# Patient Record
Sex: Male | Born: 1975
Health system: Southern US, Community
[De-identification: ages and names within clinical notes are randomized; demographics above are authoritative.]

## PROBLEM LIST (undated history)

## (undated) DIAGNOSIS — G4730 Sleep apnea, unspecified: Secondary | ICD-10-CM

## (undated) DIAGNOSIS — E669 Obesity, unspecified: Secondary | ICD-10-CM

## (undated) DIAGNOSIS — G4733 Obstructive sleep apnea (adult) (pediatric): Secondary | ICD-10-CM

## (undated) DIAGNOSIS — T7840XA Allergy, unspecified, initial encounter: Secondary | ICD-10-CM

## (undated) DIAGNOSIS — E785 Hyperlipidemia, unspecified: Secondary | ICD-10-CM

## (undated) DIAGNOSIS — R0981 Nasal congestion: Secondary | ICD-10-CM

## (undated) DIAGNOSIS — E039 Hypothyroidism, unspecified: Secondary | ICD-10-CM

## (undated) DIAGNOSIS — F329 Major depressive disorder, single episode, unspecified: Secondary | ICD-10-CM

## (undated) DIAGNOSIS — F32A Depression, unspecified: Secondary | ICD-10-CM

## (undated) DIAGNOSIS — F419 Anxiety disorder, unspecified: Secondary | ICD-10-CM

## (undated) DIAGNOSIS — I10 Essential (primary) hypertension: Secondary | ICD-10-CM

## (undated) DIAGNOSIS — E119 Type 2 diabetes mellitus without complications: Secondary | ICD-10-CM

## (undated) HISTORY — DX: Major depressive disorder, single episode, unspecified: F32.9

## (undated) HISTORY — DX: Sleep apnea, unspecified: G47.30

## (undated) HISTORY — DX: Anxiety disorder, unspecified: F41.9

## (undated) HISTORY — PX: FRACTURE SURGERY: SHX138

## (undated) HISTORY — DX: Depression, unspecified: F32.A

## (undated) HISTORY — DX: Hyperlipidemia, unspecified: E78.5

## (undated) HISTORY — DX: Essential (primary) hypertension: I10

## (undated) HISTORY — DX: Obesity, unspecified: E66.9

## (undated) HISTORY — DX: Obstructive sleep apnea (adult) (pediatric): G47.33

## (undated) HISTORY — PX: UVULOPALATOPHARYNGOPLASTY (UPPP)/TONSILLECTOMY/SEPTOPLASTY: SHX6164

## (undated) HISTORY — DX: Allergy, unspecified, initial encounter: T78.40XA

## (undated) HISTORY — DX: Hypothyroidism, unspecified: E03.9

## (undated) HISTORY — DX: Nasal congestion: R09.81

---
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
## 1999-09-22 ENCOUNTER — Encounter: Admission: RE | Admit: 1999-09-22 | Discharge: 1999-12-21 | Payer: Self-pay | Admitting: *Deleted

## 1999-11-21 ENCOUNTER — Ambulatory Visit (HOSPITAL_COMMUNITY): Admission: RE | Admit: 1999-11-21 | Discharge: 1999-11-21 | Payer: Self-pay | Admitting: Family Medicine

## 1999-11-21 ENCOUNTER — Encounter: Payer: Self-pay | Admitting: Family Medicine

## 2001-04-25 ENCOUNTER — Emergency Department (HOSPITAL_COMMUNITY): Admission: EM | Admit: 2001-04-25 | Discharge: 2001-04-25 | Payer: Self-pay | Admitting: Emergency Medicine

## 2002-08-05 ENCOUNTER — Ambulatory Visit: Admission: RE | Admit: 2002-08-05 | Discharge: 2002-08-05 | Payer: Self-pay | Admitting: Unknown Physician Specialty

## 2004-01-27 ENCOUNTER — Ambulatory Visit: Payer: Self-pay | Admitting: Family Medicine

## 2004-02-26 ENCOUNTER — Ambulatory Visit: Payer: Self-pay | Admitting: Family Medicine

## 2004-03-10 ENCOUNTER — Ambulatory Visit: Payer: Self-pay | Admitting: *Deleted

## 2004-05-02 ENCOUNTER — Ambulatory Visit: Payer: Self-pay | Admitting: Family Medicine

## 2004-07-04 ENCOUNTER — Ambulatory Visit: Payer: Self-pay | Admitting: Family Medicine

## 2004-12-07 ENCOUNTER — Ambulatory Visit: Payer: Self-pay | Admitting: Family Medicine

## 2005-06-09 ENCOUNTER — Ambulatory Visit: Payer: Self-pay | Admitting: Family Medicine

## 2005-06-27 ENCOUNTER — Ambulatory Visit: Payer: Self-pay | Admitting: Family Medicine

## 2005-07-14 ENCOUNTER — Emergency Department (HOSPITAL_COMMUNITY): Admission: EM | Admit: 2005-07-14 | Discharge: 2005-07-14 | Payer: Self-pay | Admitting: Emergency Medicine

## 2005-08-10 ENCOUNTER — Ambulatory Visit: Payer: Self-pay | Admitting: Family Medicine

## 2005-11-29 ENCOUNTER — Emergency Department (HOSPITAL_COMMUNITY): Admission: EM | Admit: 2005-11-29 | Discharge: 2005-11-29 | Payer: Self-pay | Admitting: Emergency Medicine

## 2007-05-17 ENCOUNTER — Ambulatory Visit: Payer: Self-pay | Admitting: Cardiology

## 2008-03-13 HISTORY — PX: UVULOPALATOPHARYNGOPLASTY (UPPP)/TONSILLECTOMY/SEPTOPLASTY: SHX6164

## 2009-12-07 ENCOUNTER — Emergency Department (HOSPITAL_COMMUNITY): Admission: EM | Admit: 2009-12-07 | Discharge: 2009-12-07 | Payer: Self-pay | Admitting: Emergency Medicine

## 2009-12-08 ENCOUNTER — Emergency Department (HOSPITAL_COMMUNITY): Admission: EM | Admit: 2009-12-08 | Discharge: 2009-12-09 | Payer: Self-pay | Admitting: Emergency Medicine

## 2009-12-13 ENCOUNTER — Emergency Department (HOSPITAL_COMMUNITY): Admission: EM | Admit: 2009-12-13 | Discharge: 2009-12-13 | Payer: Self-pay | Admitting: Emergency Medicine

## 2009-12-18 ENCOUNTER — Emergency Department (HOSPITAL_COMMUNITY): Admission: EM | Admit: 2009-12-18 | Discharge: 2009-12-18 | Payer: Self-pay | Admitting: Emergency Medicine

## 2010-03-17 ENCOUNTER — Emergency Department (HOSPITAL_COMMUNITY)
Admission: EM | Admit: 2010-03-17 | Discharge: 2010-03-17 | Payer: Self-pay | Source: Home / Self Care | Admitting: Emergency Medicine

## 2010-03-17 LAB — BASIC METABOLIC PANEL
BUN: 11 mg/dL (ref 6–23)
CO2: 28 mEq/L (ref 19–32)
Calcium: 9.3 mg/dL (ref 8.4–10.5)
Chloride: 102 mEq/L (ref 96–112)
Creatinine, Ser: 0.96 mg/dL (ref 0.4–1.5)
GFR calc Af Amer: >60 mL/min (ref >60)
GFR calc non Af Amer: >60 mL/min (ref >60)
Glucose, Bld: 108 mg/dL — ABNORMAL HIGH (ref 70–99)
Potassium: 4.3 mEq/L (ref 3.5–5.1)
Sodium: 138 mEq/L (ref 135–145)

## 2010-03-17 LAB — URINALYSIS, ROUTINE W REFLEX MICROSCOPIC
Bilirubin Urine: NEGATIVE
Hemoglobin, Urine: NEGATIVE
Ketones, ur: NEGATIVE mg/dL
Nitrite: NEGATIVE
Protein, ur: NEGATIVE mg/dL
Specific Gravity, Urine: 1.015 (ref 1.005–1.030)
Urine Glucose, Fasting: NEGATIVE mg/dL
Urobilinogen, UA: 1 mg/dL (ref 0.0–1.0)
pH: 7.5 (ref 5.0–8.0)

## 2010-05-14 ENCOUNTER — Emergency Department (HOSPITAL_COMMUNITY)
Admission: EM | Admit: 2010-05-14 | Discharge: 2010-05-14 | Disposition: A | Discharge status: Home / Self Care | Payer: BC Managed Care – PPO | Attending: Emergency Medicine | Admitting: Emergency Medicine

## 2010-05-14 DIAGNOSIS — Z79899 Other long term (current) drug therapy: Secondary | ICD-10-CM | POA: Insufficient documentation

## 2010-05-14 DIAGNOSIS — F3289 Other specified depressive episodes: Secondary | ICD-10-CM | POA: Insufficient documentation

## 2010-05-14 DIAGNOSIS — F329 Major depressive disorder, single episode, unspecified: Secondary | ICD-10-CM | POA: Insufficient documentation

## 2010-05-14 DIAGNOSIS — E7800 Pure hypercholesterolemia, unspecified: Secondary | ICD-10-CM | POA: Insufficient documentation

## 2010-05-14 DIAGNOSIS — R55 Syncope and collapse: Secondary | ICD-10-CM | POA: Insufficient documentation

## 2010-05-14 DIAGNOSIS — F411 Generalized anxiety disorder: Secondary | ICD-10-CM | POA: Insufficient documentation

## 2010-05-14 DIAGNOSIS — R42 Dizziness and giddiness: Secondary | ICD-10-CM | POA: Insufficient documentation

## 2010-05-14 DIAGNOSIS — E039 Hypothyroidism, unspecified: Secondary | ICD-10-CM | POA: Insufficient documentation

## 2010-05-14 DIAGNOSIS — I10 Essential (primary) hypertension: Secondary | ICD-10-CM | POA: Insufficient documentation

## 2010-05-14 LAB — POCT I-STAT, CHEM 8
BUN: 15 mg/dL (ref 6–23)
Calcium, Ion: 1.08 mmol/L — ABNORMAL LOW (ref 1.12–1.32)
Chloride: 104 mEq/L (ref 96–112)
Creatinine, Ser: 1.2 mg/dL (ref 0.4–1.5)
Glucose, Bld: 105 mg/dL — ABNORMAL HIGH (ref 70–99)
HCT: 47 % (ref 39.0–52.0)
Hemoglobin: 16 g/dL (ref 13.0–17.0)
Potassium: 3.9 mEq/L (ref 3.5–5.1)
Sodium: 139 mEq/L (ref 135–145)
TCO2: 26 mmol/L (ref 0–100)

## 2010-05-14 LAB — TSH: TSH: 0.831 u[IU]/mL (ref 0.350–4.500)

## 2010-05-26 LAB — POCT CARDIAC MARKERS
CKMB, poc: 1.1 ng/mL (ref 1.0–8.0)
CKMB, poc: <1 ng/mL — ABNORMAL LOW (ref 1.0–8.0)
Myoglobin, poc: 80.1 ng/mL (ref 12–200)
Myoglobin, poc: 80.2 ng/mL (ref 12–200)
Troponin i, poc: <0.05 ng/mL (ref 0.00–0.09)
Troponin i, poc: <0.05 ng/mL (ref 0.00–0.09)

## 2010-05-26 LAB — BASIC METABOLIC PANEL
BUN: 8 mg/dL (ref 6–23)
BUN: 9 mg/dL (ref 6–23)
BUN: 11 mg/dL (ref 6–23)
CO2: 25 mEq/L (ref 19–32)
CO2: 27 mEq/L (ref 19–32)
CO2: 29 mEq/L (ref 19–32)
Calcium: 8.9 mg/dL (ref 8.4–10.5)
Calcium: 9.2 mg/dL (ref 8.4–10.5)
Calcium: 9.4 mg/dL (ref 8.4–10.5)
Chloride: 101 mEq/L (ref 96–112)
Chloride: 103 mEq/L (ref 96–112)
Chloride: 102 mEq/L (ref 96–112)
Creatinine, Ser: 1.02 mg/dL (ref 0.4–1.5)
Creatinine, Ser: 1.13 mg/dL (ref 0.4–1.5)
Creatinine, Ser: 1.09 mg/dL (ref 0.4–1.5)
GFR calc Af Amer: >60 mL/min (ref >60)
GFR calc Af Amer: >60 mL/min (ref >60)
GFR calc Af Amer: >60 mL/min (ref >60)
GFR calc non Af Amer: >60 mL/min (ref >60)
GFR calc non Af Amer: >60 mL/min (ref >60)
GFR calc non Af Amer: >60 mL/min (ref >60)
Glucose, Bld: 105 mg/dL — ABNORMAL HIGH (ref 70–99)
Glucose, Bld: 116 mg/dL — ABNORMAL HIGH (ref 70–99)
Glucose, Bld: 87 mg/dL (ref 70–99)
Potassium: 3.3 mEq/L — ABNORMAL LOW (ref 3.5–5.1)
Potassium: 3.7 mEq/L (ref 3.5–5.1)
Potassium: 3.2 mEq/L — ABNORMAL LOW (ref 3.5–5.1)
Sodium: 137 mEq/L (ref 135–145)
Sodium: 137 mEq/L (ref 135–145)
Sodium: 138 mEq/L (ref 135–145)

## 2010-05-26 LAB — CBC
HCT: 43 % (ref 39.0–52.0)
HCT: 44.2 % (ref 39.0–52.0)
Hemoglobin: 14.5 g/dL (ref 13.0–17.0)
Hemoglobin: 14.7 g/dL (ref 13.0–17.0)
MCH: 27 pg (ref 26.0–34.0)
MCH: 27.4 pg (ref 26.0–34.0)
MCHC: 33.3 g/dL (ref 30.0–36.0)
MCHC: 33.6 g/dL (ref 30.0–36.0)
MCV: 81.1 fL (ref 78.0–100.0)
MCV: 81.4 fL (ref 78.0–100.0)
Platelets: 316 10*3/uL (ref 150–400)
Platelets: 325 10*3/uL (ref 150–400)
RBC: 5.28 MIL/uL (ref 4.22–5.81)
RBC: 5.45 MIL/uL (ref 4.22–5.81)
RDW: 14.4 % (ref 11.5–15.5)
RDW: 14.5 % (ref 11.5–15.5)
WBC: 6.6 10*3/uL (ref 4.0–10.5)
WBC: 7.7 10*3/uL (ref 4.0–10.5)

## 2010-05-26 LAB — URINALYSIS, ROUTINE W REFLEX MICROSCOPIC
Bilirubin Urine: NEGATIVE
Glucose, UA: NEGATIVE mg/dL
Hgb urine dipstick: NEGATIVE
Nitrite: NEGATIVE
Protein, ur: NEGATIVE mg/dL
Specific Gravity, Urine: 1.02 (ref 1.005–1.030)
Urobilinogen, UA: 0.2 mg/dL (ref 0.0–1.0)
pH: 6 (ref 5.0–8.0)

## 2010-05-26 LAB — DIFFERENTIAL
Basophils Absolute: 0 10*3/uL (ref 0.0–0.1)
Basophils Absolute: 0 10*3/uL (ref 0.0–0.1)
Basophils Relative: 0 % (ref 0–1)
Basophils Relative: 0 % (ref 0–1)
Eosinophils Absolute: 0 10*3/uL (ref 0.0–0.7)
Eosinophils Absolute: 0.1 10*3/uL (ref 0.0–0.7)
Eosinophils Relative: 1 % (ref 0–5)
Eosinophils Relative: 1 % (ref 0–5)
Lymphocytes Relative: 28 % (ref 12–46)
Lymphocytes Relative: 29 % (ref 12–46)
Lymphs Abs: 1.9 10*3/uL (ref 0.7–4.0)
Lymphs Abs: 2.2 10*3/uL (ref 0.7–4.0)
Monocytes Absolute: 0.6 10*3/uL (ref 0.1–1.0)
Monocytes Absolute: 0.8 10*3/uL (ref 0.1–1.0)
Monocytes Relative: 10 % (ref 3–12)
Monocytes Relative: 9 % (ref 3–12)
Neutro Abs: 4 10*3/uL (ref 1.7–7.7)
Neutro Abs: 4.7 10*3/uL (ref 1.7–7.7)
Neutrophils Relative %: 61 % (ref 43–77)
Neutrophils Relative %: 61 % (ref 43–77)

## 2010-05-26 LAB — POCT I-STAT, CHEM 8
BUN: 13 mg/dL (ref 6–23)
Calcium, Ion: 1.05 mmol/L — ABNORMAL LOW (ref 1.12–1.32)
Chloride: 104 mEq/L (ref 96–112)
Creatinine, Ser: 1.2 mg/dL (ref 0.4–1.5)
Glucose, Bld: 103 mg/dL — ABNORMAL HIGH (ref 70–99)
HCT: 48 % (ref 39.0–52.0)
Hemoglobin: 16.3 g/dL (ref 13.0–17.0)
Potassium: 3.9 mEq/L (ref 3.5–5.1)
Sodium: 137 mEq/L (ref 135–145)
TCO2: 28 mmol/L (ref 0–100)

## 2010-05-26 LAB — RAPID URINE DRUG SCREEN, HOSP PERFORMED
Amphetamines: NOT DETECTED
Barbiturates: NOT DETECTED
Benzodiazepines: NOT DETECTED
Cocaine: NOT DETECTED
Opiates: NOT DETECTED
Tetrahydrocannabinol: NOT DETECTED

## 2010-05-26 LAB — D-DIMER, QUANTITATIVE: D-Dimer, Quant: 0.22 ug/mL-FEU (ref 0.00–0.48)

## 2010-06-07 ENCOUNTER — Emergency Department (HOSPITAL_COMMUNITY)
Admission: EM | Admit: 2010-06-07 | Discharge: 2010-06-07 | Disposition: A | Discharge status: Home / Self Care | Payer: BC Managed Care – PPO | Attending: Emergency Medicine | Admitting: Emergency Medicine

## 2010-06-07 DIAGNOSIS — G4700 Insomnia, unspecified: Secondary | ICD-10-CM | POA: Insufficient documentation

## 2010-06-07 DIAGNOSIS — R5381 Other malaise: Secondary | ICD-10-CM | POA: Insufficient documentation

## 2010-06-07 DIAGNOSIS — R5383 Other fatigue: Secondary | ICD-10-CM | POA: Insufficient documentation

## 2010-06-07 LAB — URINALYSIS, ROUTINE W REFLEX MICROSCOPIC
Bilirubin Urine: NEGATIVE
Glucose, UA: NEGATIVE mg/dL
Hgb urine dipstick: NEGATIVE
Ketones, ur: NEGATIVE mg/dL
Leukocytes, UA: NEGATIVE
Nitrite: NEGATIVE
Specific Gravity, Urine: 1.01 (ref 1.005–1.030)
Urobilinogen, UA: 1 mg/dL (ref 0.0–1.0)
pH: 6.5 (ref 5.0–8.0)

## 2010-06-07 LAB — COMPREHENSIVE METABOLIC PANEL
ALT: 30 U/L (ref 0–53)
AST: 24 U/L (ref 0–37)
Albumin: 4 g/dL (ref 3.5–5.2)
Alkaline Phosphatase: 56 U/L (ref 39–117)
BUN: 11 mg/dL (ref 6–23)
CO2: 27 mEq/L (ref 19–32)
Calcium: 9.4 mg/dL (ref 8.4–10.5)
Chloride: 102 mEq/L (ref 96–112)
Creatinine, Ser: 1.06 mg/dL (ref 0.4–1.5)
GFR calc Af Amer: >60 mL/min (ref >60)
GFR calc non Af Amer: >60 mL/min (ref >60)
Glucose, Bld: 104 mg/dL — ABNORMAL HIGH (ref 70–99)
Potassium: 4.1 mEq/L (ref 3.5–5.1)
Sodium: 138 mEq/L (ref 135–145)
Total Bilirubin: 0.7 mg/dL (ref 0.3–1.2)
Total Protein: 7.6 g/dL (ref 6.0–8.3)

## 2010-06-07 LAB — URINE MICROSCOPIC-ADD ON

## 2010-06-07 LAB — LIPASE, BLOOD: Lipase: 24 U/L (ref 11–59)

## 2010-08-14 ENCOUNTER — Emergency Department (HOSPITAL_COMMUNITY): Payer: Self-pay

## 2010-08-14 ENCOUNTER — Emergency Department (HOSPITAL_COMMUNITY)
Admission: EM | Admit: 2010-08-14 | Discharge: 2010-08-14 | Disposition: A | Discharge status: Home / Self Care | Payer: Self-pay | Attending: Emergency Medicine | Admitting: Emergency Medicine

## 2010-08-14 DIAGNOSIS — I10 Essential (primary) hypertension: Secondary | ICD-10-CM | POA: Insufficient documentation

## 2010-08-14 DIAGNOSIS — M545 Low back pain, unspecified: Secondary | ICD-10-CM | POA: Insufficient documentation

## 2010-08-14 DIAGNOSIS — R5381 Other malaise: Secondary | ICD-10-CM | POA: Insufficient documentation

## 2010-08-14 DIAGNOSIS — M542 Cervicalgia: Secondary | ICD-10-CM | POA: Insufficient documentation

## 2010-08-14 DIAGNOSIS — R079 Chest pain, unspecified: Secondary | ICD-10-CM | POA: Insufficient documentation

## 2010-08-14 DIAGNOSIS — F411 Generalized anxiety disorder: Secondary | ICD-10-CM | POA: Insufficient documentation

## 2010-08-14 DIAGNOSIS — M79609 Pain in unspecified limb: Secondary | ICD-10-CM | POA: Insufficient documentation

## 2010-08-14 LAB — URINALYSIS, ROUTINE W REFLEX MICROSCOPIC
Glucose, UA: NEGATIVE mg/dL
Ketones, ur: NEGATIVE mg/dL
Nitrite: NEGATIVE
Specific Gravity, Urine: 1.025 (ref 1.005–1.030)
pH: 6 (ref 5.0–8.0)

## 2010-08-14 LAB — COMPREHENSIVE METABOLIC PANEL
AST: 22 U/L (ref 0–37)
Albumin: 3.9 g/dL (ref 3.5–5.2)
Alkaline Phosphatase: 60 U/L (ref 39–117)
BUN: 13 mg/dL (ref 6–23)
Chloride: 104 mEq/L (ref 96–112)
GFR calc Af Amer: >60 mL/min (ref >60)
Potassium: 4.3 mEq/L (ref 3.5–5.1)
Total Bilirubin: 0.3 mg/dL (ref 0.3–1.2)
Total Protein: 7.4 g/dL (ref 6.0–8.3)

## 2010-08-14 LAB — DIFFERENTIAL
Basophils Absolute: 0 10*3/uL (ref 0.0–0.1)
Basophils Relative: 1 % (ref 0–1)
Eosinophils Absolute: 0.1 10*3/uL (ref 0.0–0.7)
Monocytes Relative: 12 % (ref 3–12)
Neutro Abs: 2.4 10*3/uL (ref 1.7–7.7)
Neutrophils Relative %: 50 % (ref 43–77)

## 2010-08-14 LAB — CBC
Hemoglobin: 14.7 g/dL (ref 13.0–17.0)
Platelets: 287 10*3/uL (ref 150–400)
RBC: 5.39 MIL/uL (ref 4.22–5.81)
WBC: 4.8 10*3/uL (ref 4.0–10.5)

## 2010-08-17 ENCOUNTER — Encounter: Payer: Self-pay | Admitting: Physician Assistant

## 2010-09-09 ENCOUNTER — Emergency Department (HOSPITAL_COMMUNITY)
Admission: EM | Admit: 2010-09-09 | Discharge: 2010-09-09 | Disposition: A | Discharge status: Home / Self Care | Payer: Self-pay | Attending: Emergency Medicine | Admitting: Emergency Medicine

## 2010-09-09 ENCOUNTER — Emergency Department (HOSPITAL_COMMUNITY): Payer: Self-pay

## 2010-09-09 DIAGNOSIS — F3289 Other specified depressive episodes: Secondary | ICD-10-CM | POA: Insufficient documentation

## 2010-09-09 DIAGNOSIS — J3489 Other specified disorders of nose and nasal sinuses: Secondary | ICD-10-CM | POA: Insufficient documentation

## 2010-09-09 DIAGNOSIS — Z79899 Other long term (current) drug therapy: Secondary | ICD-10-CM | POA: Insufficient documentation

## 2010-09-09 DIAGNOSIS — E039 Hypothyroidism, unspecified: Secondary | ICD-10-CM | POA: Insufficient documentation

## 2010-09-09 DIAGNOSIS — F329 Major depressive disorder, single episode, unspecified: Secondary | ICD-10-CM | POA: Insufficient documentation

## 2010-09-09 DIAGNOSIS — E7800 Pure hypercholesterolemia, unspecified: Secondary | ICD-10-CM | POA: Insufficient documentation

## 2010-09-09 DIAGNOSIS — F411 Generalized anxiety disorder: Secondary | ICD-10-CM | POA: Insufficient documentation

## 2010-09-09 DIAGNOSIS — K219 Gastro-esophageal reflux disease without esophagitis: Secondary | ICD-10-CM | POA: Insufficient documentation

## 2010-09-09 DIAGNOSIS — I10 Essential (primary) hypertension: Secondary | ICD-10-CM | POA: Insufficient documentation

## 2010-09-09 DIAGNOSIS — R51 Headache: Secondary | ICD-10-CM | POA: Insufficient documentation

## 2010-09-09 LAB — BASIC METABOLIC PANEL
BUN: 14 mg/dL (ref 6–23)
CO2: 28 mEq/L (ref 19–32)
GFR calc non Af Amer: >60 mL/min (ref >60)
Glucose, Bld: 103 mg/dL — ABNORMAL HIGH (ref 70–99)
Potassium: 3.8 mEq/L (ref 3.5–5.1)
Sodium: 138 mEq/L (ref 135–145)

## 2011-09-26 ENCOUNTER — Encounter (HOSPITAL_COMMUNITY): Payer: Self-pay | Admitting: Cardiology

## 2011-09-26 ENCOUNTER — Encounter (HOSPITAL_COMMUNITY)
Admission: RE | Disposition: A | Discharge status: Home / Self Care | Payer: Self-pay | Source: Ambulatory Visit | Attending: Cardiology

## 2011-09-26 ENCOUNTER — Ambulatory Visit (HOSPITAL_COMMUNITY)
Admission: RE | Admit: 2011-09-26 | Discharge: 2011-09-26 | Disposition: A | Discharge status: Home / Self Care | Payer: 59 | Source: Ambulatory Visit | Attending: Cardiology | Admitting: Cardiology

## 2011-09-26 DIAGNOSIS — R9439 Abnormal result of other cardiovascular function study: Secondary | ICD-10-CM | POA: Insufficient documentation

## 2011-09-26 DIAGNOSIS — R0609 Other forms of dyspnea: Secondary | ICD-10-CM | POA: Insufficient documentation

## 2011-09-26 DIAGNOSIS — R0989 Other specified symptoms and signs involving the circulatory and respiratory systems: Secondary | ICD-10-CM | POA: Insufficient documentation

## 2011-09-26 HISTORY — PX: LEFT HEART CATHETERIZATION WITH CORONARY ANGIOGRAM: SHX5451

## 2011-09-26 LAB — GLUCOSE, CAPILLARY: Glucose-Capillary: 84 mg/dL (ref 70–99)

## 2011-09-26 SURGERY — LEFT HEART CATHETERIZATION WITH CORONARY ANGIOGRAM
Anesthesia: LOCAL

## 2011-09-26 MED ORDER — VERAPAMIL HCL 2.5 MG/ML IV SOLN
INTRAVENOUS | Status: AC
  Filled 2011-09-26: qty 2

## 2011-09-26 MED ORDER — SODIUM CHLORIDE 0.9 % IV SOLN: 1.0000 mL/kg/h | INTRAVENOUS | Status: DC

## 2011-09-26 MED ORDER — NITROGLYCERIN 0.2 MG/ML ON CALL CATH LAB
INTRAVENOUS | Status: AC
  Filled 2011-09-26: qty 1

## 2011-09-26 MED ORDER — ONDANSETRON HCL 4 MG/2ML IJ SOLN: 4.0000 mg | Freq: Four times a day (QID) | INTRAMUSCULAR | Status: DC | PRN

## 2011-09-26 MED ORDER — MIDAZOLAM HCL 2 MG/2ML IJ SOLN
INTRAMUSCULAR | Status: AC
  Filled 2011-09-26: qty 2

## 2011-09-26 MED ORDER — SODIUM CHLORIDE 0.9 % IJ SOLN: 3.0000 mL | INTRAMUSCULAR | Status: DC | PRN

## 2011-09-26 MED ORDER — LIDOCAINE HCL (PF) 1 % IJ SOLN
INTRAMUSCULAR | Status: AC
  Filled 2011-09-26: qty 30

## 2011-09-26 MED ORDER — SODIUM CHLORIDE 0.9 % IJ SOLN: 3.0000 mL | Freq: Two times a day (BID) | INTRAMUSCULAR | Status: DC

## 2011-09-26 MED ORDER — SODIUM CHLORIDE 0.9 % IV SOLN
INTRAVENOUS | Status: DC
  Administered 2011-09-26: 1000 mL via INTRAVENOUS

## 2011-09-26 MED ORDER — HYDROMORPHONE HCL PF 2 MG/ML IJ SOLN
INTRAMUSCULAR | Status: AC
  Filled 2011-09-26: qty 1

## 2011-09-26 MED ORDER — HEPARIN (PORCINE) IN NACL 2-0.9 UNIT/ML-% IJ SOLN
INTRAMUSCULAR | Status: AC
  Filled 2011-09-26: qty 1000

## 2011-09-26 MED ORDER — ASPIRIN 81 MG PO CHEW
324.0000 mg | CHEWABLE_TABLET | ORAL | Status: AC
  Administered 2011-09-26: 324 mg via ORAL
  Filled 2011-09-26: qty 4

## 2011-09-26 MED ORDER — SODIUM CHLORIDE 0.9 % IV SOLN: 250.0000 mL | INTRAVENOUS | Status: DC | PRN

## 2011-09-26 MED ORDER — METFORMIN HCL 500 MG PO TABS: 500.0000 mg | ORAL_TABLET | Freq: Every day | ORAL | Status: DC

## 2011-09-26 MED ORDER — ACETAMINOPHEN 325 MG PO TABS: 650.0000 mg | ORAL_TABLET | ORAL | Status: DC | PRN

## 2011-09-26 MED ORDER — HEPARIN SODIUM (PORCINE) 1000 UNIT/ML IJ SOLN
INTRAMUSCULAR | Status: AC
  Filled 2011-09-26: qty 1

## 2011-09-26 NOTE — H&P (Signed)
  Please see paper chart  

## 2011-09-26 NOTE — CV Procedure (Signed)
Procedure performed:  Left heart catheterization including hemodynamic monitoring of the left ventricle, selective right and left coronary arteriography.  Indication patient is a 36 year-old man with history of hypertension,  hyperlipidemia,  Diabetes Mellitus   who presents with dyspnea on exertion. Patient has  had non invasive testing which was abnormal revealing possible anterolateral ischemia  Hence is brought to the cardiac catheterization lab to evaluate her coronary anatomy for definitive diagnosis of CAD.  Hemodynamic data:  Left ventricular pressure was 135/4 with LVEDP of 8 mm mercury. Aortic pressure was 139/90 with a mean of 133 mm mercury.  Left ventricle: Performed in the RAO projection revealed LVEF of 60%. There was no MR. No Wall motion abnormality.  Right coronary artery: The vessel is smooth, normal,  Dominant.  Left main coronary artery is large and normal.  Circumflex coronary artery: A large vessel giving origin to a large obtuse marginal 1.   LAD:  LAD gives origin to a large diagonal-1.  LAD has no luminal irregularities. The D1 is soft is LAD equivalent. It is much larger than the LAD itself. LAD ends before reaching the apex.  Impression: False-positive stress test revealing anterior wall ischemia. This could of been related to his body habitus and also much smaller LAD and the larger D1.  Technique: Under sterile precautions using a 6 French right radial  arterial access, a 6 French sheath was introduced into the right radial artery. A 5 [place] Tig 4 catheter was advanced into the ascending aorta selective  right coronary artery and left coronary artery was cannulated and angiography was performed in multiple views. The catheter was pulled back Out of the body over exchange length J-wire.  Same catheter was used to perform LV gram which was performed in LAO projection.  Catheter exchanged out of the body over J-Wire. NO immediate complications noted. Patient  tolerated the procedure well.   Rec: Medical therapy with aggressive risk factor reduction.   Disposition: Will be discharged home today with outpatient follow up.

## 2011-09-26 NOTE — Discharge Instructions (Signed)
Radial Site Care Refer to this sheet in the next few weeks. These instructions provide you with information on caring for yourself after your procedure. Your caregiver may also give you more specific instructions. Your treatment has been planned according to current medical practices, but problems sometimes occur. Call your caregiver if you have any problems or questions after your procedure. HOME CARE INSTRUCTIONS  You may shower the day after the procedure.Remove the bandage (dressing) and gently wash the site with plain soap and water.Gently pat the site dry.   Do not apply powder or lotion to the site.   Do not submerge the affected site in water for 3 to 5 days.   Inspect the site at least twice daily.   Do not flex or bend the affected arm for 24 hours.   No lifting over 5 pounds (2.3 kg) for 5 days after your procedure.   Do not drive home if you are discharged the same day of the procedure. Have someone else drive you.   You may drive 24 hours after the procedure unless otherwise instructed by your caregiver.   Do not operate machinery or power tools for 24 hours.   A responsible adult should be with you for the first 24 hours after you arrive home.  What to expect:  Any bruising will usually fade within 1 to 2 weeks.   Blood that collects in the tissue (hematoma) may be painful to the touch. It should usually decrease in size and tenderness within 1 to 2 weeks.  SEEK IMMEDIATE MEDICAL CARE IF:  You have unusual pain at the radial site.   You have redness, warmth, swelling, or pain at the radial site.   You have drainage (other than a small amount of blood on the dressing).   You have chills.   You have a fever or persistent symptoms for more than 72 hours.   You have a fever and your symptoms suddenly get worse.   Your arm becomes pale, cool, tingly, or numb.   You have heavy bleeding from the site. Hold pressure on the site.  Document Released: 04/01/2010  Document Revised: 02/16/2011 Document Reviewed: 04/01/2010 [employer] Patient Information 2012 [employer]. 

## 2011-09-26 NOTE — Interval H&P Note (Signed)
History and Physical Interval Note:  09/26/2011 11:10 AM  [name]  has presented today for surgery, with the diagnosis of c/p  The various methods of treatment have been discussed with the patient and family. After consideration of risks, benefits and other options for treatment, the patient has consented to  Procedure(s) (LRB): LEFT HEART CATHETERIZATION WITH CORONARY ANGIOGRAM (N/A) and possible angioplasty as a surgical intervention .  The patient's history has been reviewed, patient examined, no change in status, stable for surgery.  I have reviewed the patients' chart and labs.  Questions were answered to the patient's satisfaction.     Pamella Pert

## 2012-06-03 ENCOUNTER — Other Ambulatory Visit: Payer: Self-pay | Admitting: Physician Assistant

## 2012-06-03 DIAGNOSIS — E119 Type 2 diabetes mellitus without complications: Secondary | ICD-10-CM

## 2012-06-03 MED ORDER — METFORMIN HCL ER 500 MG PO TB24: 500.0000 mg | ORAL_TABLET | Freq: Every day | ORAL | Status: DC

## 2012-08-14 ENCOUNTER — Other Ambulatory Visit: Payer: Self-pay | Admitting: Family Medicine

## 2012-09-24 ENCOUNTER — Encounter: Payer: Self-pay | Admitting: Family Medicine

## 2012-09-24 ENCOUNTER — Ambulatory Visit (INDEPENDENT_AMBULATORY_CARE_PROVIDER_SITE_OTHER): Payer: 59 | Admitting: Family Medicine

## 2012-09-24 ENCOUNTER — Telehealth: Payer: Self-pay | Admitting: Family Medicine

## 2012-09-24 VITALS — BP 146/94 | HR 97 | Temp 98.5°F | Wt 316.8 lb

## 2012-09-24 DIAGNOSIS — F4323 Adjustment disorder with mixed anxiety and depressed mood: Secondary | ICD-10-CM

## 2012-09-24 DIAGNOSIS — F329 Major depressive disorder, single episode, unspecified: Secondary | ICD-10-CM | POA: Insufficient documentation

## 2012-09-24 DIAGNOSIS — M545 Low back pain, unspecified: Secondary | ICD-10-CM | POA: Insufficient documentation

## 2012-09-24 DIAGNOSIS — I10 Essential (primary) hypertension: Secondary | ICD-10-CM | POA: Insufficient documentation

## 2012-09-24 DIAGNOSIS — E119 Type 2 diabetes mellitus without complications: Secondary | ICD-10-CM

## 2012-09-24 DIAGNOSIS — E785 Hyperlipidemia, unspecified: Secondary | ICD-10-CM | POA: Insufficient documentation

## 2012-09-24 DIAGNOSIS — J019 Acute sinusitis, unspecified: Secondary | ICD-10-CM

## 2012-09-24 DIAGNOSIS — E669 Obesity, unspecified: Secondary | ICD-10-CM

## 2012-09-24 DIAGNOSIS — M549 Dorsalgia, unspecified: Secondary | ICD-10-CM

## 2012-09-24 DIAGNOSIS — R1011 Right upper quadrant pain: Secondary | ICD-10-CM | POA: Insufficient documentation

## 2012-09-24 DIAGNOSIS — F419 Anxiety disorder, unspecified: Secondary | ICD-10-CM | POA: Insufficient documentation

## 2012-09-24 DIAGNOSIS — E039 Hypothyroidism, unspecified: Secondary | ICD-10-CM | POA: Insufficient documentation

## 2012-09-24 DIAGNOSIS — F32A Depression, unspecified: Secondary | ICD-10-CM | POA: Insufficient documentation

## 2012-09-24 LAB — POCT GLYCOSYLATED HEMOGLOBIN (HGB A1C): Hemoglobin A1C: 6.3

## 2012-09-24 LAB — POCT URINALYSIS DIPSTICK
Bilirubin, UA: NEGATIVE
Glucose, UA: NEGATIVE
Leukocytes, UA: NEGATIVE
Nitrite, UA: NEGATIVE
Spec Grav, UA: 1.015
Urobilinogen, UA: NEGATIVE
pH, UA: 6.5

## 2012-09-24 LAB — POCT CBC
Granulocyte percent: 59.9 %G (ref 37–80)
HCT, POC: 41.8 % — AB (ref 43.5–53.7)
Hemoglobin: 14.7 g/dL (ref 14.1–18.1)
Lymph, poc: 2.3 (ref 0.6–3.4)
MCH, POC: 28.4 pg (ref 27–31.2)
MCHC: 35.2 g/dL (ref 31.8–35.4)
MCV: 80.8 fL (ref 80–97)
MPV: 7.8 fL (ref 0–99.8)
POC Granulocyte: 4.1 (ref 2–6.9)
POC LYMPH PERCENT: 33.6 %L (ref 10–50)
Platelet Count, POC: 298 10*3/uL (ref 142–424)
RBC: 5.2 M/uL (ref 4.69–6.13)
RDW, POC: 14.1 %
WBC: 6.8 10*3/uL (ref 4.6–10.2)

## 2012-09-24 LAB — POCT UA - MICROALBUMIN: Microalbumin Ur, POC: POSITIVE mg/L

## 2012-09-24 MED ORDER — ATORVASTATIN CALCIUM 20 MG PO TABS: 20.0000 mg | ORAL_TABLET | Freq: Every day | ORAL | Status: DC

## 2012-09-24 MED ORDER — FLUTICASONE PROPIONATE 50 MCG/ACT NA SUSP: 2.0000 | Freq: Every day | NASAL | Status: DC | PRN

## 2012-09-24 MED ORDER — CEFDINIR 300 MG PO CAPS: 300.0000 mg | ORAL_CAPSULE | Freq: Two times a day (BID) | ORAL | Status: DC

## 2012-09-24 MED ORDER — PANTOPRAZOLE SODIUM 40 MG PO TBEC: 40.0000 mg | DELAYED_RELEASE_TABLET | Freq: Every day | ORAL | Status: DC

## 2012-09-24 NOTE — Telephone Encounter (Signed)
APPT MADE

## 2012-09-24 NOTE — Patient Instructions (Signed)
      Dr Mar Zettler's Recommendations  Diet and Exercise discussed with patient.  For nutrition information, I recommend books:  1).Eat to Live by Dr Joel Fuhrman. 2).Prevent and Reverse Heart Disease by Dr Caldwell Esselstyn. 3) Dr Neal Barnard's Book:  Program to Reverse Diabetes  Exercise recommendations are:  If unable to walk, then the patient can exercise in a chair 3 times a day. By flapping arms like a bird gently and raising legs outwards to the front.  If ambulatory, the patient can go for walks for 30 minutes 3 times a week. Then increase the intensity and duration as tolerated.  Goal is to try to attain exercise frequency to 5 times a week.  If applicable: Best to perform resistance exercises (machines or weights) 2 days a week and cardio type exercises 3 days per week.  

## 2012-09-24 NOTE — Progress Notes (Signed)
Patient ID: [name], male   DOB: August 29, 1975, 37 y.o.   MRN: [id number] SUBJECTIVE: CC: Chief Complaint  Patient presents with  . Acute Visit    abd pain x 3 days .states when eats stomach makes rumbling sounds . eats late at nite  some consipation . stuffy nose congestion .  Marland Kitchen Palpitations    ongoing saw dr Anselm Jungling [phone] cardiac workup thinks its anxiety  . Back Pain         low back pain chronic    HPI: Has gas and RUQ pain sometimes when he eats. Also has backache fromlifting and  Delivering furniture. Head stuffed up and pressure in the cheeks. Runny noses.  Past Medical History  Diagnosis Date  . Hypertension   . Depression   . Anxiety   . Hyperlipidemia   . Hypothyroidism   . Sinus congestion   . Obesity    Past Surgical History  Procedure Laterality Date  . Hernia repair  1990    Right ingruial & umbilical [place]   . Repair right arm fracture  1996    [place]   . Tonsillectomy    . Fracture surgery      right arch  . Tosillectomy    . Septal deviation     History   Social History  . Marital Status: Married    Spouse Name: N/A    Number of Children: N/A  . Years of Education: N/A   Occupational History  . Not on file.   Social History Main Topics  . Smoking status: Never Smoker   . Smokeless tobacco: Not on file  . Alcohol Use: Not on file  . Drug Use: Not on file  . Sexually Active: Not on file   Other Topics Concern  . Not on file   Social History Narrative  . No narrative on file   Family History  Problem Relation Age of Onset  . Hypertension Mother   . Hypertension Father   . Heart attack Father   . Down syndrome Brother   . Breast cancer Paternal Grandmother   . Kidney disease Maternal Grandfather    Current Outpatient Prescriptions on File Prior to Visit  Medication Sig Dispense Refill  . amLODipine (NORVASC) 10 MG tablet TAKE ONE TABLET BY MOUTH EVERY DAY  30 tablet  2  . aspirin 81 MG chewable tablet Chew 81 mg by  mouth daily.      . citalopram (CELEXA) 40 MG tablet Take 40 mg by mouth daily.      . hydrochlorothiazide (HYDRODIURIL) 12.5 MG tablet Take 12.5 mg by mouth daily.      Marland Kitchen levothyroxine (SYNTHROID, LEVOTHROID) 75 MCG tablet Take 75 mcg by mouth daily.        Marland Kitchen lisinopril (PRINIVIL,ZESTRIL) 10 MG tablet Take 10 mg by mouth daily.        . metFORMIN (GLUCOPHAGE XR) 500 MG 24 hr tablet Take 1 tablet (500 mg total) by mouth daily with breakfast.  30 tablet  11  . sodium chloride (OCEAN) 0.65 % nasal spray Place 1 spray into the nose as needed. Nasal dryness       No current facility-administered medications on file prior to visit.   Allergies  Allergen Reactions  . Cymbalta (Duloxetine Hcl) Rash    There is no immunization history on file for this patient. Prior to Admission medications   Medication Sig Start Date End Date Taking? Authorizing Provider  amLODipine (NORVASC) 10 MG tablet TAKE ONE  TABLET BY MOUTH EVERY DAY 08/14/12  Yes Ernestina Penna, MD  aspirin 81 MG chewable tablet Chew 81 mg by mouth daily.   Yes Historical Provider, MD  citalopram (CELEXA) 40 MG tablet Take 40 mg by mouth daily.   Yes Historical Provider, MD  hydrochlorothiazide (HYDRODIURIL) 12.5 MG tablet Take 12.5 mg by mouth daily.   Yes Historical Provider, MD  levothyroxine (SYNTHROID, LEVOTHROID) 75 MCG tablet Take 75 mcg by mouth daily.     Yes Historical Provider, MD  lisinopril (PRINIVIL,ZESTRIL) 10 MG tablet Take 10 mg by mouth daily.     Yes Historical Provider, MD  metFORMIN (GLUCOPHAGE XR) 500 MG 24 hr tablet Take 1 tablet (500 mg total) by mouth daily with breakfast. 06/03/12  Yes Horald Pollen, PA-C  atorvastatin (LIPITOR) 20 MG tablet Take 1 tablet (20 mg total) by mouth daily. 09/24/12   Ileana Ladd, MD  cefdinir (OMNICEF) 300 MG capsule Take 1 capsule (300 mg total) by mouth 2 (two) times daily. 09/24/12   Ileana Ladd, MD  fluticasone (FLONASE) 50 MCG/ACT nasal spray Place 2 sprays into the nose daily  as needed. Nasal congestion 09/24/12   Ileana Ladd, MD  pantoprazole (PROTONIX) 40 MG tablet Take 1 tablet (40 mg total) by mouth daily. 09/24/12   Ileana Ladd, MD  sodium chloride (OCEAN) 0.65 % nasal spray Place 1 spray into the nose as needed. Nasal dryness    Historical Provider, MD    ROS: As above in the HPI. All other systems are stable or negative.  OBJECTIVE: APPEARANCE:  Patient in no acute distress.The patient appeared well nourished and normally developed. Acyanotic. Waist: VITAL SIGNS:BP 146/94  Pulse 97  Temp(Src) 98.5 F (36.9 C) (Oral)  Wt 316 lb 12.8 oz (143.7 kg)  BMI 42.96 kg/m2 AAM  SKIN: warm and  Dry without overt rashes, tattoos and scars  HEAD and Neck: without JVD, Head and scalp: normal Eyes:No scleral icterus. Fundi normal, eye movements normal. Ears: Auricle normal, canal normal, Tympanic membranes normal, insufflation normal. Nose: congested with pressure in the paranasal sinus area with mild tenderness. Throat: normal Neck & thyroid: normal  CHEST & LUNGS: Chest wall: normal Lungs: Clear  CVS: Reveals the PMI to be normally located. Regular rhythm, First and Second Heart sounds are normal,  absence of murmurs, rubs or gallops. Peripheral vasculature: Radial pulses: normal Dorsal pedis pulses: normal Posterior pulses: normal  ABDOMEN:  Appearance: normal Benign, no organomegaly, no masses, no Abdominal Aortic enlargement. No Guarding , no rebound. No Bruits. Bowel sounds: normal  RECTAL: N/A GU: N/A  EXTREMETIES: nonedematous. Both Femoral and Pedal pulses are normal.  MUSCULOSKELETAL:  Spine: normal Joints: intact  NEUROLOGIC: oriented to time,place and person; nonfocal. Strength is normal Sensory is normal Reflexes are normal Cranial Nerves are normal.  ASSESSMENT: HTN (hypertension)  HLD (hyperlipidemia) - Plan: NMR Lipoprofile with Lipids  Unspecified hypothyroidism - Plan: TSH, atorvastatin (LIPITOR) 20 MG  tablet  Obesity, unspecified  DM (diabetes mellitus) - Plan: POCT glycosylated hemoglobin (Hb A1C), POCT urinalysis dipstick, POCT UA - Microalbumin, Microalbumin, urine  Adjustment disorder with mixed anxiety and depressed mood  Sinusitis, acute - Plan: cefdinir (OMNICEF) 300 MG capsule, fluticasone (FLONASE) 50 MCG/ACT nasal spray  Abdominal pain, right upper quadrant - Plan: POCT CBC, COMPLETE METABOLIC PANEL WITH GFR, Amylase, Lipase, Helicobacter pylori abs-IgG+IgA, bld, US Abdomen Limited RUQ, pantoprazole (PROTONIX) 40 MG tablet  Back pain    PLAN: Orders Placed This Encounter  Procedures  .  US Abdomen Limited RUQ    Standing Status: Future     Number of Occurrences:      Standing Expiration Date: 11/25/2013    Order Specific Question:  Reason for Exam (SYMPTOM  OR DIAGNOSIS REQUIRED)    Answer:  RUQ abdominal pain. obesity, DM, flatulence.    Order Specific Question:  Preferred imaging location?    Answer:  [facility]  . COMPLETE METABOLIC PANEL WITH GFR  . NMR Lipoprofile with Lipids  . Amylase  . Lipase  . Helicobacter pylori abs-IgG+IgA, bld  . TSH  . Microalbumin, urine  . POCT CBC  . POCT glycosylated hemoglobin (Hb A1C)  . POCT urinalysis dipstick  . POCT UA - Microalbumin   Meds ordered this encounter  Medications  . DISCONTD: rosuvastatin (CRESTOR) 10 MG tablet    Sig: Take 10 mg by mouth daily.  Marland Kitchen atorvastatin (LIPITOR) 20 MG tablet    Sig: Take 1 tablet (20 mg total) by mouth daily.    Dispense:  30 tablet    Refill:  3  . cefdinir (OMNICEF) 300 MG capsule    Sig: Take 1 capsule (300 mg total) by mouth 2 (two) times daily.    Dispense:  20 capsule    Refill:  0  . pantoprazole (PROTONIX) 40 MG tablet    Sig: Take 1 tablet (40 mg total) by mouth daily.    Dispense:  30 tablet    Refill:  3  . fluticasone (FLONASE) 50 MCG/ACT nasal spray    Sig: Place 2 sprays into the nose daily as needed. Nasal congestion    Dispense:  16 g     Refill:  3   Return in about 2 weeks (around 10/08/2012) for Recheck medical problems.  Sheyna Pettibone P. [place] Charon, M.D.       Dr Woodroe Mode Recommendations  Diet and Exercise discussed with patient.  For nutrition information, I recommend books:  1).Eat to Live by Dr Monico Hoar. 2).Prevent and Reverse Heart Disease by Dr Suzzette Righter. 3) Dr Katherina Right Book:  Program to Reverse Diabetes  Exercise recommendations are:  If unable to walk, then the patient can exercise in a chair 3 times a day. By flapping arms like a bird gently and raising legs outwards to the front.  If ambulatory, the patient can go for walks for 30 minutes 3 times a week. Then increase the intensity and duration as tolerated.  Goal is to try to attain exercise frequency to 5 times a week.  If applicable: Best to perform resistance exercises (machines or weights) 2 days a week and cardio type exercises 3 days per week.

## 2012-09-25 LAB — COMPLETE METABOLIC PANEL WITH GFR
ALT: 36 U/L (ref 0–53)
AST: 24 U/L (ref 0–37)
Albumin: 4.4 g/dL (ref 3.5–5.2)
Alkaline Phosphatase: 51 U/L (ref 39–117)
BUN: 19 mg/dL (ref 6–23)
CO2: 31 mEq/L (ref 19–32)
Calcium: 10.1 mg/dL (ref 8.4–10.5)
Chloride: 99 mEq/L (ref 96–112)
Creat: 1.33 mg/dL (ref 0.50–1.35)
GFR, Est African American: 80 mL/min
GFR, Est Non African American: 69 mL/min
Glucose, Bld: 96 mg/dL (ref 70–99)
Potassium: 4.3 mEq/L (ref 3.5–5.3)
Sodium: 138 mEq/L (ref 135–145)
Total Bilirubin: 0.3 mg/dL (ref 0.3–1.2)
Total Protein: 7.5 g/dL (ref 6.0–8.3)

## 2012-09-25 LAB — NMR LIPOPROFILE WITH LIPIDS
Cholesterol, Total: 221 mg/dL — ABNORMAL HIGH (ref <200)
HDL Particle Number: 31 umol/L (ref >=30.5)
HDL Size: 8.4 nm — ABNORMAL LOW (ref >=9.2)
HDL-C: 40 mg/dL (ref >=40)
LDL (calc): 156 mg/dL — ABNORMAL HIGH (ref <100)
LDL Particle Number: 2027 nmol/L — ABNORMAL HIGH (ref <1000)
LDL Size: 20.8 nm (ref >20.5)
LP-IR Score: 73 — ABNORMAL HIGH (ref <=45)
Large HDL-P: <1.3 umol/L — ABNORMAL LOW (ref >=4.8)
Large VLDL-P: 3.5 nmol/L — ABNORMAL HIGH (ref <=2.7)
Small LDL Particle Number: 1017 nmol/L — ABNORMAL HIGH (ref <=527)
Triglycerides: 125 mg/dL (ref <150)
VLDL Size: 46.1 nm (ref <=46.6)

## 2012-09-25 LAB — HELICOBACTER PYLORI ABS-IGG+IGA, BLD
H Pylori IgG: 0.8 {ISR}
HELICOBACTER PYLORI AB, IGA: 6.2 U/mL (ref <9.0)

## 2012-09-25 LAB — TSH: TSH: 1.409 u[IU]/mL (ref 0.350–4.500)

## 2012-09-25 LAB — LIPASE: Lipase: 54 U/L (ref 0–75)

## 2012-09-25 LAB — MICROALBUMIN, URINE: Microalb, Ur: 0.81 mg/dL (ref 0.00–1.89)

## 2012-09-25 LAB — AMYLASE: Amylase: 35 U/L (ref 0–105)

## 2012-09-27 ENCOUNTER — Other Ambulatory Visit: Payer: Self-pay | Admitting: Family Medicine

## 2012-09-27 DIAGNOSIS — E039 Hypothyroidism, unspecified: Secondary | ICD-10-CM

## 2012-09-27 MED ORDER — ATORVASTATIN CALCIUM 40 MG PO TABS: 40.0000 mg | ORAL_TABLET | Freq: Every day | ORAL | Status: DC

## 2012-10-01 ENCOUNTER — Ambulatory Visit (HOSPITAL_COMMUNITY): Payer: 59 | Attending: Family Medicine

## 2012-10-02 ENCOUNTER — Other Ambulatory Visit: Payer: Self-pay | Admitting: Family Medicine

## 2012-10-08 ENCOUNTER — Other Ambulatory Visit (HOSPITAL_COMMUNITY): Payer: 59

## 2012-10-10 ENCOUNTER — Ambulatory Visit: Payer: 59 | Admitting: Family Medicine

## 2012-10-25 ENCOUNTER — Telehealth: Payer: Self-pay | Admitting: Family Medicine

## 2012-10-25 NOTE — Telephone Encounter (Signed)
Left message on pt cell phone per dr [place] Charon he would need to be seen.

## 2012-12-18 ENCOUNTER — Other Ambulatory Visit: Payer: Self-pay | Admitting: Family Medicine

## 2013-01-16 ENCOUNTER — Other Ambulatory Visit: Payer: Self-pay

## 2013-02-10 IMAGING — CR DG LUMBAR SPINE COMPLETE 4+V
5 series · 5 of 5 positions shown · non-contrast
Comparison: None.

CLINICAL DATA: 34-year-old male with pain, lifting injury.  Pain
radiates down the right lower extremity.

LUMBAR SPINE - COMPLETE 4+ VIEW

[view not recorded (1 of 5)]
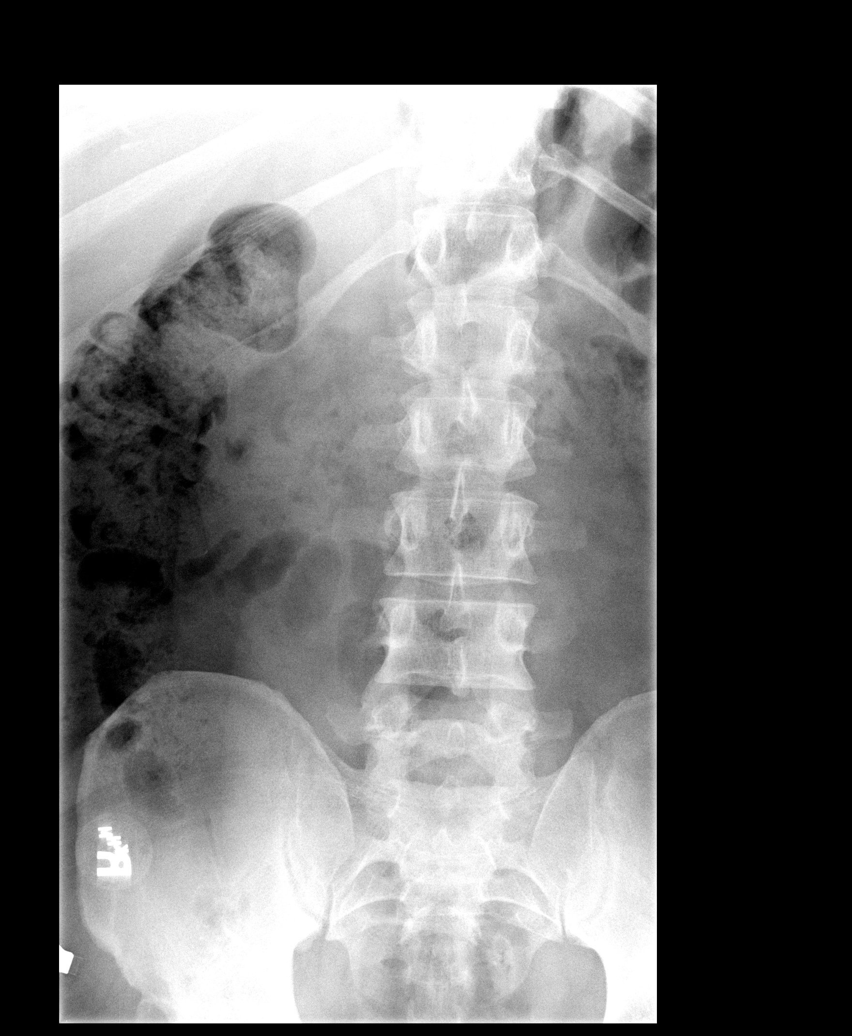

[view not recorded (2 of 5)]
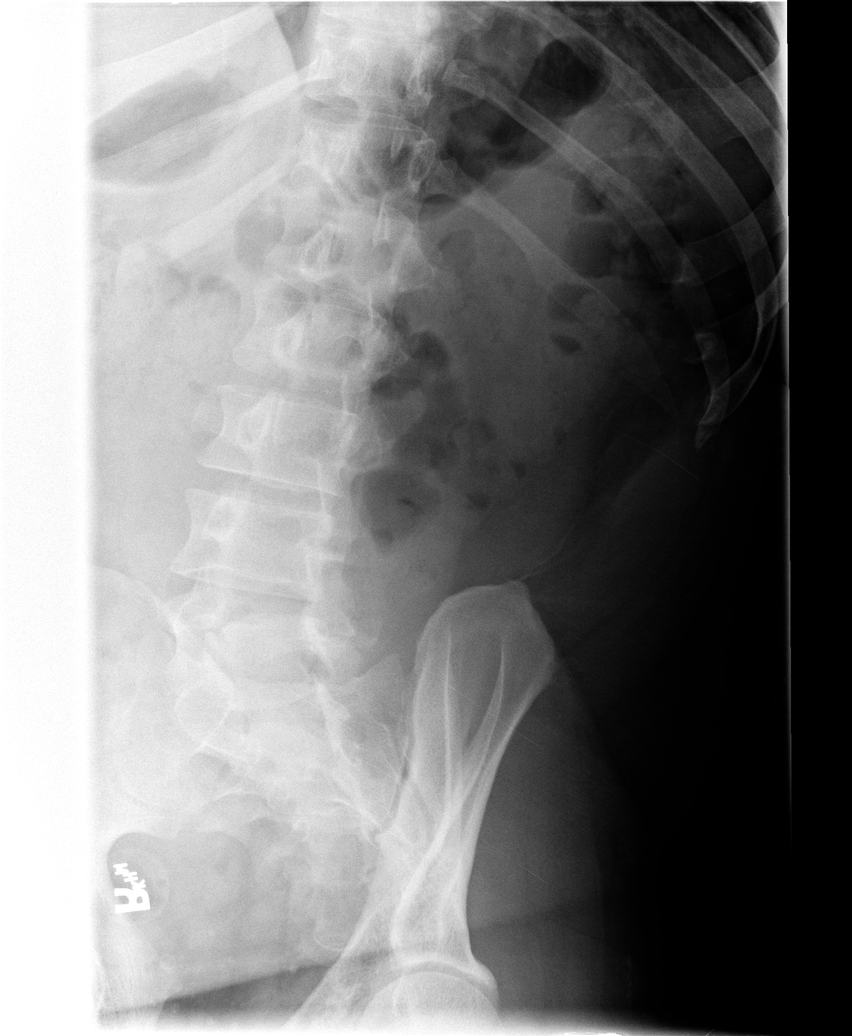

[view not recorded (3 of 5)]
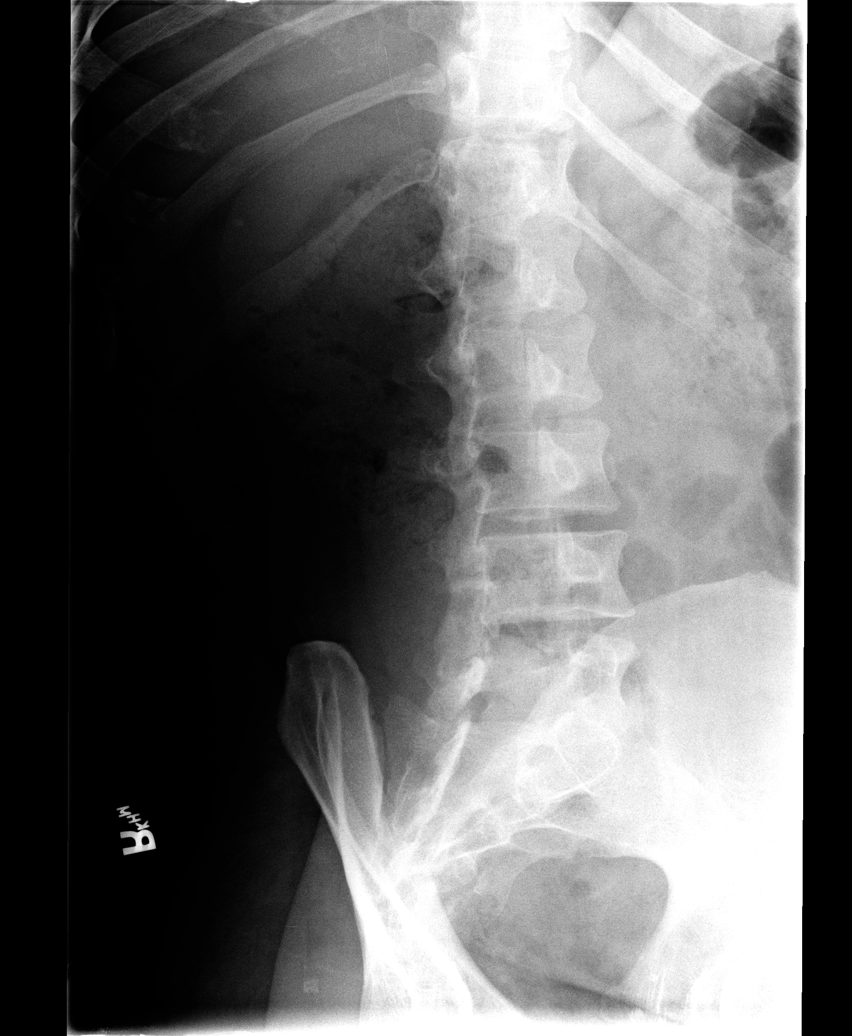

[view not recorded (4 of 5)]
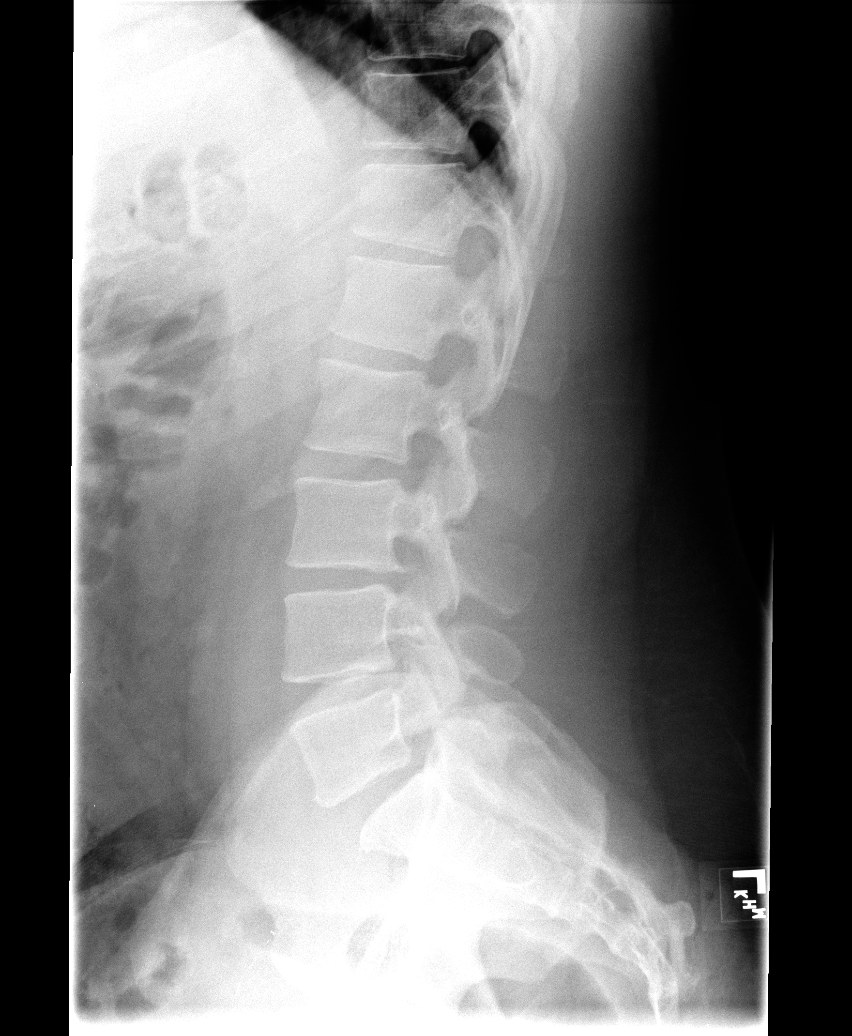

[view not recorded (5 of 5)]
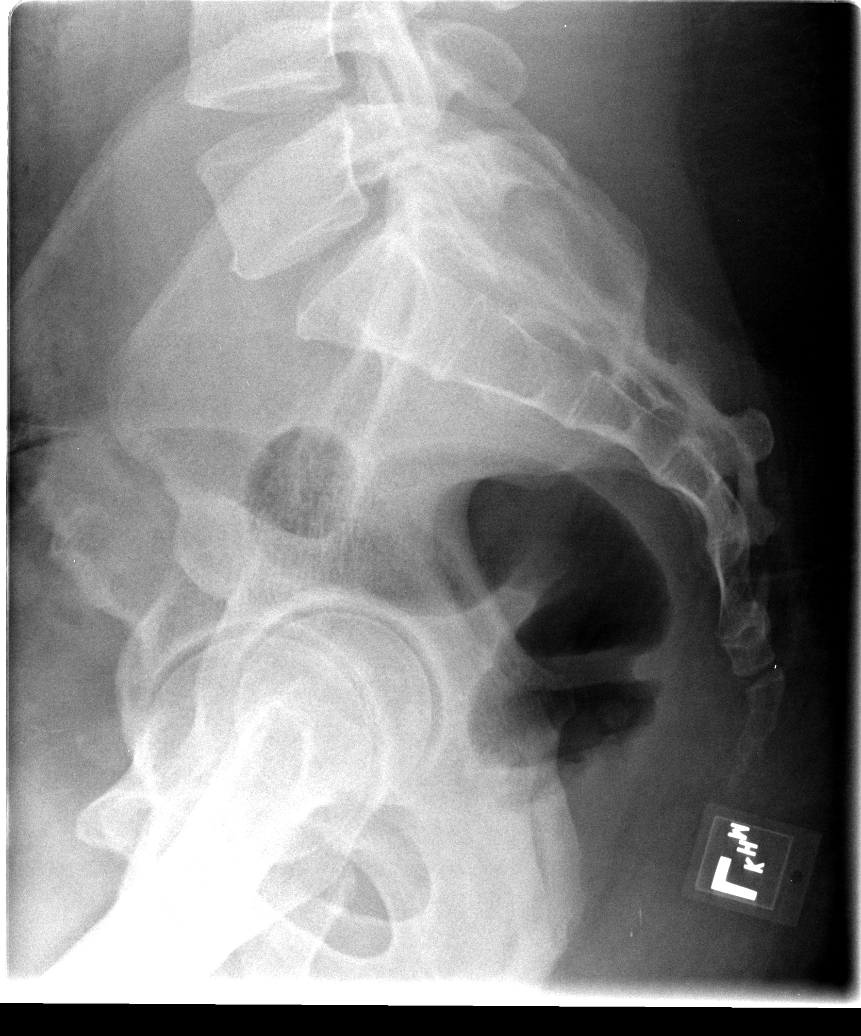

[5 of 5 positions shown; findings below may reference images not displayed]

FINDINGS: Normal lumbar segmentation. Bone mineralization is within
normal limits.  Normal vertebral height and alignment. Preserved
disc spaces.  Mild endplate osteophytes at L3-L4 and L4-L5.  Sacrum
and SI joints within normal limits.  Bilateral chronic-appearing
pars defects at L5-S1.  Visualized lower thoracic levels appear
intact; congenital and/or chronic wedging of T12.
IMPRESSION: 1.  Chronic-appearing bilateral L5-S1 pars fractures without
associated spondylolisthesis.
2. No acute osseous abnormality in the lumbar spine.

## 2013-03-26 ENCOUNTER — Encounter: Payer: Self-pay | Admitting: Nurse Practitioner

## 2013-03-26 ENCOUNTER — Ambulatory Visit (INDEPENDENT_AMBULATORY_CARE_PROVIDER_SITE_OTHER): Payer: 59 | Admitting: Nurse Practitioner

## 2013-03-26 VITALS — BP 164/105 | HR 81 | Temp 98.4°F | Resp 18 | Ht 72.0 in | Wt 311.0 lb

## 2013-03-26 DIAGNOSIS — F329 Major depressive disorder, single episode, unspecified: Secondary | ICD-10-CM

## 2013-03-26 DIAGNOSIS — Z0000 Encounter for general adult medical examination without abnormal findings: Secondary | ICD-10-CM

## 2013-03-26 DIAGNOSIS — E039 Hypothyroidism, unspecified: Secondary | ICD-10-CM

## 2013-03-26 DIAGNOSIS — E785 Hyperlipidemia, unspecified: Secondary | ICD-10-CM

## 2013-03-26 DIAGNOSIS — F3289 Other specified depressive episodes: Secondary | ICD-10-CM

## 2013-03-26 DIAGNOSIS — Z6841 Body Mass Index (BMI) 40.0 and over, adult: Secondary | ICD-10-CM

## 2013-03-26 DIAGNOSIS — I10 Essential (primary) hypertension: Secondary | ICD-10-CM

## 2013-03-26 LAB — LIPID PANEL
CHOLESTEROL: 255 mg/dL — AB (ref 0–200)
HDL: 43.1 mg/dL (ref >39.00)
Total CHOL/HDL Ratio: 6
Triglycerides: 82 mg/dL (ref 0.0–149.0)
VLDL: 16.4 mg/dL (ref 0.0–40.0)

## 2013-03-26 LAB — COMPREHENSIVE METABOLIC PANEL
ALT: 32 U/L (ref 0–53)
AST: 23 U/L (ref 0–37)
Albumin: 4.2 g/dL (ref 3.5–5.2)
Alkaline Phosphatase: 59 U/L (ref 39–117)
BILIRUBIN TOTAL: 0.7 mg/dL (ref 0.3–1.2)
BUN: 14 mg/dL (ref 6–23)
CO2: 28 meq/L (ref 19–32)
CREATININE: 1 mg/dL (ref 0.4–1.5)
Calcium: 9.7 mg/dL (ref 8.4–10.5)
Chloride: 102 mEq/L (ref 96–112)
GFR: 110.43 mL/min (ref >60.00)
GLUCOSE: 104 mg/dL — AB (ref 70–99)
Potassium: 4.2 mEq/L (ref 3.5–5.1)
SODIUM: 138 meq/L (ref 135–145)
TOTAL PROTEIN: 8.1 g/dL (ref 6.0–8.3)

## 2013-03-26 LAB — CBC
HEMATOCRIT: 47.6 % (ref 39.0–52.0)
HEMOGLOBIN: 15.7 g/dL (ref 13.0–17.0)
MCHC: 33 g/dL (ref 30.0–36.0)
MCV: 82 fl (ref 78.0–100.0)
PLATELETS: 260 10*3/uL (ref 150.0–400.0)
RBC: 5.8 Mil/uL (ref 4.22–5.81)
RDW: 15.3 % — ABNORMAL HIGH (ref 11.5–14.6)
WBC: 5.7 10*3/uL (ref 4.5–10.5)

## 2013-03-26 LAB — LDL CHOLESTEROL, DIRECT: Direct LDL: 208.6 mg/dL

## 2013-03-26 LAB — HEMOGLOBIN A1C: Hgb A1c MFr Bld: 6.7 % — ABNORMAL HIGH (ref 4.6–6.5)

## 2013-03-26 LAB — TSH: TSH: 2.78 u[IU]/mL (ref 0.35–5.50)

## 2013-03-26 NOTE — Progress Notes (Signed)
Subjective:     [name] is a 38 y.o. male and is here for a comprehensive physical exam. He is currently treated for HTN, hyperlipidemia, diabetes, depression, & GERD. The patient reports problems - frustration with weight loss and nasal congestion.Marland Kitchen  History   Social History  . Marital Status: Married    Spouse Name: N/A    Number of Children: N/A  . Years of Education: N/A   Occupational History  . Not on file.   Social History Main Topics  . Smoking status: Never Smoker   . Smokeless tobacco: Not on file  . Alcohol Use: Not on file  . Drug Use: Not on file  . Sexual Activity: Not on file   Other Topics Concern  . Not on file   Social History Narrative  . No narrative on file   Health Maintenance  Topic Date Due  . Tetanus/tdap  11/27/1996  . Influenza Vaccine  10/11/2012    The following portions of the patient's history were reviewed and updated as appropriate: allergies, current medications, past family history, past medical history, past social history and problem list.  Review of Systems Constitutional: negative for chills, fatigue, fevers and night sweats Eyes: positive for contacts/glasses Ears, nose, mouth, throat, and face: negative, recurrent ear wax build up Respiratory: positive for asthma, cough, sputum and wheezing Cardiovascular: positive for palpitations, negative for chest pain, chest pressure/discomfort, fatigue, lower extremity edema and near-syncope Gastrointestinal: positive for constipation and reflux symptoms, negative for diarrhea and nausea Behavioral/Psych: positive for anxiety, depression, sleep disturbance and symptoms well controlled with 40 mg celexa, negative for decreased appetite, excessive alcohol consumption, illegal drug usage, loss of interest in favorite activities and tobacco use Endocrine: positive for diabetic symptoms including skin changes at back of neck, negative for diabetic symptoms including polydipsia, polyphagia  and polyuria Allergic/Immunologic: negative for hay fever and exposure to environmental dust.   Objective:    BP 164/105  Pulse 81  Temp(Src) 98.4 F (36.9 C) (Temporal)  Resp 18  Ht 6' (1.829 m)  Wt 311 lb (141.069 kg)  BMI 42.17 kg/m2  SpO2 99% General appearance: alert, cooperative, appears stated age and no distress Head: Normocephalic, without obvious abnormality, atraumatic Eyes: positive findings: glasses, lids & lashes clear, conjunctiva clear, few dark spots in sclera Ears: unable to visualize RTM due to cerumen, LTM nml. Throat: lips, mucosa, and tongue normal; teeth and gums normal Neck: no adenopathy, no carotid bruit, supple, symmetrical, trachea midline, thyroid not enlarged, symmetric, no tenderness/mass/nodules and thickened, darkened skin psoterior neck Lungs: clear to auscultation bilaterally Heart: regular rate and rhythm, S1, S2 normal, no murmur, click, rub or gallop Abdomen: soft, non-tender; bowel sounds normal; no masses,  no organomegaly and obese Extremities: extremities normal, atraumatic, no cyanosis or edema Pulses: 2+ and symmetric Skin: acanthus nigrcans posterior neck Lymph nodes: no cervical or supraclavicular LAD    Assessment:    1 preventive care- did not ask about vaccine status, declined HIV screen, CBC, vit D 2 HTN fair control w/ 10 mg lisinopril, 10 mg amlodopine, 12.5 mg HCTZ 3 hyperlipidemia, taking crestor 40 mg 4 depression w/anxiety well controlled on 40 mg celexa 5 GERD, not taking protonix, continues to have reflux mostly at night. 6 BMI 42.96 7 hypothyroidism, taking 75 mcg synthroid 8 pre-diabetes. Last HgbA1C 6.3. Not taking metformin.   Plan:    1 assess vaccines next visit, monitor labs 2 weight loss, 30 min walk daily. continue meds. CMET 3 Continue med.  Lipids. Exercise. 4 Continue meds. Exercise. 5 eat small meals in evening, avoid overeating, weight loss 6 2500 daily Calorie limit, daily exercise. Book "Eat to  Live" Fuhrman 7 Cont meds. TSH. 8 Lose 10% body weight. HgbA1C. F/u 3 mos. See After Visit Summary for Counseling Recommendations

## 2013-03-26 NOTE — Patient Instructions (Signed)
Our office will call you with lab results. For weight loss: limit calories to 2500 calories daily. Choose foods high in nutrients like nuts, seeds, fruits & veggies, lean meats & fish. Exercise goal is walk 30 minutes daily. You will lose 2-4 pounds weekly and greatly decrease your risk for heart disease, diabetes, stroke, and cancer. For sinus health: daily sinus rinses after work or in evening (Neilmed sinus rinse). Great to meet you! see you in 3 months. Preventive Care for Adults, Male A healthy lifestyle and preventive care can promote health and wellness. Preventive health guidelines for men include the following key practices:  A routine yearly physical is a good way to check with your health care provider about your health and preventative screening. It is a chance to share any concerns and updates on your health and to receive a thorough exam.  Visit your dentist for a routine exam and preventative care every 6 months. Brush your teeth twice a day and floss once a day. Good oral hygiene prevents tooth decay and gum disease.  The frequency of eye exams is based on your age, health, family medical history, use of contact lenses, and other factors. Follow your health care provider's recommendations for frequency of eye exams.  Eat a healthy diet. Foods such as vegetables, fruits, whole grains, low-fat dairy products, and lean protein foods contain the nutrients you need without too many calories. Decrease your intake of foods high in solid fats, added sugars, and salt. Eat the right amount of calories for you.Get information about a proper diet from your health care provider, if necessary.  Regular physical exercise is one of the most important things you can do for your health. Most adults should get at least 150 minutes of moderate-intensity exercise (any activity that increases your heart rate and causes you to sweat) each week. In addition, most adults need muscle-strengthening exercises on 2  or more days a week.  Maintain a healthy weight. The body mass index (BMI) is a screening tool to identify possible weight problems. It provides an estimate of body fat based on height and weight. Your health care provider can find your BMI and can help you achieve or maintain a healthy weight.For adults 20 years and older:  A BMI below 18.5 is considered underweight.  A BMI of 18.5 to 24.9 is normal.  A BMI of 25 to 29.9 is considered overweight.  A BMI of 30 and above is considered obese.  Maintain normal blood lipids and cholesterol levels by exercising and minimizing your intake of saturated fat. Eat a balanced diet with plenty of fruit and vegetables. Blood tests for lipids and cholesterol should begin at age 84 and be repeated every 5 years. If your lipid or cholesterol levels are high, you are over 50, or you are at high risk for heart disease, you may need your cholesterol levels checked more frequently.Ongoing high lipid and cholesterol levels should be treated with medicines if diet and exercise are not working.  If you smoke, find out from your health care provider how to quit. If you do not use tobacco, do not start.  Lung cancer screening is recommended for adults aged 67 80 years who are at high risk for developing lung cancer because of a history of smoking. A yearly low-dose CT scan of the lungs is recommended for people who have at least a 30-pack-year history of smoking and are a current smoker or have quit within the past 15 years. A pack  year of smoking is smoking an average of 1 pack of cigarettes a day for 1 year (for example: 1 pack a day for 30 years or 2 packs a day for 15 years). Yearly screening should continue until the smoker has stopped smoking for at least 15 years. Yearly screening should be stopped for people who develop a health problem that would prevent them from having lung cancer treatment.  If you choose to drink alcohol, do not have more than 2 drinks per  day. One drink is considered to be 12 ounces (355 mL) of beer, 5 ounces (148 mL) of wine, or 1.5 ounces (44 mL) of liquor.  Avoid use of street drugs. Do not share needles with anyone. Ask for help if you need support or instructions about stopping the use of drugs.  High blood pressure causes heart disease and increases the risk of stroke. Your blood pressure should be checked at least every 1 2 years. Ongoing high blood pressure should be treated with medicines, if weight loss and exercise are not effective.  If you are 73 36 years old, ask your health care provider if you should take aspirin to prevent heart disease.  Diabetes screening involves taking a blood sample to check your fasting blood sugar level. This should be done once every 3 years, after age 14, if you are within normal weight and without risk factors for diabetes. Testing should be considered at a younger age or be carried out more frequently if you are overweight and have at least 1 risk factor for diabetes.  Colorectal cancer can be detected and often prevented. Most routine colorectal cancer screening begins at the age of 33 and continues through age 60. However, your health care provider may recommend screening at an earlier age if you have risk factors for colon cancer. On a yearly basis, your health care provider may provide home test kits to check for hidden blood in the stool. Use of a small camera at the end of a tube to directly examine the colon (sigmoidoscopy or colonoscopy) can detect the earliest forms of colorectal cancer. Talk to your health care provider about this at age 79, when routine screening begins. Direct exam of the colon should be repeated every 5 10 years through age 48, unless early forms of precancerous polyps or small growths are found.  People who are at an increased risk for hepatitis B should be screened for this virus. You are considered at high risk for hepatitis B if:  You were born in a country  where hepatitis B occurs often. Talk with your health care provider about which countries are considered high-risk.  Your parents were born in a high-risk country and you have not received a shot to protect against hepatitis B (hepatitis B vaccine).  You have HIV or AIDS.  You use needles to inject street drugs.  You live with, or have sex with, someone who has hepatitis B.  You are a man who has sex with other men (MSM).  You get hemodialysis treatment.  You take certain medicines for conditions such as cancer, organ transplantation, and autoimmune conditions.  Hepatitis C blood testing is recommended for all people born from [birth year] through 1965 and any individual with known risks for hepatitis C.  Practice safe sex. Use condoms and avoid high-risk sexual practices to reduce the spread of sexually transmitted infections (STIs). STIs include gonorrhea, chlamydia, syphilis, trichomonas, herpes, HPV, and human immunodeficiency virus (HIV). Herpes, HIV, and HPV are viral  illnesses that have no cure. They can result in disability, cancer, and death.  A one-time screening for abdominal aortic aneurysm (AAA) and surgical repair of large AAAs by ultrasound are recommended for men ages 22 to 71 years who are current or former smokers.  Healthy men should no longer receive prostate-specific antigen (PSA) blood tests as part of routine cancer screening. Talk with your health care provider about prostate cancer screening.  Testicular cancer screening is not recommended for adult males who have no symptoms. Screening includes self-exam, a health care provider exam, and other screening tests. Consult with your health care provider about any symptoms you have or any concerns you have about testicular cancer.  Use sunscreen. Apply sunscreen liberally and repeatedly throughout the day. You should seek shade when your shadow is shorter than you. Protect yourself by wearing long sleeves, pants, a wide-brimmed  hat, and sunglasses year round, whenever you are outdoors.  Once a month, do a whole-body skin exam, using a mirror to look at the skin on your back. Tell your health care provider about new moles, moles that have irregular borders, moles that are larger than a pencil eraser, or moles that have changed in shape or color.  Stay current with required vaccines (immunizations).  Influenza vaccine. All adults should be immunized every year.  Tetanus, diphtheria, and acellular pertussis (Td, Tdap) vaccine. An adult who has not previously received Tdap or who does not know his vaccine status should receive 1 dose of Tdap. This initial dose should be followed by tetanus and diphtheria toxoids (Td) booster doses every 10 years. Adults with an unknown or incomplete history of completing a 3-dose immunization series with Td-containing vaccines should begin or complete a primary immunization series including a Tdap dose. Adults should receive a Td booster every 10 years.  Varicella vaccine. An adult without evidence of immunity to varicella should receive 2 doses or a second dose if he has previously received 1 dose.  Human papillomavirus (HPV) vaccine. Males aged 44 21 years who have not received the vaccine previously should receive the 3-dose series. Males aged 62 26 years may be immunized. Immunization is recommended through the age of 52 years for any male who has sex with males and did not get any or all doses earlier. Immunization is recommended for any person with an immunocompromised condition through the age of 38 years if he did not get any or all doses earlier. During the 3-dose series, the second dose should be obtained 4 8 weeks after the first dose. The third dose should be obtained 24 weeks after the first dose and 16 weeks after the second dose.  Zoster vaccine. One dose is recommended for adults aged 63 years or older unless certain conditions are present.  Measles, mumps, and rubella (MMR)  vaccine. Adults born before [birth year] generally are considered immune to measles and mumps. Adults born in [birth year] or later should have 1 or more doses of MMR vaccine unless there is a contraindication to the vaccine or there is laboratory evidence of immunity to each of the three diseases. A routine second dose of MMR vaccine should be obtained at least 28 days after the first dose for students attending postsecondary schools, health care workers, or international travelers. People who received inactivated measles vaccine or an unknown type of measles vaccine during 1963 1967 should receive 2 doses of MMR vaccine. People who received inactivated mumps vaccine or an unknown type of mumps vaccine before 1979 and are at  high risk for mumps infection should consider immunization with 2 doses of MMR vaccine. Unvaccinated health care workers born before [birth year] who lack laboratory evidence of measles, mumps, or rubella immunity or laboratory confirmation of disease should consider measles and mumps immunization with 2 doses of MMR vaccine or rubella immunization with 1 dose of MMR vaccine.  Pneumococcal 13-valent conjugate (PCV13) vaccine. When indicated, a person who is uncertain of his immunization history and has no record of immunization should receive the PCV13 vaccine. An adult aged 25 years or older who has certain medical conditions and has not been previously immunized should receive 1 dose of PCV13 vaccine. This PCV13 should be followed with a dose of pneumococcal polysaccharide (PPSV23) vaccine. The PPSV23 vaccine dose should be obtained at least 8 weeks after the dose of PCV13 vaccine. An adult aged 22 years or older who has certain medical conditions and previously received 1 or more doses of PPSV23 vaccine should receive 1 dose of PCV13. The PCV13 vaccine dose should be obtained 1 or more years after the last PPSV23 vaccine dose.  Pneumococcal polysaccharide (PPSV23) vaccine. When PCV13 is also indicated, PCV13  should be obtained first. All adults aged 51 years and older should be immunized. An adult younger than age 30 years who has certain medical conditions should be immunized. Any person who resides in a nursing home or long-term care facility should be immunized. An adult smoker should be immunized. People with an immunocompromised condition and certain other conditions should receive both PCV13 and PPSV23 vaccines. People with human immunodeficiency virus (HIV) infection should be immunized as soon as possible after diagnosis. Immunization during chemotherapy or radiation therapy should be avoided. Routine use of PPSV23 vaccine is not recommended for American Indians, [address], or people younger than 65 years unless there are medical conditions that require PPSV23 vaccine. When indicated, people who have unknown immunization and have no record of immunization should receive PPSV23 vaccine. One-time revaccination 5 years after the first dose of PPSV23 is recommended for people aged 75 64 years who have chronic kidney failure, nephrotic syndrome, asplenia, or immunocompromised conditions. People who received 1 2 doses of PPSV23 before age 80 years should receive another dose of PPSV23 vaccine at age 56 years or later if at least 5 years have passed since the previous dose. Doses of PPSV23 are not needed for people immunized with PPSV23 at or after age 68 years.  Meningococcal vaccine. Adults with asplenia or persistent complement component deficiencies should receive 2 doses of quadrivalent meningococcal conjugate (MenACWY-D) vaccine. The doses should be obtained at least 2 months apart. Microbiologists working with certain meningococcal bacteria, military recruits, people at risk during an outbreak, and people who travel to or live in countries with a high rate of meningitis should be immunized. A first-year college student up through age 82 years who is living in a residence hall should receive a dose if he  did not receive a dose on or after his 16th birthday. Adults who have certain high-risk conditions should receive one or more doses of vaccine.  Hepatitis A vaccine. Adults who wish to be protected from this disease, have certain high-risk conditions, work with hepatitis A-infected animals, work in hepatitis A research labs, or travel to or work in countries with a high rate of hepatitis A should be immunized. Adults who were previously unvaccinated and who anticipate close contact with an international adoptee during the first 60 days after arrival in the [place] from a country with  a high rate of hepatitis A should be immunized.  Hepatitis B vaccine. Adults who wish to be protected from this disease, have certain high-risk conditions, may be exposed to blood or other infectious body fluids, are household contacts or sex partners of hepatitis B positive people, are clients or workers in certain care facilities, or travel to or work in countries with a high rate of hepatitis B should be immunized.  Haemophilus influenzae type b (Hib) vaccine. A previously unvaccinated person with asplenia or sickle cell disease or having a scheduled splenectomy should receive 1 dose of Hib vaccine. Regardless of previous immunization, a recipient of a hematopoietic stem cell transplant should receive a 3-dose series 6 12 months after his successful transplant. Hib vaccine is not recommended for adults with HIV infection. Preventive Service / Frequency Ages 14 to 68  Blood pressure check.** / Every 1 to 2 years.  Lipid and cholesterol check.** / Every 5 years beginning at age 93.  Hepatitis C blood test.** / For any individual with known risks for hepatitis C.  Skin self-exam. / Monthly.  Influenza vaccine. / Every year.  Tetanus, diphtheria, and acellular pertussis (Tdap, Td) vaccine.** / Consult your health care provider. 1 dose of Td every 10 years.  Varicella vaccine.** / Consult your health care  provider.  HPV vaccine. / 3 doses over 6 months, if 8 or younger.  Measles, mumps, rubella (MMR) vaccine.** / You need at least 1 dose of MMR if you were born in 1957 or later. You may also need a second dose.  Pneumococcal 13-valent conjugate (PCV13) vaccine.** / Consult your health care provider.  Pneumococcal polysaccharide (PPSV23) vaccine.** / 1 to 2 doses if you smoke cigarettes or if you have certain conditions.  Meningococcal vaccine.** / 1 dose if you are age 11 to 87 years and a Market researcher living in a residence hall, or have one of several medical conditions. You may also need additional booster doses.  Hepatitis A vaccine.** / Consult your health care provider.  Hepatitis B vaccine.** / Consult your health care provider.  Haemophilus influenzae type b (Hib) vaccine.** / Consult your health care provider. Ages 63 to 72  Blood pressure check.** / Every 1 to 2 years.  Lipid and cholesterol check.** / Every 5 years beginning at age 66.  Lung cancer screening. / Every year if you are aged 71 80 years and have a 30-pack-year history of smoking and currently smoke or have quit within the past 15 years. Yearly screening is stopped once you have quit smoking for at least 15 years or develop a health problem that would prevent you from having lung cancer treatment.  Fecal occult blood test (FOBT) of stool. / Every year beginning at age 45 and continuing until age 81. You may not have to do this test if you get a colonoscopy every 10 years.  Flexible sigmoidoscopy** or colonoscopy.** / Every 5 years for a flexible sigmoidoscopy or every 10 years for a colonoscopy beginning at age 30 and continuing until age 30.  Hepatitis C blood test.** / For all people born from [birth year] through 1963 and any individual with known risks for hepatitis C.  Skin self-exam. / Monthly.  Influenza vaccine. / Every year.  Tetanus, diphtheria, and acellular pertussis (Tdap/Td) vaccine.** /  Consult your health care provider. 1 dose of Td every 10 years.  Varicella vaccine.** / Consult your health care provider.  Zoster vaccine.** / 1 dose for adults aged 26 years or older.  Measles, mumps, rubella (MMR) vaccine.** / You need at least 1 dose of MMR if you were born in 1957 or later. You may also need a second dose.  Pneumococcal 13-valent conjugate (PCV13) vaccine.** / Consult your health care provider.  Pneumococcal polysaccharide (PPSV23) vaccine.** / 1 to 2 doses if you smoke cigarettes or if you have certain conditions.  Meningococcal vaccine.** / Consult your health care provider.  Hepatitis A vaccine.** / Consult your health care provider.  Hepatitis B vaccine.** / Consult your health care provider.  Haemophilus influenzae type b (Hib) vaccine.** / Consult your health care provider. **Family history and personal history of risk and conditions may change your health care provider's recommendations. Document Released: 04/25/2001 Document Revised: 12/18/2012 Document Reviewed: 07/25/2010 [facility] Patient Information 2014 [place], [place].

## 2013-03-26 NOTE — Progress Notes (Signed)
Pre visit review using our clinic review tool, if applicable. No additional management support is needed unless otherwise documented below in the visit note. 

## 2013-03-27 ENCOUNTER — Telehealth: Payer: Self-pay | Admitting: Nurse Practitioner

## 2013-03-27 DIAGNOSIS — F411 Generalized anxiety disorder: Secondary | ICD-10-CM

## 2013-03-27 DIAGNOSIS — I10 Essential (primary) hypertension: Secondary | ICD-10-CM

## 2013-03-27 DIAGNOSIS — E039 Hypothyroidism, unspecified: Secondary | ICD-10-CM

## 2013-03-27 LAB — VITAMIN D 25 HYDROXY (VIT D DEFICIENCY, FRACTURES): VIT D 25 HYDROXY: 15 ng/mL — AB (ref 30–89)

## 2013-03-27 MED ORDER — CITALOPRAM HYDROBROMIDE 40 MG PO TABS: 40.0000 mg | ORAL_TABLET | Freq: Every day | ORAL | Status: DC

## 2013-03-27 MED ORDER — LEVOTHYROXINE SODIUM 75 MCG PO TABS: 75.0000 ug | ORAL_TABLET | Freq: Every day | ORAL | Status: DC

## 2013-03-27 MED ORDER — HYDROCHLOROTHIAZIDE 12.5 MG PO TABS: 12.5000 mg | ORAL_TABLET | Freq: Every day | ORAL | Status: DC

## 2013-03-27 MED ORDER — AMLODIPINE BESYLATE 10 MG PO TABS: 10.0000 mg | ORAL_TABLET | Freq: Every day | ORAL | Status: DC

## 2013-03-27 MED ORDER — LISINOPRIL 10 MG PO TABS: 10.0000 mg | ORAL_TABLET | Freq: Every day | ORAL | Status: DC

## 2013-03-27 NOTE — Telephone Encounter (Signed)
Cbc & CMET nml Thyroid nml-no change to meds. Cholesterol too high-T 255/HDL 43/LDL 208 Needs to re-start lipitor. Lifestyle changes emphasized. HbgA1C has increased to 6.7 from 6.3. He is not taking metformin. Is motivated to make lifestyle changes. Recmd he restart metformin at low dose & recheck in 3 mos. Vit D too low-recmd D3 5000 iu daily. LM to discuss w/pt.

## 2013-03-31 NOTE — Telephone Encounter (Signed)
Patient came in to the office since he is hard to reach by phone. Printed note for patient. Patient voiced understanding & will go by pharmacy to pick up Rx and vitamin D supplements.

## 2013-04-16 ENCOUNTER — Telehealth: Payer: Self-pay | Admitting: Nurse Practitioner

## 2013-04-16 NOTE — Telephone Encounter (Signed)
Relevant patient education mailed to patient.  

## 2013-05-01 ENCOUNTER — Encounter (HOSPITAL_COMMUNITY): Payer: Self-pay | Admitting: Emergency Medicine

## 2013-05-01 ENCOUNTER — Emergency Department (HOSPITAL_COMMUNITY)
Admission: EM | Admit: 2013-05-01 | Discharge: 2013-05-01 | Disposition: A | Discharge status: Home / Self Care | Payer: 59 | Attending: Emergency Medicine | Admitting: Emergency Medicine

## 2013-05-01 DIAGNOSIS — F329 Major depressive disorder, single episode, unspecified: Secondary | ICD-10-CM | POA: Insufficient documentation

## 2013-05-01 DIAGNOSIS — R32 Unspecified urinary incontinence: Secondary | ICD-10-CM | POA: Insufficient documentation

## 2013-05-01 DIAGNOSIS — R109 Unspecified abdominal pain: Secondary | ICD-10-CM | POA: Insufficient documentation

## 2013-05-01 DIAGNOSIS — IMO0002 Reserved for concepts with insufficient information to code with codable children: Secondary | ICD-10-CM | POA: Insufficient documentation

## 2013-05-01 DIAGNOSIS — I10 Essential (primary) hypertension: Secondary | ICD-10-CM | POA: Insufficient documentation

## 2013-05-01 DIAGNOSIS — F3289 Other specified depressive episodes: Secondary | ICD-10-CM | POA: Insufficient documentation

## 2013-05-01 DIAGNOSIS — B9789 Other viral agents as the cause of diseases classified elsewhere: Secondary | ICD-10-CM | POA: Insufficient documentation

## 2013-05-01 DIAGNOSIS — Z79899 Other long term (current) drug therapy: Secondary | ICD-10-CM | POA: Insufficient documentation

## 2013-05-01 DIAGNOSIS — F411 Generalized anxiety disorder: Secondary | ICD-10-CM | POA: Insufficient documentation

## 2013-05-01 DIAGNOSIS — R3589 Other polyuria: Secondary | ICD-10-CM | POA: Insufficient documentation

## 2013-05-01 DIAGNOSIS — E119 Type 2 diabetes mellitus without complications: Secondary | ICD-10-CM | POA: Insufficient documentation

## 2013-05-01 DIAGNOSIS — R358 Other polyuria: Secondary | ICD-10-CM | POA: Insufficient documentation

## 2013-05-01 DIAGNOSIS — E669 Obesity, unspecified: Secondary | ICD-10-CM | POA: Insufficient documentation

## 2013-05-01 DIAGNOSIS — R110 Nausea: Secondary | ICD-10-CM | POA: Insufficient documentation

## 2013-05-01 DIAGNOSIS — E785 Hyperlipidemia, unspecified: Secondary | ICD-10-CM | POA: Insufficient documentation

## 2013-05-01 DIAGNOSIS — E039 Hypothyroidism, unspecified: Secondary | ICD-10-CM | POA: Insufficient documentation

## 2013-05-01 DIAGNOSIS — B349 Viral infection, unspecified: Secondary | ICD-10-CM

## 2013-05-01 DIAGNOSIS — Z7982 Long term (current) use of aspirin: Secondary | ICD-10-CM | POA: Insufficient documentation

## 2013-05-01 HISTORY — DX: Type 2 diabetes mellitus without complications: E11.9

## 2013-05-01 LAB — BASIC METABOLIC PANEL
BUN: 15 mg/dL (ref 6–23)
CO2: 28 mEq/L (ref 19–32)
CREATININE: 0.96 mg/dL (ref 0.50–1.35)
Calcium: 9.9 mg/dL (ref 8.4–10.5)
Chloride: 93 mEq/L — ABNORMAL LOW (ref 96–112)
Glucose, Bld: 118 mg/dL — ABNORMAL HIGH (ref 70–99)
Potassium: 3.7 mEq/L (ref 3.7–5.3)
Sodium: 135 mEq/L — ABNORMAL LOW (ref 137–147)

## 2013-05-01 LAB — URINALYSIS, ROUTINE W REFLEX MICROSCOPIC
BILIRUBIN URINE: NEGATIVE
Glucose, UA: NEGATIVE mg/dL
Hgb urine dipstick: NEGATIVE
KETONES UR: NEGATIVE mg/dL
Leukocytes, UA: NEGATIVE
Nitrite: NEGATIVE
PH: 6 (ref 5.0–8.0)
Protein, ur: NEGATIVE mg/dL
Specific Gravity, Urine: 1.016 (ref 1.005–1.030)
Urobilinogen, UA: 0.2 mg/dL (ref 0.0–1.0)

## 2013-05-01 LAB — CBC WITH DIFFERENTIAL/PLATELET
BASOS ABS: 0 10*3/uL (ref 0.0–0.1)
BASOS PCT: 1 % (ref 0–1)
EOS ABS: 0.1 10*3/uL (ref 0.0–0.7)
Eosinophils Relative: 2 % (ref 0–5)
HCT: 44.9 % (ref 39.0–52.0)
HEMOGLOBIN: 16 g/dL (ref 13.0–17.0)
Lymphocytes Relative: 36 % (ref 12–46)
Lymphs Abs: 1.9 10*3/uL (ref 0.7–4.0)
MCH: 28.2 pg (ref 26.0–34.0)
MCHC: 35.6 g/dL (ref 30.0–36.0)
MCV: 79 fL (ref 78.0–100.0)
MONO ABS: 0.4 10*3/uL (ref 0.1–1.0)
MONOS PCT: 8 % (ref 3–12)
Neutro Abs: 2.9 10*3/uL (ref 1.7–7.7)
Neutrophils Relative %: 54 % (ref 43–77)
Platelets: 302 10*3/uL (ref 150–400)
RBC: 5.68 MIL/uL (ref 4.22–5.81)
RDW: 14.2 % (ref 11.5–15.5)
WBC: 5.3 10*3/uL (ref 4.0–10.5)

## 2013-05-01 MED ORDER — HYDROCODONE-ACETAMINOPHEN 5-325 MG PO TABS
1.0000 | ORAL_TABLET | Freq: Once | ORAL | Status: AC
  Administered 2013-05-01: 1 via ORAL
  Filled 2013-05-01: qty 1

## 2013-05-01 MED ORDER — ONDANSETRON 4 MG PO TBDP
4.0000 mg | ORAL_TABLET | Freq: Once | ORAL | Status: AC
  Administered 2013-05-01: 4 mg via ORAL
  Filled 2013-05-01: qty 1

## 2013-05-01 MED ORDER — TRAMADOL HCL 50 MG PO TABS: 50.0000 mg | ORAL_TABLET | Freq: Four times a day (QID) | ORAL | Status: DC | PRN

## 2013-05-01 MED ORDER — HYDROCHLOROTHIAZIDE 25 MG PO TABS
25.0000 mg | ORAL_TABLET | Freq: Every day | ORAL | Status: DC
  Administered 2013-05-01: 25 mg via ORAL
  Filled 2013-05-01: qty 1

## 2013-05-01 MED ORDER — ONDANSETRON 4 MG PO TBDP: 4.0000 mg | ORAL_TABLET | Freq: Three times a day (TID) | ORAL | Status: DC | PRN

## 2013-05-01 MED ORDER — LISINOPRIL 10 MG PO TABS
10.0000 mg | ORAL_TABLET | Freq: Once | ORAL | Status: AC
  Administered 2013-05-01: 10 mg via ORAL
  Filled 2013-05-01: qty 1

## 2013-05-01 NOTE — Discharge Instructions (Signed)

## 2013-05-01 NOTE — ED Provider Notes (Signed)
CSN: [id number]     Arrival date & time 05/01/13  0747 History   First MD Initiated Contact with Patient 05/01/13 0756     Chief Complaint  Patient presents with  . Headache  . Generalized Body Aches  . Nausea  . Fever     (Consider location/radiation/quality/duration/timing/severity/associated sxs/prior Treatment) HPI  Past Medical History  Diagnosis Date  . Hypertension   . Depression   . Anxiety   . Hyperlipidemia   . Hypothyroidism   . Sinus congestion   . Obesity   . Diabetes mellitus without complication    Past Surgical History  Procedure Laterality Date  . Hernia repair  1990    Right ingruial & umbilical [place]   . Repair right arm fracture  1996    [place]   . Tonsillectomy    . Fracture surgery      right arch  . Tosillectomy    . Septal deviation     Family History  Problem Relation Age of Onset  . Hypertension Mother   . Hypertension Father   . Heart attack Father   . Down syndrome Brother   . Breast cancer Paternal Grandmother   . Kidney disease Maternal Grandfather    History  Substance Use Topics  . Smoking status: Never Smoker   . Smokeless tobacco: Not on file  . Alcohol Use: No    Review of Systems  Constitutional: Positive for fever and fatigue. Negative for chills, diaphoresis and appetite change.  HENT: Positive for congestion and sinus pressure. Negative for mouth sores, sore throat and trouble swallowing.   Eyes: Negative for visual disturbance.  Respiratory: Negative for cough, chest tightness, shortness of breath and wheezing.   Cardiovascular: Negative for chest pain.  Gastrointestinal: Negative for nausea, vomiting, abdominal pain, diarrhea and abdominal distention.  Endocrine: Positive for polyuria. Negative for polydipsia and polyphagia.  Genitourinary: Positive for frequency, flank pain and enuresis. Negative for dysuria and hematuria.  Musculoskeletal: Positive for myalgias. Negative for gait problem.  Skin: Negative  for color change, pallor and rash.  Neurological: Positive for headaches. Negative for dizziness, syncope and light-headedness.  Hematological: Does not bruise/bleed easily.  Psychiatric/Behavioral: Negative for behavioral problems and confusion.      Allergies  Cymbalta  Home Medications   Current Outpatient Rx  Name  Route  Sig  Dispense  Refill  . amLODipine (NORVASC) 10 MG tablet   Oral   Take 1 tablet (10 mg total) by mouth daily.   30 tablet   5   . aspirin 81 MG chewable tablet   Oral   Chew 81 mg by mouth daily.         . Cholecalciferol (VITAMIN D PO)   Oral   Take 1 tablet by mouth daily.         . citalopram (CELEXA) 40 MG tablet   Oral   Take 1 tablet (40 mg total) by mouth daily.   30 tablet   5   . fluticasone (FLONASE) 50 MCG/ACT nasal spray   Nasal   Place 2 sprays into the nose daily as needed. Nasal congestion   16 g   3   . ibuprofen (ADVIL,MOTRIN) 200 MG tablet   Oral   Take 200 mg by mouth every 6 (six) hours as needed.         Marland Kitchen levothyroxine (SYNTHROID, LEVOTHROID) 75 MCG tablet   Oral   Take 1 tablet (75 mcg total) by mouth daily.   30 tablet  5   . lisinopril-hydrochlorothiazide (PRINZIDE,ZESTORETIC) 20-25 MG per tablet   Oral   Take 1 tablet by mouth daily.         . metFORMIN (GLUCOPHAGE XR) 500 MG 24 hr tablet   Oral   Take 1 tablet (500 mg total) by mouth daily with breakfast.   30 tablet   11   . rosuvastatin (CRESTOR) 10 MG tablet   Oral   Take 10 mg by mouth daily.         . sodium chloride (OCEAN) 0.65 % nasal spray   Nasal   Place 1 spray into the nose as needed. Nasal dryness         . ondansetron (ZOFRAN ODT) 4 MG disintegrating tablet   Oral   Take 1 tablet (4 mg total) by mouth every 8 (eight) hours as needed for nausea.   20 tablet   0   . traMADol (ULTRAM) 50 MG tablet   Oral   Take 1 tablet (50 mg total) by mouth every 6 (six) hours as needed.   15 tablet   0    BP 127/91  Pulse 84   Temp(Src) 98.7 F (37.1 C) (Oral)  Resp 12  Wt 311 lb (141.069 kg)  SpO2 98% Physical Exam  Constitutional: He is oriented to person, place, and time. He appears well-developed and well-nourished. No distress.  HENT:  Head: Normocephalic.  Nose: Mucosal edema and rhinorrhea present.  Eyes: Conjunctivae are normal. Pupils are equal, round, and reactive to light. No scleral icterus.  Neck: Normal range of motion. Neck supple. No thyromegaly present.  Cardiovascular: Normal rate and regular rhythm.  Exam reveals no gallop and no friction rub.   No murmur heard. Pulmonary/Chest: Effort normal and breath sounds normal. No respiratory distress. He has no wheezes. He has no rales.  Abdominal: Soft. Bowel sounds are normal. He exhibits no distension. There is no tenderness. There is no rebound.    Musculoskeletal: Normal range of motion.  Neurological: He is alert and oriented to person, place, and time.  Skin: Skin is warm and dry. No rash noted.  Psychiatric: He has a normal mood and affect. His behavior is normal.    ED Course  Procedures (including critical care time) Labs Review Labs Reviewed  BASIC METABOLIC PANEL - Abnormal; Notable for the following:    Sodium 135 (*)    Chloride 93 (*)    Glucose, Bld 118 (*)    All other components within normal limits  URINALYSIS, ROUTINE W REFLEX MICROSCOPIC  CBC WITH DIFFERENTIAL   Imaging Review No results found.  EKG Interpretation   None       MDM   Final diagnoses:  Viral syndrome  Hypertension    Urine and renal function are normal. He is much improved after symmetric but with pain medication. No sign of UTI or hematuria suggest known. Simple expectant management with rest hydration antiemetics is all that is indicated.    Rolland PorterMark Blasa Raisch, MD 05/01/13 1134

## 2013-05-01 NOTE — ED Notes (Signed)
Pt states h/a started in back of neck radiates to forehead.

## 2013-05-01 NOTE — ED Notes (Signed)
C/o body aches, fever, back pain, frequent urination, nausea-- started a few days ago-- states had been urinating once an hour last night--urgency, frequency. 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  Alert/oriented x 3-- w/d,

## 2013-06-24 ENCOUNTER — Ambulatory Visit: Payer: 59 | Admitting: Nurse Practitioner

## 2013-06-24 ENCOUNTER — Other Ambulatory Visit: Payer: Self-pay | Admitting: Nurse Practitioner

## 2013-06-24 DIAGNOSIS — E119 Type 2 diabetes mellitus without complications: Secondary | ICD-10-CM

## 2013-06-24 DIAGNOSIS — I10 Essential (primary) hypertension: Secondary | ICD-10-CM

## 2013-06-25 MED ORDER — LISINOPRIL-HYDROCHLOROTHIAZIDE 20-25 MG PO TABS: 1.0000 | ORAL_TABLET | Freq: Every day | ORAL | Status: DC

## 2013-06-25 MED ORDER — METFORMIN HCL ER 500 MG PO TB24: 500.0000 mg | ORAL_TABLET | Freq: Every day | ORAL | Status: DC

## 2013-06-25 NOTE — Telephone Encounter (Signed)
Are these meds ok to fill until pt comes in for f/u 07/08/2013? You have never filled these meds.

## 2013-06-25 NOTE — Telephone Encounter (Signed)
Will not fill these again without appt. Pt was no show this week.

## 2013-07-08 ENCOUNTER — Ambulatory Visit (INDEPENDENT_AMBULATORY_CARE_PROVIDER_SITE_OTHER): Payer: 59 | Admitting: Nurse Practitioner

## 2013-07-08 ENCOUNTER — Encounter: Payer: Self-pay | Admitting: Nurse Practitioner

## 2013-07-08 ENCOUNTER — Telehealth: Payer: Self-pay | Admitting: Nurse Practitioner

## 2013-07-08 VITALS — BP 154/110 | HR 75 | Temp 98.0°F | Ht 72.0 in | Wt 312.0 lb

## 2013-07-08 DIAGNOSIS — I10 Essential (primary) hypertension: Secondary | ICD-10-CM

## 2013-07-08 DIAGNOSIS — E039 Hypothyroidism, unspecified: Secondary | ICD-10-CM

## 2013-07-08 DIAGNOSIS — F4323 Adjustment disorder with mixed anxiety and depressed mood: Secondary | ICD-10-CM

## 2013-07-08 DIAGNOSIS — R7309 Other abnormal glucose: Secondary | ICD-10-CM

## 2013-07-08 DIAGNOSIS — E119 Type 2 diabetes mellitus without complications: Secondary | ICD-10-CM

## 2013-07-08 DIAGNOSIS — R7303 Prediabetes: Secondary | ICD-10-CM

## 2013-07-08 DIAGNOSIS — E785 Hyperlipidemia, unspecified: Secondary | ICD-10-CM

## 2013-07-08 DIAGNOSIS — Z23 Encounter for immunization: Secondary | ICD-10-CM

## 2013-07-08 LAB — HEMOGLOBIN A1C: HEMOGLOBIN A1C: 6.5 % (ref 4.6–6.5)

## 2013-07-08 MED ORDER — SIMVASTATIN 10 MG PO TABS: 10.0000 mg | ORAL_TABLET | Freq: Every day | ORAL | Status: DC

## 2013-07-08 MED ORDER — METFORMIN HCL ER 500 MG PO TB24: 500.0000 mg | ORAL_TABLET | Freq: Every day | ORAL | Status: DC

## 2013-07-08 MED ORDER — LISINOPRIL-HYDROCHLOROTHIAZIDE 20-25 MG PO TABS
1.0000 | ORAL_TABLET | Freq: Two times a day (BID) | ORAL | Status: DC
Start: 2013-07-08 — End: 2013-10-17

## 2013-07-08 NOTE — Progress Notes (Signed)
Pre visit review using our clinic review tool, if applicable. No additional management support is needed unless otherwise documented below in the visit note. 

## 2013-07-08 NOTE — Patient Instructions (Addendum)
Read about dash diet. It is simply a healthy way to eat. Also, you can visit the dash website for recipes & more. Take a 30 minute walk every day or at least 2, 15  Minute walks. Start Celexa at night. Take blood pressure medicine twice daily. When crestor samples run out, start simvastatin-it may be cheaper. See you this summer.    DASH Diet The DASH diet stands for "Dietary Approaches to Stop Hypertension." It is a healthy eating plan that has been shown to reduce high blood pressure (hypertension) in as little as 14 days, while also possibly providing other significant health benefits. These other health benefits include reducing the risk of breast cancer after menopause and reducing the risk of type 2 diabetes, heart disease, colon cancer, and stroke. Health benefits also include weight loss and slowing kidney failure in patients with chronic kidney disease.  DIET GUIDELINES  Limit salt (sodium). Your diet should contain less than 1500 mg of sodium daily.  Limit refined or processed carbohydrates. Your diet should include mostly whole grains. Desserts and added sugars should be used sparingly.  Include small amounts of heart-healthy fats. These types of fats include nuts, oils, and tub margarine. Limit saturated and trans fats. These fats have been shown to be harmful in the body. CHOOSING FOODS  The following food groups are based on a 2000 calorie diet. See your Registered Dietitian for individual calorie needs. Grains and Grain Products (6 to 8 servings daily)  Eat More Often: Whole-wheat bread, brown rice, whole-grain or wheat pasta, quinoa, popcorn without added fat or salt (air popped).  Eat Less Often: White bread, white pasta, white rice, cornbread. Vegetables (4 to 5 servings daily)  Eat More Often: Fresh, frozen, and canned vegetables. Vegetables may be raw, steamed, roasted, or grilled with a minimal amount of fat.  Eat Less Often/Avoid: Creamed or fried vegetables.  Vegetables in a cheese sauce. Fruit (4 to 5 servings daily)  Eat More Often: All fresh, canned (in natural juice), or frozen fruits. Dried fruits without added sugar. One hundred percent fruit juice ( cup [237 mL] daily).  Eat Less Often: Dried fruits with added sugar. Canned fruit in light or heavy syrup. Foot LockerLean Meats, Fish, and Poultry (2 servings or less daily. One serving is 3 to 4 oz [85-114 g]).  Eat More Often: Ninety percent or leaner ground beef, tenderloin, sirloin. Round cuts of beef, chicken breast, [place]key breast. All fish. Grill, bake, or broil your meat. Nothing should be fried.  Eat Less Often/Avoid: Fatty cuts of meat, [place]key, or chicken leg, thigh, or wing. Fried cuts of meat or fish. Dairy (2 to 3 servings)  Eat More Often: Low-fat or fat-free milk, low-fat plain or light yogurt, reduced-fat or part-skim cheese.  Eat Less Often/Avoid: Milk (whole, 2%).Whole milk yogurt. Full-fat cheeses. Nuts, Seeds, and Legumes (4 to 5 servings per week)  Eat More Often: All without added salt.  Eat Less Often/Avoid: Salted nuts and seeds, canned beans with added salt. Fats and Sweets (limited)  Eat More Often: Vegetable oils, tub margarines without trans fats, sugar-free gelatin. Mayonnaise and salad dressings.  Eat Less Often/Avoid: Coconut oils, palm oils, butter, stick margarine, cream, half and half, cookies, candy, pie. FOR MORE INFORMATION The Dash Diet Eating Plan: www.dashdiet.org Document Released: 02/16/2011 Document Revised: 05/22/2011 Document Reviewed: 02/16/2011 [facility] Patient Information 2014 [place], [place].

## 2013-07-08 NOTE — Telephone Encounter (Signed)
pls call pt: Advise Diabetes markers look better now that he is taking metformin. Continue as prescribed.

## 2013-07-08 NOTE — Assessment & Plan Note (Addendum)
Reports fatigue-may be medication related. Continue at current dose: qd. Check TSH in 3 mos. Take vitamin D3 5000 iu qd.

## 2013-07-08 NOTE — Progress Notes (Signed)
Subjective:     [name] is a 38 y.o. male who presents for follow up of chronic conditions: HTN, hyperlipidemia, depression, hypothyroidism, and obesity. He states he has been taking all meds as prescribed. He has eliminated french fries from diet and is making effort to exercise several times week. He thinks celexa makes him tired. He takes it in am.  The following portions of the patient's history were reviewed and updated as appropriate: allergies, current medications, past medical history, past social history, past surgical history and problem list.  Review of Systems Constitutional: negative for fatigue and fevers Respiratory: negative for cough Cardiovascular: negative for chest pressure/discomfort, lower extremity edema and palpitations Gastrointestinal: occasional heartburn, usually at night, takes tums about twice/week with relief Musculoskeletal:occasional back pain, lifts & moves furniture X 9 years    Objective:    BP 154/110  Pulse 75  Temp(Src) 98 F (36.7 C) (Temporal)  Ht 6' (1.829 m)  Wt 312 lb (141.522 kg)  BMI 42.31 kg/m2  SpO2 100% BP 154/110  Pulse 75  Temp(Src) 98 F (36.7 C) (Temporal)  Ht 6' (1.829 m)  Wt 312 lb (141.522 kg)  BMI 42.31 kg/m2  SpO2 100% General appearance: alert, cooperative, appears stated age and no distress Head: Normocephalic, without obvious abnormality, atraumatic Eyes: negative findings: lids and lashes normal and conjunctivae and sclerae normal Lungs: clear to auscultation bilaterally Heart: regular rate and rhythm, S1, S2 normal, no murmur, click, rub or gallop Extremities: extremities normal, atraumatic, no cyanosis or edema Pulses: 2+ and symmetric    Assessment:     1. Prediabetes - Hemoglobin A1c METFORMIN 500 mg qd 2. Essential hypertension, benign - lisinopril-hydrochlorothiazide (PRINZIDE,ZESTORETIC) 20-25 MG per tablet; Take 1 tablet by mouth 2 (two) times daily.  Dispense: 60 tablet; Refill: 3 3. Other and  unspecified hyperlipidemia Continue crestor until runs out, then start: - simvastatin (ZOCOR) 10 MG tablet; Take 1 tablet (10 mg total) by mouth at bedtime.  Dispense: 30 tablet; Refill: 3 4. Need for Tdap vaccination - Tdap vaccine greater than or equal to 7yo IM 5. Adjustment disorder with mixed anxiety and depressed mood celexa 40 mg qhs 6. Unspecified hypothyroidism 75 mcg levothyroxine      Plan:     See problem list for complete A&P

## 2013-07-08 NOTE — Assessment & Plan Note (Signed)
Current med: celexa 40 mg qd. Pt thinks making him feel tired-takes in am. Take at night.

## 2013-07-08 NOTE — Assessment & Plan Note (Signed)
Continue crestor at current dose. Samples given today for 2 weeks. Will switch to simvastatin when runs out of crestor. Lipids in 3 mos. Encourage daily exercise.

## 2013-07-08 NOTE — Assessment & Plan Note (Signed)
Poor control on lisinopril/HCTZ 20/25 qd. Pt has made some diet changes, is making effort to exercise.  DASH diet. Increase med to bid. Check blood pressure at home, call if under 110/60. F/u 3 mos.

## 2013-07-08 NOTE — Assessment & Plan Note (Signed)
Metformin 500 mg qd. Last A1c improved on this dose: 6.5 from 6.7. Continue.

## 2013-07-09 NOTE — Telephone Encounter (Signed)
Patient notified of results. Pt expressed understanding.

## 2013-07-11 ENCOUNTER — Telehealth: Payer: Self-pay

## 2013-07-11 NOTE — Telephone Encounter (Signed)
Relevant patient education mailed to patient.  

## 2013-10-06 ENCOUNTER — Ambulatory Visit: Payer: 59 | Admitting: Nurse Practitioner

## 2013-10-17 ENCOUNTER — Ambulatory Visit (INDEPENDENT_AMBULATORY_CARE_PROVIDER_SITE_OTHER): Payer: 59 | Admitting: Nurse Practitioner

## 2013-10-17 ENCOUNTER — Encounter: Payer: Self-pay | Admitting: Nurse Practitioner

## 2013-10-17 VITALS — BP 124/94 | HR 77 | Temp 97.4°F | Ht 72.0 in | Wt 301.0 lb

## 2013-10-17 DIAGNOSIS — E039 Hypothyroidism, unspecified: Secondary | ICD-10-CM

## 2013-10-17 DIAGNOSIS — I10 Essential (primary) hypertension: Secondary | ICD-10-CM

## 2013-10-17 DIAGNOSIS — B351 Tinea unguium: Secondary | ICD-10-CM

## 2013-10-17 DIAGNOSIS — F411 Generalized anxiety disorder: Secondary | ICD-10-CM

## 2013-10-17 DIAGNOSIS — E118 Type 2 diabetes mellitus with unspecified complications: Secondary | ICD-10-CM

## 2013-10-17 DIAGNOSIS — E785 Hyperlipidemia, unspecified: Secondary | ICD-10-CM

## 2013-10-17 DIAGNOSIS — E559 Vitamin D deficiency, unspecified: Secondary | ICD-10-CM

## 2013-10-17 LAB — HEMOGLOBIN A1C: Hgb A1c MFr Bld: 6.7 % — ABNORMAL HIGH (ref 4.6–6.5)

## 2013-10-17 LAB — COMPREHENSIVE METABOLIC PANEL
ALK PHOS: 59 U/L (ref 39–117)
ALT: 34 U/L (ref 0–53)
AST: 31 U/L (ref 0–37)
Albumin: 4.3 g/dL (ref 3.5–5.2)
BUN: 17 mg/dL (ref 6–23)
CO2: 28 mEq/L (ref 19–32)
Calcium: 9.6 mg/dL (ref 8.4–10.5)
Chloride: 97 mEq/L (ref 96–112)
Creatinine, Ser: 1 mg/dL (ref 0.4–1.5)
GFR: 103.99 mL/min (ref >60.00)
Glucose, Bld: 82 mg/dL (ref 70–99)
Potassium: 3.7 mEq/L (ref 3.5–5.1)
SODIUM: 134 meq/L — AB (ref 135–145)
TOTAL PROTEIN: 7.8 g/dL (ref 6.0–8.3)
Total Bilirubin: 0.5 mg/dL (ref 0.2–1.2)

## 2013-10-17 LAB — LIPID PANEL
CHOL/HDL RATIO: 7
Cholesterol: 279 mg/dL — ABNORMAL HIGH (ref 0–200)
HDL: 42.7 mg/dL (ref >39.00)
LDL CALC: 221 mg/dL — AB (ref 0–99)
NonHDL: 236.3
Triglycerides: 77 mg/dL (ref 0.0–149.0)
VLDL: 15.4 mg/dL (ref 0.0–40.0)

## 2013-10-17 LAB — VITAMIN D 25 HYDROXY (VIT D DEFICIENCY, FRACTURES): VITD: 29.82 ng/mL — AB (ref 30.00–100.00)

## 2013-10-17 LAB — MICROALBUMIN / CREATININE URINE RATIO
Creatinine,U: 137 mg/dL
MICROALB/CREAT RATIO: 0.2 mg/g (ref 0.0–30.0)
Microalb, Ur: 0.3 mg/dL (ref 0.0–1.9)

## 2013-10-17 LAB — TSH: TSH: 3.37 u[IU]/mL (ref 0.35–4.50)

## 2013-10-17 MED ORDER — LORAZEPAM 1 MG PO TABS: 1.0000 mg | ORAL_TABLET | Freq: Every day | ORAL | Status: DC | PRN

## 2013-10-17 MED ORDER — LISINOPRIL-HYDROCHLOROTHIAZIDE 20-25 MG PO TABS: 2.0000 | ORAL_TABLET | Freq: Every day | ORAL | Status: DC

## 2013-10-17 MED ORDER — AMLODIPINE BESYLATE 10 MG PO TABS: 10.0000 mg | ORAL_TABLET | Freq: Every day | ORAL | Status: DC

## 2013-10-17 MED ORDER — CITALOPRAM HYDROBROMIDE 20 MG PO TABS: 20.0000 mg | ORAL_TABLET | Freq: Every day | ORAL | Status: DC

## 2013-10-17 NOTE — Patient Instructions (Signed)
Healthy diet: no refined sugar or refined flour: anything sweet when you eat or drink it, white bread, rolls, muffins, bagels, pasta & rice. Replace these foods with whole grains (cereals & breads that have at least 4 grams fiber per serving), brown rice, quinoa, whole wheat & oats. Also eat plant foods like beans, peas, other vegetables and fresh fruit with every meal. Limit salt to 2400 mg daily.  Great job with 11 lb. Weight loss! Keep walking!  Toe nails: soak every day in white vinegar. Add 5 drops tea tree oil to vinegar. Do this for at least 1 month.  Take celexa before you go to bed. Call me if lower dose not working.  Take metformin. Take crestor until you run out, then start simvastatin.  Take amlodopine before bed & lisinopril at the start of your day.   Great to see you.  Low-Sodium Eating Plan Sodium raises blood pressure and causes water to be held in the body. Getting less sodium from food will help lower your blood pressure, reduce any swelling, and protect your heart, liver, and kidneys. We get sodium by adding salt (sodium chloride) to food. Most of our sodium comes from canned, boxed, and frozen foods. Restaurant foods, fast foods, and pizza are also very high in sodium. Even if you take medicine to lower your blood pressure or to reduce fluid in your body, getting less sodium from your food is important. WHAT IS MY PLAN? Most people should limit their sodium intake to 2,300 mg a day. Your health care provider recommends that you limit your sodium intake to __________ a day.  WHAT DO I NEED TO KNOW ABOUT THIS EATING PLAN? For the low-sodium eating plan, you will follow these general guidelines:  Choose foods with a % Daily Value for sodium of less than 5% (as listed on the food label).   Use salt-free seasonings or herbs instead of table salt or sea salt.   Check with your health care provider or pharmacist before using salt substitutes.   Eat fresh foods.  Eat  more vegetables and fruits.  Limit canned vegetables. If you do use them, rinse them well to decrease the sodium.   Limit cheese to 1 oz (28 g) per day.   Eat lower-sodium products, often labeled as "lower sodium" or "no salt added."  Avoid foods that contain monosodium glutamate (MSG). MSG is sometimes added to [place] food and some canned foods.  Check food labels (Nutrition Facts labels) on foods to learn how much sodium is in one serving.  Eat more home-cooked food and less restaurant, buffet, and fast food.  When eating at a restaurant, ask that your food be prepared with less salt or none, if possible.  HOW DO I READ FOOD LABELS FOR SODIUM INFORMATION? The Nutrition Facts label lists the amount of sodium in one serving of the food. If you eat more than one serving, you must multiply the listed amount of sodium by the number of servings. Food labels may also identify foods as:  Sodium free--Less than 5 mg in a serving.  Very low sodium--35 mg or less in a serving.  Low sodium--140 mg or less in a serving.  Light in sodium--50% less sodium in a serving. For example, if a food that usually has 300 mg of sodium is changed to become light in sodium, it will have 150 mg of sodium.  Reduced sodium--25% less sodium in a serving. For example, if a food that usually has 400  mg of sodium is changed to reduced sodium, it will have 300 mg of sodium. WHAT FOODS CAN I EAT? Grains Low-sodium cereals, including oats, puffed wheat and rice, and shredded wheat cereals. Low-sodium crackers. Unsalted rice and pasta. Lower-sodium bread.  Vegetables Frozen or fresh vegetables. Low-sodium or reduced-sodium canned vegetables. Low-sodium or reduced-sodium tomato sauce and paste. Low-sodium or reduced-sodium tomato and vegetable juices.  Fruits Fresh, frozen, and canned fruit. Fruit juice.  Meat and Other Protein Products Low-sodium canned tuna and salmon. Fresh or frozen meat, poultry,  seafood, and fish. Lamb. Unsalted nuts. Dried beans, peas, and lentils without added salt. Unsalted canned beans. Homemade soups without salt. Eggs.  Dairy Milk. Soy milk. Ricotta cheese. Low-sodium or reduced-sodium cheeses. Yogurt.  Condiments Fresh and dried herbs and spices. Salt-free seasonings. Onion and garlic powders. Low-sodium varieties of mustard and ketchup. Lemon juice.  Fats and Oils Reduced-sodium salad dressings. Unsalted butter.  Other Unsalted popcorn and pretzels.  The items listed above may not be a complete list of recommended foods or beverages. Contact your dietitian for more options. WHAT FOODS ARE NOT RECOMMENDED? Grains Instant hot cereals. Bread stuffing, pancake, and biscuit mixes. Croutons. Seasoned rice or pasta mixes. Noodle soup cups. Boxed or frozen macaroni and cheese. Self-rising flour. Regular salted crackers. Vegetables Regular canned vegetables. Regular canned tomato sauce and paste. Regular tomato and vegetable juices. Frozen vegetables in sauces. Salted french fries. Olives. Rosita Fire. Relishes. Sauerkraut. Salsa. Meat and Other Protein Products Salted, canned, smoked, spiced, or pickled meats, seafood, or fish. Bacon, ham, sausage, hot dogs, corned beef, chipped beef, and packaged luncheon meats. Salt pork. Jerky. Pickled herring. Anchovies, regular canned tuna, and sardines. Salted nuts. Dairy Processed cheese and cheese spreads. Cheese curds. Blue cheese and cottage cheese. Buttermilk.  Condiments Onion and garlic salt, seasoned salt, table salt, and sea salt. Canned and packaged gravies. Worcestershire sauce. Tartar sauce. Barbecue sauce. Teriyaki sauce. Soy sauce, including reduced sodium. Steak sauce. Fish sauce. Oyster sauce. Cocktail sauce. Horseradish. Regular ketchup and mustard. Meat flavorings and tenderizers. Bouillon cubes. Hot sauce. Tabasco sauce. Marinades. Taco seasonings. Relishes. Fats and Oils Regular salad dressings.  Salted butter. Margarine. Ghee. Bacon fat.  Other Potato and tortilla chips. Corn chips and puffs. Salted popcorn and pretzels. Canned or dried soups. Pizza. Frozen entrees and pot pies.  The items listed above may not be a complete list of foods and beverages to avoid. Contact your dietitian for more information. Document Released: 08/19/2001 Document Revised: 03/04/2013 Document Reviewed: 01/01/2013 [facility] Patient Information 2015 [place], [place]. This information is not intended to replace advice given to you by your health care provider. Make sure you discuss any questions you have with your health care provider.

## 2013-10-17 NOTE — Progress Notes (Signed)
Pre visit review using our clinic review tool, if applicable. No additional management support is needed unless otherwise documented below in the visit note. 

## 2013-10-21 ENCOUNTER — Telehealth: Payer: Self-pay | Admitting: Nurse Practitioner

## 2013-10-21 DIAGNOSIS — B351 Tinea unguium: Secondary | ICD-10-CM | POA: Insufficient documentation

## 2013-10-21 NOTE — Telephone Encounter (Signed)
LMOVM for pt to return call 

## 2013-10-21 NOTE — Assessment & Plan Note (Signed)
Continues on metformin 500 mg qd Last A1C showed mild improvement: 6.5 from 6.7 Lost 11 pounds-walking more on new job. Stressed diet changes for high fiber carbs, no sugar for weight loss. A1C, lipids today F/u 3 mos/. Will increase metformin if no improvement in A1C.

## 2013-10-21 NOTE — Assessment & Plan Note (Signed)
Soak toe in white vinegar daily for 4 to 6 weeks. Add few drops tea tree oil. foot

## 2013-10-21 NOTE — Assessment & Plan Note (Signed)
Feels tired on higher dose of celexa (40mg ). Reduce med to 20 mg & take before bedtime rather than at start of day. Shift worker. Will prescribe ativan for occasional use for anxiety.

## 2013-10-21 NOTE — Telephone Encounter (Signed)
pls call pt: Advise Vit D improved, but still has way to go-keep taking 2000 iu daily. Diabetes not as good as 3 mos ago-did he run out of metformin? If so, get back on it. Thyroid not well controlled-did he run out of levothyroxine? If still taking-remind him to take 1 hour before food or drink at start of day. For best absorption. Did he run out of cholesterol medicine? Needs to take it. Keep follow up appt. & make diet changes discussed.

## 2013-10-21 NOTE — Assessment & Plan Note (Signed)
Continues on synthroid 75 mcg Recent 11 lb weight loss-attributes to walking more. TSH today.

## 2013-10-21 NOTE — Progress Notes (Signed)
Subjective:     [name] is a 38 y.o. male presents for f/u of chronic conditions: HTN, hyperlipidemia, DM, hypothyroidism, vit D def, anxiety, and he c/o toenail fungus. [name] states he is taking all meds except amlodopine-ran out few days ago. BP running high at work 180/100. No symptoms. He lost 11 lbs. Recently, attributes to walking more on new job. Constipation resolved -thinks r/t taking Vitamin D. He is feeling more tored & wonders if it is due to higher dose of celexa. Last OV dose was increased to 40 mg qd from 20 mg. He was told to take at night, but does not recall-he has continued to take it at start of day.  He has made some diet changes-wife is not frying foods. He notices 1 yellow toenail.  The following portions of the patient's history were reviewed and updated as appropriate: allergies, current medications, past medical history, past social history, past surgical history and problem list.  Review of Systems Pertinent items are noted in HPI.    Objective:    BP 124/94  Pulse 77  Temp(Src) 97.4 F (36.3 C) (Temporal)  Ht 6' (1.829 m)  Wt 301 lb (136.533 kg)  BMI 40.81 kg/m2  SpO2 96% BP 124/94  Pulse 77  Temp(Src) 97.4 F (36.3 C) (Temporal)  Ht 6' (1.829 m)  Wt 301 lb (136.533 kg)  BMI 40.81 kg/m2  SpO2 96% General appearance: alert, cooperative, appears stated age and no distress Head: Normocephalic, without obvious abnormality, atraumatic Eyes: negative findings: lids and lashes normal, conjunctivae and sclerae normal and wears glasses Lungs: clear to auscultation bilaterally Heart: no murmur, occasional pause Extremities: extremities normal, atraumatic, no cyanosis or edema Pulses: 2+ and symmetric    Assessment:   1. Essential hypertension, benign - Comprehensive metabolic panel - Microalbumin / creatinine urine ratio - lisinopril-hydrochlorothiazide (PRINZIDE,ZESTORETIC) 20-25 MG per tablet; Take 2 tablets by mouth daily.  Dispense: 60  tablet; Refill: 5 Occasional pause on heart auscultation. ECG next visit. - amLODipine (NORVASC) 10 MG tablet; Take 1 tablet (10 mg total) by mouth at bedtime.  Dispense: 30 tablet; Refill: 5  2. Anxiety state, unspecified - citalopram (CELEXA) 20 MG tablet; Take 1 tablet (20 mg total) by mouth at bedtime.  Dispense: 30 tablet; Refill: 5 - LORazepam (ATIVAN) 1 MG tablet; Take 1 tablet (1 mg total) by mouth daily as needed for anxiety.  Dispense: 20 tablet; Refill: 1  3. Unspecified vitamin D deficiency - Vit D  25 hydroxy (rtn osteoporosis monitoring)  4. Unspecified hypothyroidism - TSH  5. Type II or unspecified type diabetes mellitus with unspecified complication, not stated as uncontrolled - Hemoglobin A1c - Lipid panel  7. HLD (hyperlipidemia)  8. Nail fungus  See problem list for complete A&P See pt instructions. F/u 3 mos

## 2013-10-21 NOTE — Assessment & Plan Note (Signed)
Still has crestor samples.  Will switch to simvastatin. Lipids today. Stressed no fried foods, refined sugars or flours. Walking more on new job-lost 11 lbs. Since last OV.

## 2013-10-21 NOTE — Assessment & Plan Note (Addendum)
Fair control on lisinopril/HCTZ 20/25 1 T bid. He ran out of amlodopine 10 mg. BP at work recently was 180/100. No evidence of end organ damage. Limit salt to 2400 mg. Exercise. Diet changes to lose weight. COntinue meds. Take lisinopril/HCTZ 2T qam & amlodopine qpm. CMET, urine microalb Occasional pause on heart auscultation. ECG next visit if still present. Pt denies palpitations, chest pressure or pain. F/u 3 mos

## 2013-10-21 NOTE — Assessment & Plan Note (Signed)
Level 15. Stressed importance of taking 5000 iu daily. Pt states he is taking supplement & it has helped w/constipation. Check level today.

## 2013-10-29 NOTE — Telephone Encounter (Signed)
Patient notified of results. Patient stated that he started taking metformin the day after his last ov. Patient has been taking levothyroxine but not the correct way. Advised pt to take levo 1 hour before food or drink at start of day. Patient also stated that he has been taking Crestor 5 mg every day.

## 2013-11-03 ENCOUNTER — Telehealth: Payer: Self-pay | Admitting: Nurse Practitioner

## 2013-11-03 NOTE — Telephone Encounter (Signed)
Patient is requesting a call back, he is experiencing fatigue and wants to know if this could be due to his thyroid levels. Offered appointment but patient just started a new job and can not get off work

## 2013-11-03 NOTE — Telephone Encounter (Signed)
May be thyroid. He was instructed to start taking 1 hr before food or beverage at start of his day for best absorption. Once he has done that for 6 weeks, he should come in for lab appt. Did he cut back on antidepressant? If thyroid lab comes back nml, I will refer to cardiology.

## 2013-11-04 NOTE — Telephone Encounter (Signed)
Called pt, unable to leave voicemail. Will try to call later.

## 2013-11-06 NOTE — Telephone Encounter (Signed)
Spoke with pt, he has cut back on his antidepressant. I explained message from [facility]. Pt understood. He also thinks this may be coming from sleep apnea. He states his wife says he snores and he has been reading up on it. I advised him that I would tell you this and the only way to find out is to get a sleep study. Please advise.

## 2013-11-06 NOTE — Telephone Encounter (Signed)
Called pt, unable to leave voicemail

## 2013-11-07 ENCOUNTER — Other Ambulatory Visit: Payer: Self-pay | Admitting: Nurse Practitioner

## 2013-11-07 DIAGNOSIS — G478 Other sleep disorders: Secondary | ICD-10-CM

## 2013-11-07 NOTE — Telephone Encounter (Signed)
Yes he would like a sleep study and is ok to see Pulmonology.

## 2013-11-07 NOTE — Telephone Encounter (Signed)
PLs clarify: if pt wants sleep study I will order it. He will see pulmonology for eval. They will do test.

## 2013-12-12 ENCOUNTER — Telehealth: Payer: Self-pay | Admitting: *Deleted

## 2013-12-12 NOTE — Telephone Encounter (Signed)
Patient left vm for return call. Called pt back. Pt not home, so spoke to pt's wife instead. Pt wanted to know about sleep study referral. Pt has not heard from anyone yet.

## 2013-12-12 NOTE — Telephone Encounter (Signed)
Will check on referral for sleep study.

## 2013-12-16 ENCOUNTER — Institutional Professional Consult (permissible substitution): Payer: 59 | Admitting: Pulmonary Disease

## 2013-12-16 NOTE — Telephone Encounter (Signed)
Patient informed that he has appt with [facility] 12/19/2013 at 9 am.

## 2013-12-19 ENCOUNTER — Ambulatory Visit (INDEPENDENT_AMBULATORY_CARE_PROVIDER_SITE_OTHER): Payer: 59 | Admitting: Pulmonary Disease

## 2013-12-19 ENCOUNTER — Encounter: Payer: Self-pay | Admitting: Pulmonary Disease

## 2013-12-19 VITALS — BP 142/94 | HR 70 | Temp 98.3°F | Ht 72.0 in | Wt 306.6 lb

## 2013-12-19 DIAGNOSIS — F458 Other somatoform disorders: Secondary | ICD-10-CM | POA: Insufficient documentation

## 2013-12-19 DIAGNOSIS — G4733 Obstructive sleep apnea (adult) (pediatric): Secondary | ICD-10-CM

## 2013-12-19 NOTE — Patient Instructions (Signed)
Will arrange for sleep study Will call to arrange for follow up after sleep study reviewed 

## 2013-12-19 NOTE — Assessment & Plan Note (Signed)
He has snoring, sleep disruption, and daytime sleepiness.  He has hx of refractory HTN and DM.  He has prior hx of sleep apnea.  I am concerned he still has sleep apnea.  We discussed how sleep apnea can affect various health problems including risks for hypertension, cardiovascular disease, and diabetes.  We also discussed how sleep disruption can increase risks for accident, such as while driving.  Weight loss as a means of improving sleep apnea was also reviewed.  Additional treatment options discussed were CPAP therapy, oral appliance, and surgical intervention.  To further assess will need to arrange for in lab sleep study.

## 2013-12-19 NOTE — Assessment & Plan Note (Signed)
He reports intermittent episodes of bruxism while asleep.  He has appointment with his dentist schedule.  Will further assess during in lab sleep study.

## 2013-12-19 NOTE — Progress Notes (Deleted)
   Subjective:    Patient ID: [name], male    DOB: 23-Feb-1976, 38 y.o.   MRN: [id number]  HPI    Review of Systems  Constitutional: Negative for fever and unexpected weight change.  HENT: Positive for congestion. Negative for dental problem, ear pain, nosebleeds, postnasal drip, rhinorrhea, sinus pressure, sneezing, sore throat and trouble swallowing.        Allergies  Eyes: Negative for redness and itching.  Respiratory: Negative for cough, chest tightness, shortness of breath and wheezing.   Cardiovascular: Positive for palpitations. Negative for leg swelling.  Gastrointestinal: Negative for nausea and vomiting.  Genitourinary: Negative for dysuria.  Musculoskeletal: Negative for joint swelling.  Skin: Negative for rash.  Neurological: Negative for headaches.  Hematological: Does not bruise/bleed easily.  Psychiatric/Behavioral: Negative for dysphoric mood. The patient is not nervous/anxious.        Objective:   Physical Exam        Assessment & Plan:

## 2013-12-19 NOTE — Progress Notes (Signed)
Chief Complaint  Patient presents with  . SLEEP CONSULT    Referred by Dr Alben SpittleWeaver. Sleep Study 2004?    History of Present Illness: Frank ArenasCarlos R Farley is a 38 y.o. male for evaluation of sleep problems.  He has history of sleep apnea after sleep study in 2004.  He was on CPAP for about one year, but has not used for years.  He does not remember why his CPAP was discontinued.  [place] nasal septal [place]  This helped his snoring initially, but he has started snoring again.  He has noticed progressively more trouble with daytime sleepiness.  He was seen by his PCP recently, and there was concern presence of sleep apnea could be contributing to his difficult to control blood pressure and diabetes.  He works 2nd shift (4 pm to 2 am).  He goes to sleep at 10 pm on days he is not working.  He falls asleep after 5 minutes.  He wakes up occasional to use the bathroom.  He gets out of bed at 1 pm.  He feels tired when he wakes up.  He denies morning headache.  He does not use anything to help him fall sleep or stay awake.  He denies sleep walking, sleep talking, or nightmares.  There is no history of restless legs.  He denies sleep hallucinations, sleep paralysis, or cataplexy.  The Epworth score is 12 out of 24.  [name]  has a past medical history of Hypertension; Depression; Anxiety; Hyperlipidemia; Hypothyroidism; Sinus congestion; Obesity; Diabetes mellitus without complication; and OSA (obstructive sleep apnea).  [name]  has past surgical history that includes Hernia repair [phone](1990); Repair Right Arm Fracture (1996); Fracture surgery; and Uvulopalatopharyngoplasty (uppp)/tonsillectomy/septoplasty.  Prior to Admission medications   Medication Sig Start Date End Date Taking? Authorizing Provider  amLODipine (NORVASC) 10 MG tablet Take 1 tablet (10 mg total) by mouth at bedtime. 10/17/13  Yes Kelle DartingLayne C Weaver, NP  aspirin 81 MG chewable  tablet Chew 81 mg by mouth daily.   Yes Historical Provider, MD  Cholecalciferol (VITAMIN D PO) Take 1 tablet by mouth daily.   Yes Historical Provider, MD  citalopram (CELEXA) 20 MG tablet Take 1 tablet (20 mg total) by mouth at bedtime. 10/17/13  Yes Kelle DartingLayne C Weaver, NP  ibuprofen (ADVIL,MOTRIN) 200 MG tablet Take 200 mg by mouth every 6 (six) hours as needed.   Yes Historical Provider, MD  levothyroxine (SYNTHROID, LEVOTHROID) 75 MCG tablet Take 1 tablet (75 mcg total) by mouth daily. 03/27/13  Yes Kelle DartingLayne C Weaver, NP  lisinopril-hydrochlorothiazide (PRINZIDE,ZESTORETIC) 20-25 MG per tablet Take 2 tablets by mouth daily. 10/17/13  Yes Kelle DartingLayne C Weaver, NP  LORazepam (ATIVAN) 1 MG tablet Take 1 tablet (1 mg total) by mouth daily as needed for anxiety. 10/17/13  Yes Kelle DartingLayne C Weaver, NP  metFORMIN (GLUCOPHAGE XR) 500 MG 24 hr tablet Take 1 tablet (500 mg total) by mouth daily with breakfast. 07/08/13  Yes Kelle DartingLayne C Weaver, NP  simvastatin (ZOCOR) 10 MG tablet Take 1 tablet (10 mg total) by mouth at bedtime. 07/08/13  Yes Kelle DartingLayne C Weaver, NP  sodium chloride (OCEAN) 0.65 % nasal spray Place 1 spray into the nose as needed. Nasal dryness   Yes Historical Provider, MD    Allergies  Allergen Reactions  . Cymbalta [Duloxetine Hcl] Rash    His family history includes Breast cancer in his paternal grandmother; Down syndrome in his brother; Heart  attack in his father; Hypertension in his father and mother; Kidney disease in his maternal grandfather.  He  reports that he has never smoked. He does not have any smokeless tobacco history on file. He reports that he does not drink alcohol.  Review of Systems  Constitutional: Negative for fever and unexpected weight change.  HENT: Positive for congestion. Negative for dental problem, ear pain, nosebleeds, postnasal drip, rhinorrhea, sinus pressure, sneezing, sore throat and trouble swallowing.        Allergies  Eyes: Negative for redness and itching.  Respiratory:  Negative for cough, chest tightness, shortness of breath and wheezing.   Cardiovascular: Positive for palpitations. Negative for leg swelling.  Gastrointestinal: Negative for nausea and vomiting.  Genitourinary: Negative for dysuria.  Musculoskeletal: Negative for joint swelling.  Skin: Negative for rash.  Neurological: Negative for headaches.  Hematological: Does not bruise/bleed easily.  Psychiatric/Behavioral: Negative for dysphoric mood. The patient is not nervous/anxious.    Physical Exam:  General - No distress ENT - No sinus tenderness, no oral exudate, no LAN, no thyromegaly, TM clear, pupils equal/reactive, s/p UPPP, MP 3 Cardiac - s1s2 regular, no murmur, pulses symmetric Chest - No wheeze/rales/dullness, good air entry, normal respiratory excursion Back - No focal tenderness Abd - Soft, non-tender, no organomegaly, + bowel sounds Ext - No edema Neuro - Normal strength, cranial nerves intact Skin - No rashes Psych - Normal mood, and behavior  Assessment/plan:  Coralyn HellingVineet Giannina Bartolome, M.D. Pager [phone]

## 2013-12-22 ENCOUNTER — Telehealth: Payer: Self-pay | Admitting: Nurse Practitioner

## 2013-12-22 NOTE — Telephone Encounter (Signed)
Please advise 

## 2013-12-22 NOTE — Telephone Encounter (Signed)
Patient's amlodipine has gone up. It is very expensive now, is there something else that can be Rx? The cholesterol medication is also expensive, can he switch that too? Patient has been feeling very tired & he still feels like his head is "stuffy". He has been taking the allergy medication but it isn't helping. The patient has also been taking his thyroid medication & hasn't noticed any difference.

## 2013-12-23 ENCOUNTER — Other Ambulatory Visit: Payer: Self-pay | Admitting: Nurse Practitioner

## 2013-12-23 DIAGNOSIS — E7800 Pure hypercholesterolemia, unspecified: Secondary | ICD-10-CM

## 2013-12-23 DIAGNOSIS — I10 Essential (primary) hypertension: Secondary | ICD-10-CM

## 2013-12-23 MED ORDER — CLONIDINE HCL 0.1 MG PO TABS: 0.1000 mg | ORAL_TABLET | Freq: Every day | ORAL | Status: DC

## 2013-12-23 MED ORDER — LOVASTATIN 20 MG PO TABS: 20.0000 mg | ORAL_TABLET | Freq: Every day | ORAL | Status: DC

## 2013-12-23 NOTE — Telephone Encounter (Signed)
Patient's wife returned call and was given info.

## 2013-12-23 NOTE — Telephone Encounter (Signed)
Please ask pt to call [facility] de[facility] [phone] & ask to speak w/Sonja Gunn regarding pharmacy assistance program. If he cannot get assistance, He should call insurance to ask what meds are cheaper option for his policy.  Fatigue may be due to sleep apnea. He should have study scheduled.  He should use Neilmed sinus rinse daily for "stuffy head". He should schedule appointment is he continues to have concerns.

## 2013-12-23 NOTE — Telephone Encounter (Signed)
Pt's wife will call back for info.

## 2014-01-23 ENCOUNTER — Encounter: Payer: Self-pay | Admitting: Nurse Practitioner

## 2014-01-23 ENCOUNTER — Ambulatory Visit (INDEPENDENT_AMBULATORY_CARE_PROVIDER_SITE_OTHER): Payer: 59 | Admitting: Nurse Practitioner

## 2014-01-23 VITALS — BP 130/100 | HR 64 | Temp 97.9°F | Ht 72.0 in | Wt 304.0 lb

## 2014-01-23 DIAGNOSIS — I10 Essential (primary) hypertension: Secondary | ICD-10-CM

## 2014-01-23 DIAGNOSIS — E039 Hypothyroidism, unspecified: Secondary | ICD-10-CM

## 2014-01-23 DIAGNOSIS — E7800 Pure hypercholesterolemia, unspecified: Secondary | ICD-10-CM

## 2014-01-23 DIAGNOSIS — F411 Generalized anxiety disorder: Secondary | ICD-10-CM

## 2014-01-23 DIAGNOSIS — E785 Hyperlipidemia, unspecified: Secondary | ICD-10-CM

## 2014-01-23 DIAGNOSIS — E669 Obesity, unspecified: Secondary | ICD-10-CM

## 2014-01-23 DIAGNOSIS — J3089 Other allergic rhinitis: Secondary | ICD-10-CM | POA: Insufficient documentation

## 2014-01-23 DIAGNOSIS — E119 Type 2 diabetes mellitus without complications: Secondary | ICD-10-CM

## 2014-01-23 DIAGNOSIS — E1169 Type 2 diabetes mellitus with other specified complication: Secondary | ICD-10-CM

## 2014-01-23 LAB — BASIC METABOLIC PANEL
BUN: 19 mg/dL (ref 6–23)
CHLORIDE: 105 meq/L (ref 96–112)
CO2: 21 mEq/L (ref 19–32)
Calcium: 9.4 mg/dL (ref 8.4–10.5)
Creatinine, Ser: 1.1 mg/dL (ref 0.4–1.5)
GFR: 102.69 mL/min (ref >60.00)
Glucose, Bld: 100 mg/dL — ABNORMAL HIGH (ref 70–99)
POTASSIUM: 4.1 meq/L (ref 3.5–5.1)
Sodium: 138 mEq/L (ref 135–145)

## 2014-01-23 LAB — LIPID PANEL
CHOL/HDL RATIO: 7
CHOLESTEROL: 227 mg/dL — AB (ref 0–200)
HDL: 33.6 mg/dL — ABNORMAL LOW (ref >39.00)
LDL Cholesterol: 172 mg/dL — ABNORMAL HIGH (ref 0–99)
NonHDL: 193.4
TRIGLYCERIDES: 108 mg/dL (ref 0.0–149.0)
VLDL: 21.6 mg/dL (ref 0.0–40.0)

## 2014-01-23 LAB — HEMOGLOBIN A1C: HEMOGLOBIN A1C: 6.5 % (ref 4.6–6.5)

## 2014-01-23 LAB — TSH: TSH: 3.88 u[IU]/mL (ref 0.35–4.50)

## 2014-01-23 MED ORDER — CITALOPRAM HYDROBROMIDE 40 MG PO TABS: 40.0000 mg | ORAL_TABLET | Freq: Every day | ORAL | Status: DC

## 2014-01-23 MED ORDER — LOVASTATIN 20 MG PO TABS: 20.0000 mg | ORAL_TABLET | Freq: Every day | ORAL | Status: DC

## 2014-01-23 MED ORDER — FLUTICASONE PROPIONATE 50 MCG/ACT NA SUSP: 1.0000 | Freq: Two times a day (BID) | NASAL | Status: DC

## 2014-01-23 MED ORDER — METFORMIN HCL ER 500 MG PO TB24: 500.0000 mg | ORAL_TABLET | Freq: Every day | ORAL | Status: DC

## 2014-01-23 MED ORDER — LISINOPRIL-HYDROCHLOROTHIAZIDE 20-25 MG PO TABS: 2.0000 | ORAL_TABLET | Freq: Every day | ORAL | Status: DC

## 2014-01-23 MED ORDER — LEVOTHYROXINE SODIUM 75 MCG PO TABS: 75.0000 ug | ORAL_TABLET | Freq: Every day | ORAL | Status: DC

## 2014-01-23 MED ORDER — CLONIDINE HCL 0.1 MG PO TABS: 0.1000 mg | ORAL_TABLET | Freq: Every day | ORAL | Status: DC

## 2014-01-23 NOTE — Patient Instructions (Signed)
Continue daily meds. Continue vitamin D3 daily 1000 iu.  Daily sinus wash-Neil med sinus rinse. Use flonase nasal spray twice daily.  Get sleep study.  Cut out sugar & refined grains. All breads, cereals, tortillas, bagels, rice, muffins should have 4 grams fiber or more per serving. That means cut out biscuits. Hopefully this will prevent having to increase metformin.   See you in 3 months.  Happy Thanksgiving!

## 2014-01-23 NOTE — Progress Notes (Signed)
Pre visit review using our clinic review tool, if applicable. No additional management support is needed unless otherwise documented below in the visit note. 

## 2014-01-23 NOTE — Progress Notes (Signed)
Subjective:     [name] is a 38 y.o. male returns for f/u of HTN, hyperlipidemia, anxiety/depression, DM, hypothyroid, obesity, and new c/o "stuffy nose". He states he feels well. Htn: d/c'd amlodopine due to cost & started catapress. Pt tolerating well. BP not well controlled. He walks " a lot" on job, discussed need for sleep study & weight loss. No microalbuminuria. CV RF: gender, lipids, obesity. Lipids: d/c crestor due to cost, started mevacor, tolerating well. DM: last A1C 6.7 from 6.5. Does not want to increase metformin. Asked what diet changes he can make: cut out sugar & refined grains. All grains should have 4 gm or more per serving. Pt is willing to make changes. Thyroid: no reported S&S. Wt stable. Anxiety/depression: increased celexa to 40 mg qd-feels better, less anxious. Stuffy nose: night time is worse, pressure across brow. Using ocean spray w/mild relief.  The following portions of the patient's history were reviewed and updated as appropriate: allergies, current medications, past medical history, past social history, past surgical history and problem list.  Review of Systems Constitutional: negative for fatigue and fevers Ears, nose, mouth, throat, and face: positive for nasal congestion, negative for earaches and sore throat Cardiovascular: negative for lower extremity edema Gastrointestinal: negative for diarrhea Neurological: negative for headaches Behavioral/Psych: negative for irritability Endocrine: negative for diabetic symptoms including polydipsia, polyphagia and polyuria and temperature intolerance Allergic/Immunologic: positive for environmental allergies    Objective:    BP 130/100 mmHg  Pulse 64  Temp(Src) 97.9 F (36.6 C) (Oral)  Ht 6' (1.829 m)  Wt 304 lb (137.893 kg)  BMI 41.22 kg/m2  SpO2 95% BP 130/100 mmHg  Pulse 64  Temp(Src) 97.9 F (36.6 C) (Oral)  Ht 6' (1.829 m)  Wt 304 lb (137.893 kg)  BMI 41.22 kg/m2  SpO2 95% General  appearance: alert, cooperative, appears stated age and no distress Head: Normocephalic, without obvious abnormality, atraumatic Eyes: negative findings: lids and lashes normal, conjunctivae and sclerae normal and wearing glasses Ears: moderate cerumen bilat, TM partially occluded. L TM vessels injected Throat: lips, mucosa, and tongue normal; teeth and gums normal Lungs: clear to auscultation bilaterally Heart: regular rate and rhythm, S1, S2 normal, no murmur, click, rub or gallop and no pedal edema Pulses: 2+ and symmetric    Assessment:     1. Hypothyroidism, unspecified hypothyroidism type - TSH - levothyroxine (SYNTHROID, LEVOTHROID) 75 MCG tablet; Take 1 tablet (75 mcg total) by mouth daily.  Dispense: 30 tablet; Refill: 3  2. Generalized anxiety disorder Feels better on 40 mg qd, than 20 mg - citalopram (CELEXA) 40 MG tablet; Take 1 tablet (40 mg total) by mouth at bedtime.  Dispense: 30 tablet; Refill: 2  3. Essential hypertension, benign Not well controlled - lisinopril-hydrochlorothiazide (PRINZIDE,ZESTORETIC) 20-25 MG per tablet; Take 2 tablets by mouth daily.  Dispense: 60 tablet; Refill: 3 - cloNIDine (CATAPRES) 0.1 MG tablet; Take 1 tablet (0.1 mg total) by mouth at bedtime.  Dispense: 30 tablet; Refill: 3 -BMET  4. Type 2 diabetes mellitus without complication Cut out sugar & refined grains, if no improvement oin A1C will increase to bid - metFORMIN (GLUCOPHAGE XR) 500 MG 24 hr tablet; Take 1 tablet (500 mg total) by mouth daily with breakfast.  Dispense: 30 tablet; Refill: 3 -A1C  5. Hypercholesteremia - lovastatin (MEVACOR) 20 MG tablet; Take 1 tablet (20 mg total) by mouth daily with supper.  Dispense: 30 tablet; Refill: 3 -lipids  6. Nasal congestion Likely allergic. Sinus wash daily &  flonase bid   See patient instructions for complete plan. F/u 4 wks

## 2014-01-26 ENCOUNTER — Telehealth: Payer: Self-pay | Admitting: Nurse Practitioner

## 2014-01-26 DIAGNOSIS — E039 Hypothyroidism, unspecified: Secondary | ICD-10-CM

## 2014-01-26 MED ORDER — LEVOTHYROXINE SODIUM 88 MCG PO TABS: 88.0000 ug | ORAL_TABLET | Freq: Every day | ORAL | Status: DC

## 2014-01-26 NOTE — Telephone Encounter (Signed)
Spoke with pt, advised message from [facility]. Pt understood. 

## 2014-01-26 NOTE — Telephone Encounter (Signed)
pls call pt: Advise Cholesterol & diabetes labs look better-keep up good work with diet changes-CONTINUE. Thyroid medication needs to be increased. I increased dose-he should pick up new script when he runs out of 75 mcg tabs. Until he runs out, he should take 1 1/2 tabs sat & sun, 1 tab m-f. Pls schedule lab appt in 6 weeks to check TSH. Future order is in.

## 2014-02-19 ENCOUNTER — Encounter (HOSPITAL_COMMUNITY): Payer: Self-pay | Admitting: Cardiology

## 2014-02-25 ENCOUNTER — Telehealth: Payer: Self-pay | Admitting: Nurse Practitioner

## 2014-02-25 NOTE — Telephone Encounter (Signed)
[facility]

## 2014-02-25 NOTE — Telephone Encounter (Signed)
FYI, patient has ov appt 02/27/14.

## 2014-02-27 ENCOUNTER — Ambulatory Visit: Payer: 59 | Admitting: Nurse Practitioner

## 2014-03-08 ENCOUNTER — Ambulatory Visit (HOSPITAL_BASED_OUTPATIENT_CLINIC_OR_DEPARTMENT_OTHER): Payer: 59 | Attending: Pulmonary Disease

## 2014-03-13 ENCOUNTER — Ambulatory Visit (HOSPITAL_BASED_OUTPATIENT_CLINIC_OR_DEPARTMENT_OTHER): Payer: 59 | Attending: Pulmonary Disease

## 2014-03-13 DIAGNOSIS — G4733 Obstructive sleep apnea (adult) (pediatric): Secondary | ICD-10-CM | POA: Insufficient documentation

## 2014-03-23 ENCOUNTER — Telehealth: Payer: Self-pay | Admitting: Pulmonary Disease

## 2014-03-23 DIAGNOSIS — G4733 Obstructive sleep apnea (adult) (pediatric): Secondary | ICD-10-CM

## 2014-03-23 NOTE — Sleep Study (Signed)
[facility]  NAME: [name] DATE OF BIRTH:  04-09-1975 MEDICAL RECORD NUMBER [id number]  LOCATION: [facility]  PHYSICIAN: Coralyn HellingVineet Ziza Hastings, M.D. DATE OF STUDY: 03/13/2014  SLEEP STUDY TYPE: Polysomnogram               REFERRING PHYSICIAN: Coralyn HellingSood, Charleen Madera, MD  INDICATION FOR STUDY:  [name] is a 39 y.o. male who presents to the sleep lab for evaluation of hypersomnia with obstructive sleep apnea.  He reports snoring, sleep disruption, apnea, and daytime sleepiness.  He has history of hypertension and diabetes.  He has remote history of sleep apnea, but previously had trouble tolerating CPAP.  EPWORTH SLEEPINESS SCORE: 14. HEIGHT: 6\' 0"  WEIGHT: 309 lbs BMI: 42  NECK SIZE: 18 in.  MEDICATIONS:  Current Outpatient Prescriptions on File Prior to Visit  Medication Sig Dispense Refill  . aspirin 81 MG chewable tablet Chew 81 mg by mouth daily.    . Cholecalciferol (VITAMIN D PO) Take 1 tablet by mouth daily.    . citalopram (CELEXA) 40 MG tablet Take 1 tablet (40 mg total) by mouth at bedtime. 30 tablet 2  . cloNIDine (CATAPRES) 0.1 MG tablet Take 1 tablet (0.1 mg total) by mouth at bedtime. 30 tablet 3  . fluticasone (FLONASE) 50 MCG/ACT nasal spray Place 1 spray into both nostrils 2 (two) times daily. 16 g 6  . ibuprofen (ADVIL,MOTRIN) 200 MG tablet Take 200 mg by mouth every 6 (six) hours as needed.    Marland Kitchen. levothyroxine (SYNTHROID, LEVOTHROID) 88 MCG tablet Take 1 tablet (88 mcg total) by mouth daily. 30 tablet 1  . lisinopril-hydrochlorothiazide (PRINZIDE,ZESTORETIC) 20-25 MG per tablet Take 2 tablets by mouth daily. 60 tablet 3  . LORazepam (ATIVAN) 1 MG tablet Take 1 tablet (1 mg total) by mouth daily as needed for anxiety. 20 tablet 1  . lovastatin (MEVACOR) 20 MG tablet Take 1 tablet (20 mg total) by mouth daily with supper. 30 tablet 3  . metFORMIN (GLUCOPHAGE XR) 500 MG 24 hr tablet Take 1 tablet (500 mg total) by mouth daily with  breakfast. 30 tablet 3  . sodium chloride (OCEAN) 0.65 % nasal spray Place 1 spray into the nose as needed. Nasal dryness     No current facility-administered medications on file prior to visit.    SLEEP ARCHITECTURE:  Total recording time: 416.5 minutes.  Total sleep time was: 355.5 minutes.  Sleep efficiency: 85.4%.  Sleep latency: 8 minutes.  REM latency: 84.5 minutes.  Stage N1: 3.5%.  Stage N2: 70.7%.  Stage N3: 0%.  Stage R:  25.7%.  Supine sleep: 157 minutes.  Non-supine sleep: 198.5 minutes.  CARDIAC DATA:  Average heart rate: 70 beats per minute. Rhythm strip: sinus rhythm with PVCs.  RESPIRATORY DATA: Average respiratory rate: 18. Snoring: loud. Average AHI: 9.5.   Apnea index: 1.5.  Hypopnea index: 7.9. Obstructive apnea index: 1.5.  Central apnea index: 0.  Mixed apnea index: 0. REM AHI: 22.3.  NREM AHI: 5. Supine AHI: 6.9. Non-supine AHI: 9.1.  MOVEMENT/PARASOMNIA:  Periodic limb movement: 3.5.  Period limb movements with arousals: 0. Restroom trips: 1.  OXYGEN DATA:  Baseline oxygenation: 96%. Lowest SaO2: 88%. Time spent below SaO2 90%: 4.3 minutes. Supplemental oxygen used: none.  IMPRESSION/ RECOMMENDATION:   This study shows mild obstructive sleep apnea with an AHI of 9.5 and SaO2 low of 88%.  He had a significant REM affect to his sleep apnea.   Additional therapies include  weight loss, CPAP, oral appliance, or surgical evaluation.  Coralyn Helling, M.D. Diplomate, Biomedical engineer of Sleep Medicine  ELECTRONICALLY SIGNED ON:  03/23/2014, 5:44 PM [facility] PH: [phone]   FX: [phone] ACCREDITED BY THE [employer]ICAN ACAD[employer] OF SLEEP MEDICINE

## 2014-03-23 NOTE — Telephone Encounter (Signed)
PSG 03/13/14 >> AHI 9.5, SaO2 low 88%  Will have my nurse inform pt that sleep study shows mild sleep apnea.  He needs ROV to discuss tx options in more detail.  Okay to schedule with Tammy Parrett if I do not have ROV soon.

## 2014-03-24 NOTE — Telephone Encounter (Signed)
ATC, could not leave message.  03/24/14

## 2014-04-02 NOTE — Telephone Encounter (Signed)
Spoke with pt daughter - pt is not home. Can call him on cell # [phone] Called and spoke with patient, aware of results. Pt states that he needs late in afternoon appt - unable to do mornings with his new job. Pt scheduled for OV with TP 04/14/14 at 4:30pm.  Nothing further needed.

## 2014-04-14 ENCOUNTER — Ambulatory Visit: Payer: 59 | Admitting: Adult Health

## 2014-04-24 ENCOUNTER — Ambulatory Visit: Payer: 59 | Admitting: Nurse Practitioner

## 2014-06-16 ENCOUNTER — Encounter: Payer: Self-pay | Admitting: Nurse Practitioner

## 2014-06-16 ENCOUNTER — Ambulatory Visit (INDEPENDENT_AMBULATORY_CARE_PROVIDER_SITE_OTHER): Payer: 59 | Admitting: Nurse Practitioner

## 2014-06-16 VITALS — BP 150/100 | HR 78 | Temp 97.8°F | Ht 72.0 in | Wt 315.0 lb

## 2014-06-16 DIAGNOSIS — E039 Hypothyroidism, unspecified: Secondary | ICD-10-CM | POA: Diagnosis not present

## 2014-06-16 DIAGNOSIS — I10 Essential (primary) hypertension: Secondary | ICD-10-CM

## 2014-06-16 DIAGNOSIS — E119 Type 2 diabetes mellitus without complications: Secondary | ICD-10-CM | POA: Diagnosis not present

## 2014-06-16 DIAGNOSIS — Z23 Encounter for immunization: Secondary | ICD-10-CM

## 2014-06-16 DIAGNOSIS — Z114 Encounter for screening for human immunodeficiency virus [HIV]: Secondary | ICD-10-CM

## 2014-06-16 DIAGNOSIS — L03012 Cellulitis of left finger: Secondary | ICD-10-CM

## 2014-06-16 LAB — LIPID PANEL
CHOL/HDL RATIO: 6
Cholesterol: 277 mg/dL — ABNORMAL HIGH (ref 0–200)
HDL: 45.1 mg/dL (ref >39.00)
LDL Cholesterol: 214 mg/dL — ABNORMAL HIGH (ref 0–99)
NonHDL: 231.9
TRIGLYCERIDES: 88 mg/dL (ref 0.0–149.0)
VLDL: 17.6 mg/dL (ref 0.0–40.0)

## 2014-06-16 LAB — COMPREHENSIVE METABOLIC PANEL
ALBUMIN: 4.1 g/dL (ref 3.5–5.2)
ALT: 32 U/L (ref 0–53)
AST: 20 U/L (ref 0–37)
Alkaline Phosphatase: 53 U/L (ref 39–117)
BUN: 16 mg/dL (ref 6–23)
CHLORIDE: 101 meq/L (ref 96–112)
CO2: 30 mEq/L (ref 19–32)
Calcium: 9.8 mg/dL (ref 8.4–10.5)
Creatinine, Ser: 0.98 mg/dL (ref 0.40–1.50)
GFR: 110.96 mL/min (ref >60.00)
Glucose, Bld: 104 mg/dL — ABNORMAL HIGH (ref 70–99)
POTASSIUM: 4.2 meq/L (ref 3.5–5.1)
SODIUM: 136 meq/L (ref 135–145)
Total Bilirubin: 0.4 mg/dL (ref 0.2–1.2)
Total Protein: 7.5 g/dL (ref 6.0–8.3)

## 2014-06-16 LAB — TSH: TSH: 1.22 u[IU]/mL (ref 0.35–4.50)

## 2014-06-16 LAB — VITAMIN B12: VITAMIN B 12: 312 pg/mL (ref 211–911)

## 2014-06-16 LAB — HEMOGLOBIN A1C: Hgb A1c MFr Bld: 7.1 % — ABNORMAL HIGH (ref 4.6–6.5)

## 2014-06-16 MED ORDER — CLONIDINE HCL 0.1 MG PO TABS: 0.1000 mg | ORAL_TABLET | Freq: Every day | ORAL | Status: DC

## 2014-06-16 MED ORDER — LISINOPRIL-HYDROCHLOROTHIAZIDE 20-25 MG PO TABS: 2.0000 | ORAL_TABLET | Freq: Every day | ORAL | Status: DC

## 2014-06-16 MED ORDER — SULFAMETHOXAZOLE-TRIMETHOPRIM 800-160 MG PO TABS: 1.0000 | ORAL_TABLET | Freq: Two times a day (BID) | ORAL | Status: DC

## 2014-06-16 NOTE — Patient Instructions (Signed)
Take thyroid medicine in middle of night if you wake up or take 1 hour before coffee or breakfast. Take celexa & lisinopril in morning. Take lisinopril, clonidine, & lovastatin at night.  YOU NEED TO LOSE 100 POUNDS. This will take about 1 year if you lose 2 pounds a week.  You MUST be active for 30 minutes every day. Cut calories by drinking water only & no fried foods or sweet foods. Sweet food should be fresh fruit only. Do not drink gatorade or fruit juice.  My office will call with lab results. I will reorder metformin, thyroid medicine & cholesterol medicine once I get results back.  Start antibiotic for finger. Also soak finger in hot water, hydrogen peroxide & mouth wash several times daily. Use equal amounts of each. Put vaseline on finger & keep covered with band aid.  Return in 10 days to check finger.

## 2014-06-16 NOTE — Progress Notes (Signed)
Subjective:     [name] is a 39 y.o. male presents w/painful swollen finger. We also addressed weight gain & impact on HTN & DM. Thyroid also discussed. 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 Working OT-about 6 days/week. 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 painful swollen finger: onset 4 days. No injury. L middle finger. Tender to touch, Pain kept awake last night. Explained relationship of frequent infections & uncontrolled diabtetes. weight gain: Gained 113 lbs in 5 mos. Morbidly obese. Not exercising or making healthy food choices. Wants to lose weight, hopes he will get off meds. Discussed health risks if weight continues. Discussed goal of losing 100 lbs & time frame for weight loss. Discussed importance of activity & diet changes. HTN: not at goal. Not taking meds daily. Discussed risk for stroke, small vessel damage. Discussed importance of regular use of meds, limiting salt, wt loss, exercise. DM: not taking metformin daily. Not using diet to control BS. Discussed risk of kidney damage w/ combination of htn & DM. Stressed importance of diet & exercise changes. Needs pna vaccine. Thyroid: Not taking med consistently. Explained relationship of met rate, wt gain, HR, energy, sleep w/thyroid function.   The following portions of the patient's history were reviewed and updated as appropriate: allergies, current medications, past medical history, past social history, past surgical history and problem list.  Review of Systems Eyes: negative for visual disturbance and wears glasses. had recent eye exam Respiratory: negative for cough, sputum and wheezing Cardiovascular: negative for chest pressure/discomfort, lower extremity edema and palpitations Gastrointestinal: positive for dyspepsia    Objective:    BP 150/100 mmHg  Pulse 78  Temp(Src) 97.8 F (36.6 C) (Oral)  Ht 6' (1.829 m)  Wt 315 lb (142.883 kg)  BMI 42.71 kg/m2  SpO2 98% BP 150/100  mmHg  Pulse 78  Temp(Src) 97.8 F (36.6 C) (Oral)  Ht 6' (1.829 m)  Wt 315 lb (142.883 kg)  BMI 42.71 kg/m2  SpO2 98% General appearance: alert, cooperative, appears stated age and no distress Head: Normocephalic, without obvious abnormality, atraumatic Eyes: negative findings: lids and lashes normal, conjunctivae and sclerae normal and wearing glasses Neck: no carotid bruit, supple, symmetrical, trachea midline and thyroid not enlarged, symmetric, no tenderness/mass/nodules Lungs: clear to auscultation bilaterally Heart: regular rate and rhythm, S1, S2 normal, no murmur, click, rub or gallop Skin: slightly erythematous & swollen medial egde of middle L finger. Tender to touch, unable to express drainage. Neurologic: Grossly normal    Assessment:Plan   1. Essential hypertension, benign MUst lose 100 lbs. Change diet Exercise Take meds regularly. - Comprehensive metabolic panel - Lipid panel - lisinopril-hydrochlorothiazide (PRINZIDE,ZESTORETIC) 20-25 MG per tablet; Take 2 tablets by mouth daily.  Dispense: 60 tablet; Refill: 3 - cloNIDine (CATAPRES) 0.1 MG tablet; Take 1 tablet (0.1 mg total) by mouth at bedtime.  Dispense: 30 tablet; Refill: 3  2. Hypothyroidism, unspecified hypothyroidism type Needs to take med consistently Gave written instructions for med schedule. - TSH  3. Type 2 diabetes mellitus without complication May need to increase metformin-monitor results MUST make diet changes & exercise-see instructions - Hemoglobin A1c - Comprehensive metabolic panel - Lipid panel - Vitamin B12  4. Screening for HIV (human immunodeficiency virus) - HIV antibody  5. Need for 23-polyvalent pneumococcal polysaccharide vaccine - Pneumococcal polysaccharide vaccine 23-valent greater than or equal to 2yo subcutaneous/IM  6. Paronychia, left Soaks-see instructions - sulfamethoxazole-trimethoprim (BACTRIM DS,SEPTRA DS) 800-160 MG per tablet; Take 1 tablet by mouth 2 (  two)  times daily.  Dispense: 20 tablet; Refill: 0 F/u 10 days Spent 25 Minutes with patient, 50% or more was spent in counseling.

## 2014-06-16 NOTE — Progress Notes (Signed)
Pre visit review using our clinic review tool, if applicable. No additional management support is needed unless otherwise documented below in the visit note. 

## 2014-06-17 ENCOUNTER — Telehealth: Payer: Self-pay | Admitting: Nurse Practitioner

## 2014-06-17 DIAGNOSIS — E039 Hypothyroidism, unspecified: Secondary | ICD-10-CM

## 2014-06-17 DIAGNOSIS — E7800 Pure hypercholesterolemia, unspecified: Secondary | ICD-10-CM

## 2014-06-17 DIAGNOSIS — E119 Type 2 diabetes mellitus without complications: Secondary | ICD-10-CM

## 2014-06-17 LAB — HIV ANTIBODY (ROUTINE TESTING W REFLEX): HIV: NONREACTIVE

## 2014-06-17 MED ORDER — LEVOTHYROXINE SODIUM 88 MCG PO TABS: 88.0000 ug | ORAL_TABLET | Freq: Every day | ORAL | Status: DC

## 2014-06-17 MED ORDER — LOVASTATIN 40 MG PO TABS: 40.0000 mg | ORAL_TABLET | Freq: Every day | ORAL | Status: DC

## 2014-06-17 MED ORDER — METFORMIN HCL ER (OSM) 1000 MG PO TB24: 1000.0000 mg | ORAL_TABLET | Freq: Every day | ORAL | Status: DC

## 2014-06-17 NOTE — Telephone Encounter (Signed)
pls call pt: Advise A1C is now 7.1. I have doubled metformin dose. He should take 1000 mg daily. New script sent. Bad choelsterol has doubled. I have doubled his mevachor. He will take 40 mg daily. Sent new script. Thyroid looks good. No changes to med. Refilled script.  He must work on diet changes & exercise. Keep f/u appointment.

## 2014-06-17 NOTE — Telephone Encounter (Signed)
Called and unable to LMOVM.

## 2014-06-18 NOTE — Telephone Encounter (Signed)
Called and informed patient of lab results.  

## 2014-06-30 ENCOUNTER — Ambulatory Visit: Payer: 59 | Admitting: Nurse Practitioner

## 2014-09-07 ENCOUNTER — Other Ambulatory Visit: Payer: Self-pay

## 2014-09-15 ENCOUNTER — Ambulatory Visit (INDEPENDENT_AMBULATORY_CARE_PROVIDER_SITE_OTHER): Payer: 59 | Admitting: Nurse Practitioner

## 2014-09-15 ENCOUNTER — Encounter: Payer: Self-pay | Admitting: Nurse Practitioner

## 2014-09-15 VITALS — BP 140/80 | HR 84 | Temp 97.8°F | Resp 16 | Ht 72.0 in | Wt 311.0 lb

## 2014-09-15 DIAGNOSIS — E119 Type 2 diabetes mellitus without complications: Secondary | ICD-10-CM

## 2014-09-15 DIAGNOSIS — E039 Hypothyroidism, unspecified: Secondary | ICD-10-CM

## 2014-09-15 DIAGNOSIS — E559 Vitamin D deficiency, unspecified: Secondary | ICD-10-CM

## 2014-09-15 DIAGNOSIS — E785 Hyperlipidemia, unspecified: Secondary | ICD-10-CM

## 2014-09-15 DIAGNOSIS — E538 Deficiency of other specified B group vitamins: Secondary | ICD-10-CM | POA: Diagnosis not present

## 2014-09-15 DIAGNOSIS — I10 Essential (primary) hypertension: Secondary | ICD-10-CM

## 2014-09-15 MED ORDER — LISINOPRIL 40 MG PO TABS: 40.0000 mg | ORAL_TABLET | Freq: Every day | ORAL | Status: DC

## 2014-09-15 MED ORDER — HYDROCHLOROTHIAZIDE 12.5 MG PO CAPS: 12.5000 mg | ORAL_CAPSULE | Freq: Every day | ORAL | Status: DC

## 2014-09-15 MED ORDER — AMLODIPINE BESYLATE 5 MG PO TABS: 5.0000 mg | ORAL_TABLET | Freq: Every day | ORAL | Status: DC

## 2014-09-15 NOTE — Progress Notes (Signed)
Pre visit review using our clinic review tool, if applicable. No additional management support is needed unless otherwise documented below in the visit note. 

## 2014-09-15 NOTE — Progress Notes (Signed)
Subjective:     [name] is a 39 y.o. male returns for f/u of hypothyroidism, DM2, HTN, and is c/o fatigue-noticeably worse since metformin dose was increased. He also has Hx of B12 & vit D deficiency. Hypothyroidism: taking levothyroxine daily, lost 4 lbs in 3 mos-says he is drinking more water. Denies heart palpitations, temp intolerance. DM2: increased metformin to 1000 mg from 500 mg qd 3 mmos ago due to A1C increased to 7.1 from 6.5. He thinks he feels more tired since dose increased. No other intolerable SE. B12 has been low-possibly related to metformin. HTN: fair control. RF: male, race, sedentary, obese, DM, hyperlipideima, mild sleep apnea. No end organ damage. He is taking lisinopril regularly, but takes clonidine 2-3 times week-often forgets it. Discussed to continue to work on weight loss-increase exercise, no fried foods, cut out sugar. He denies fever. I recommend he schedule f/u w/pulomology to discuss treatment for sleep apnea.  The following portions of the patient's history were reviewed and updated as appropriate: allergies, current medications, past medical history, past social history, past surgical history and problem list.  Review of Systems Pertinent items are noted in HPI.    Objective:    BP 140/80 mmHg  Pulse 84  Temp(Src) 97.8 F (36.6 C) (Oral)  Resp 16  Ht 6' (1.829 m)  Wt 311 lb (141.069 kg)  BMI 42.17 kg/m2  SpO2 99% BP 140/80 mmHg  Pulse 84  Temp(Src) 97.8 F (36.6 C) (Oral)  Resp 16  Ht 6' (1.829 m)  Wt 311 lb (141.069 kg)  BMI 42.17 kg/m2  SpO2 99% General appearance: alert, cooperative, appears stated age and no distress Eyes: negative findings: lids and lashes normal and conjunctivae and sclerae normal Lungs: clear to auscultation bilaterally Heart: regular rate and rhythm, S1, S2 normal, no murmur, click, rub or gallop Extremities: extremities normal, atraumatic, no cyanosis or edema Neurologic: Grossly normal    Assessment: Plan      1. Type 2 diabetes mellitus without complication Cont metformin, no changes until labs result - Lipid panel - Hemoglobin A1c - Comprehensive metabolic panel  2. Hypothyroidism, unspecified hypothyroidism type Cont levothyroxine, changes PRN labs - TSH  3. Hyperlipidemia Continue mevacor changes PRN labs  4. Essential hypertension, benign Lifestyle changes Med changes: - lisinopril (PRINIVIL,ZESTRIL) 40 MG tablet; Take 1 tablet (40 mg total) by mouth daily.  Dispense: 30 tablet; Refill: 0 - hydrochlorothiazide (MICROZIDE) 12.5 MG capsule; Take 1 capsule (12.5 mg total) by mouth daily.  Dispense: 30 capsule; Refill: 0 start - amLODipine (NORVASC) 5 MG tablet; Take 1 tablet (5 mg total) by mouth daily.  Dispense: 30 tablet; Refill: 0 D/c clonidine as it may be contributing to fatigue Monitor bp at home for 3 weeks, call if over 140/90.  5. Vitamin B12 deficiency (non anemic) - Vitamin B12  6. Vitamin D deficiency - Vit D  25 hydroxy (rtn osteoporosis monitoring)  F/u 393mos-DM, thyroid disease, HTN, hyperlipidemia

## 2014-09-15 NOTE — Patient Instructions (Addendum)
Start taking new blood pressure medicines as follows: Lisinopril 40 mg and hydrochlorothiazide 12.5 in mornings.  Take norvasc 5 mg at night. This will replace clonidine.  Continue all other meds. I will refill thyroid medicine & metformin after I get your labs back.  My office will call with lab results.  Check blood pressure at home for 3 weeks. Call for appointment if getting readings over 140/90.   Please reschedule appointment with pulmonology for treatment of sleep apnea.

## 2014-09-16 ENCOUNTER — Telehealth: Payer: Self-pay | Admitting: Nurse Practitioner

## 2014-09-16 DIAGNOSIS — E039 Hypothyroidism, unspecified: Secondary | ICD-10-CM

## 2014-09-16 DIAGNOSIS — E7800 Pure hypercholesterolemia, unspecified: Secondary | ICD-10-CM

## 2014-09-16 DIAGNOSIS — E559 Vitamin D deficiency, unspecified: Secondary | ICD-10-CM

## 2014-09-16 LAB — TSH: TSH: 1.31 u[IU]/mL (ref 0.35–4.50)

## 2014-09-16 LAB — COMPREHENSIVE METABOLIC PANEL
ALT: 25 U/L (ref 0–53)
AST: 17 U/L (ref 0–37)
Albumin: 4.1 g/dL (ref 3.5–5.2)
Alkaline Phosphatase: 53 U/L (ref 39–117)
BILIRUBIN TOTAL: 0.3 mg/dL (ref 0.2–1.2)
BUN: 16 mg/dL (ref 6–23)
CHLORIDE: 99 meq/L (ref 96–112)
CO2: 29 meq/L (ref 19–32)
CREATININE: 1.07 mg/dL (ref 0.40–1.50)
Calcium: 9.7 mg/dL (ref 8.4–10.5)
GFR: 100.12 mL/min (ref >60.00)
Glucose, Bld: 81 mg/dL (ref 70–99)
Potassium: 4.1 mEq/L (ref 3.5–5.1)
Sodium: 139 mEq/L (ref 135–145)
Total Protein: 7.6 g/dL (ref 6.0–8.3)

## 2014-09-16 LAB — LIPID PANEL
CHOLESTEROL: 271 mg/dL — AB (ref 0–200)
HDL: 41.3 mg/dL (ref >39.00)
LDL Cholesterol: 207 mg/dL — ABNORMAL HIGH (ref 0–99)
NONHDL: 229.7
TRIGLYCERIDES: 113 mg/dL (ref 0.0–149.0)
Total CHOL/HDL Ratio: 7
VLDL: 22.6 mg/dL (ref 0.0–40.0)

## 2014-09-16 LAB — HEMOGLOBIN A1C: Hgb A1c MFr Bld: 6.6 % — ABNORMAL HIGH (ref 4.6–6.5)

## 2014-09-16 LAB — VITAMIN D 25 HYDROXY (VIT D DEFICIENCY, FRACTURES): VITD: 17.97 ng/mL — ABNORMAL LOW (ref 30.00–100.00)

## 2014-09-16 LAB — VITAMIN B12: Vitamin B-12: 415 pg/mL (ref 211–911)

## 2014-09-16 MED ORDER — LEVOTHYROXINE SODIUM 88 MCG PO TABS: 88.0000 ug | ORAL_TABLET | Freq: Every day | ORAL | Status: DC

## 2014-09-16 MED ORDER — VITAMIN D3 1.25 MG (50000 UT) PO CAPS: 1.0000 | ORAL_CAPSULE | ORAL | Status: DC

## 2014-09-16 NOTE — Telephone Encounter (Signed)
pls call pt: Advise  A1c is much improved! 6.6. He may not feel as tired if he takes  twice daily rather than 1000 mg XR tab. Let me know if he wants to switch. I'm not sure if it will help, but we can try. Thyroid at goal, so no changes. B12 at goal, but needs to keep taking B complex daily. Vit D is to low: start prescription strength D3. Take 1 capsule daily for 12 weeks. Level will be checked again in 3 mos. Cholesterol is not improved. Take med daily.

## 2014-09-17 NOTE — Telephone Encounter (Signed)
lmom for pt to CB °

## 2014-09-17 NOTE — Telephone Encounter (Signed)
Patient aware of results.  He would like to try the metformin  BID.  Please send to pharmacy.

## 2014-09-18 ENCOUNTER — Other Ambulatory Visit: Payer: Self-pay | Admitting: Nurse Practitioner

## 2014-09-18 ENCOUNTER — Ambulatory Visit (INDEPENDENT_AMBULATORY_CARE_PROVIDER_SITE_OTHER): Payer: 59 | Admitting: Adult Health

## 2014-09-18 ENCOUNTER — Encounter: Payer: Self-pay | Admitting: Adult Health

## 2014-09-18 VITALS — BP 112/62 | HR 77 | Temp 99.4°F | Ht 72.0 in | Wt 311.0 lb

## 2014-09-18 DIAGNOSIS — G4733 Obstructive sleep apnea (adult) (pediatric): Secondary | ICD-10-CM | POA: Diagnosis not present

## 2014-09-18 DIAGNOSIS — E119 Type 2 diabetes mellitus without complications: Secondary | ICD-10-CM

## 2014-09-18 MED ORDER — METFORMIN HCL 500 MG PO TABS: 500.0000 mg | ORAL_TABLET | Freq: Two times a day (BID) | ORAL | Status: DC

## 2014-09-18 NOTE — Progress Notes (Signed)
   Subjective:    Patient ID: [name], male    DOB: 15-Apr-1975, 39 y.o.   MRN: [id number]  HPI 39 yo morbidly obese male with OSA   TEST  PSG 03/13/14 >> AHI 9.5, SaO2 low 88%  09/18/2014 Follow up OSA  Pt returns to discuss his sleep apnea.  Seen last year for sleep consult .  Underwent sleep study in January 2016 that showed   >> AHI 9.5, SaO2 low 88% We discussed sleep apnea and treatment options along with wt loss.  He would like to try CPAP .  Denies chest pain, orthopnea increased edema .   Review of Systems Constitutional:   No  weight loss, night sweats,  Fevers, chills, fatigue, or  lassitude.  HEENT:   No headaches,  Difficulty swallowing,  Tooth/dental problems, or  Sore throat,                No sneezing, itching, ear ache, nasal congestion, post nasal drip,   CV:  No chest pain,  Orthopnea, PND, swelling in lower extremities, anasarca, dizziness, palpitations, syncope.   GI  No heartburn, indigestion, abdominal pain, nausea, vomiting, diarrhea, change in bowel habits, loss of appetite, bloody stools.   Resp: No shortness of breath with exertion or at rest.  No excess mucus, no productive cough,  No non-productive cough,  No coughing up of blood.  No change in color of mucus.  No wheezing.  No chest wall deformity  Skin: no rash or lesions.  GU: no dysuria, change in color of urine, no urgency or frequency.  No flank pain, no hematuria   MS:  No joint pain or swelling.  No decreased range of motion.  No back pain.  Psych:  No change in mood or affect. No depression or anxiety.  No memory loss.         Objective:   Physical Exam  GEN: A/Ox3; pleasant , NAD, obese   HEENT:  [place]/AT,  EACs-clear, TMs-wnl, NOSE-clear, THROAT-clear, no lesions, no postnasal drip or exudate noted. Class 2-3 airway   NECK:  Supple w/ fair ROM; no JVD; normal carotid impulses w/o bruits; no thyromegaly or nodules palpated; no lymphadenopathy.  RESP  Clear  P & A; w/o,  wheezes/ rales/ or rhonchi.no accessory muscle use, no dullness to percussion  CARD:  RRR, no m/r/g  , no peripheral edema, pulses intact, no cyanosis or clubbing.  GI:   Soft & nt; nml bowel sounds; no organomegaly or masses detected.  Musco: Warm bil, no deformities or joint swelling noted.   Neuro: alert, no focal deficits noted.    Skin: Warm, no lesions or rashes        Assessment & Plan:

## 2014-09-18 NOTE — Patient Instructions (Signed)
Begin CPAP At bedtime   Download in 1 month  Work on weight loss Do not drive if sleepy.  follow up Dr. Craige CottaSood  In 2 months with download .

## 2014-09-24 NOTE — Assessment & Plan Note (Signed)
OSA  Agrees to CPAP start  Education on OSA , wt loss and not driving if sleepy.   Plan  Begin CPAP At bedtime   Download in 1 month  Work on weight loss Do not drive if sleepy.  follow up Dr. Craige CottaSood  In 2 months with download .

## 2014-10-06 ENCOUNTER — Ambulatory Visit: Payer: 59 | Admitting: Nurse Practitioner

## 2014-10-09 ENCOUNTER — Other Ambulatory Visit: Payer: Self-pay | Admitting: Nurse Practitioner

## 2014-10-09 DIAGNOSIS — I10 Essential (primary) hypertension: Secondary | ICD-10-CM

## 2014-10-09 DIAGNOSIS — E7800 Pure hypercholesterolemia, unspecified: Secondary | ICD-10-CM

## 2014-10-09 MED ORDER — HYDROCHLOROTHIAZIDE 12.5 MG PO CAPS: 12.5000 mg | ORAL_CAPSULE | Freq: Every day | ORAL | Status: DC

## 2014-10-09 MED ORDER — AMLODIPINE BESYLATE 5 MG PO TABS: 5.0000 mg | ORAL_TABLET | Freq: Every day | ORAL | Status: DC

## 2014-10-09 MED ORDER — LOVASTATIN 40 MG PO TABS: 40.0000 mg | ORAL_TABLET | Freq: Every day | ORAL | Status: DC

## 2014-10-09 MED ORDER — LISINOPRIL 40 MG PO TABS: 40.0000 mg | ORAL_TABLET | Freq: Every day | ORAL | Status: DC

## 2014-10-26 ENCOUNTER — Other Ambulatory Visit: Payer: Self-pay | Admitting: Family Medicine

## 2014-10-26 NOTE — Telephone Encounter (Signed)
RF request for citalopram.  Pt is no longer on this medication.

## 2014-10-29 ENCOUNTER — Telehealth: Payer: Self-pay | Admitting: Family Medicine

## 2014-10-29 MED ORDER — CITALOPRAM HYDROBROMIDE 40 MG PO TABS: 40.0000 mg | ORAL_TABLET | Freq: Every day | ORAL | Status: DC

## 2014-10-29 NOTE — Telephone Encounter (Signed)
Patient [facility] stating that he needs refills for citalopram and lisinopril.  These meds are not in current medication list.  Can you please advise.

## 2014-10-29 NOTE — Telephone Encounter (Signed)
Patient was unaware this med was D/C'd.  He states he has been taking for years and took his last dose yesterday.  Pt can't come into office until next Thursday.  Per Dr. Milinda Cave, 30 days of citalopram sent into pharmacy.

## 2014-10-29 NOTE — Telephone Encounter (Signed)
Pt has lisinopril on his medication list and a new script with 4 refills was given by Layne at the end of July. The celexa is not on his current medication list (last given 01/2014 with 2 refills) and if he needs refills he will need to be seen. Thanks.

## 2014-11-05 ENCOUNTER — Ambulatory Visit: Payer: 59 | Admitting: Family Medicine

## 2014-12-07 ENCOUNTER — Telehealth: Payer: Self-pay | Admitting: Adult Health

## 2014-12-07 NOTE — Telephone Encounter (Signed)
CPAP download results received and review by TP: Poor compliance/usage; try to use every night for at least 4-6hours.  OV with VS in 1-2 months.  Download sent for scan.  LMOM TCB x1.

## 2014-12-09 NOTE — Telephone Encounter (Signed)
lmomcb x2 for pt 

## 2014-12-10 NOTE — Telephone Encounter (Signed)
LVM for pt to return call

## 2014-12-10 NOTE — Telephone Encounter (Signed)
Spoke with pt and notified of results per Tammy Parrett, NP. Pt verbalized understanding and denied any questions. 

## 2014-12-21 ENCOUNTER — Telehealth: Payer: Self-pay | Admitting: Family Medicine

## 2014-12-21 ENCOUNTER — Ambulatory Visit (INDEPENDENT_AMBULATORY_CARE_PROVIDER_SITE_OTHER): Payer: 59 | Admitting: Family

## 2014-12-21 ENCOUNTER — Encounter: Payer: Self-pay | Admitting: Family

## 2014-12-21 VITALS — BP 150/110 | HR 86 | Temp 98.8°F | Resp 16 | Ht 72.0 in | Wt 323.0 lb

## 2014-12-21 DIAGNOSIS — Z23 Encounter for immunization: Secondary | ICD-10-CM | POA: Diagnosis not present

## 2014-12-21 DIAGNOSIS — R0981 Nasal congestion: Secondary | ICD-10-CM | POA: Diagnosis not present

## 2014-12-21 DIAGNOSIS — E785 Hyperlipidemia, unspecified: Secondary | ICD-10-CM

## 2014-12-21 DIAGNOSIS — E1310 Other specified diabetes mellitus with ketoacidosis without coma: Secondary | ICD-10-CM

## 2014-12-21 DIAGNOSIS — I10 Essential (primary) hypertension: Secondary | ICD-10-CM

## 2014-12-21 DIAGNOSIS — E039 Hypothyroidism, unspecified: Secondary | ICD-10-CM

## 2014-12-21 DIAGNOSIS — E1110 Type 2 diabetes mellitus with ketoacidosis without coma: Secondary | ICD-10-CM

## 2014-12-21 MED ORDER — LISINOPRIL 20 MG PO TABS
40.0000 mg | ORAL_TABLET | Freq: Every day | ORAL | Status: DC
Start: 2014-12-21 — End: 2015-05-14

## 2014-12-21 MED ORDER — AMLODIPINE BESYLATE 5 MG PO TABS: 5.0000 mg | ORAL_TABLET | Freq: Every day | ORAL | Status: DC

## 2014-12-21 NOTE — Telephone Encounter (Signed)
Patient states that he can't afford lisinopril , however the lisinopril  is on the $4 list at [facility].  He is wondering if you could Rx lisinopril 20 mg BID instead.  Pt states he does plan on scheduling appt but his work schedule conflicts with ours.     I also gave patient [place] phone number to contact them since they see patient's late.  He may just need to establish there since he can't schedule until after 4:15.

## 2014-12-21 NOTE — Patient Instructions (Addendum)
Work Towards 30 minutes 5 days a week of week of walking. Stop sweet tea, soda, fried food. Add more fresh fruits and veggies. Watch portions. Set up a plan using my fitness pal app with goal weight loss of 1-2 pounds/per week.   Please schedule a lab draw at the front desk.

## 2014-12-21 NOTE — Progress Notes (Signed)
Subjective:    Patient ID: [name], male    DOB: 14-Oct-1975, 39 y.o.   MRN: [id number]  HPI   [name] is a 39 yr old male who presents today for follow up.  HTN- patient reports that he is out of his BP meds.  BP Readings from Last 3 Encounters:  12/21/14 150/110  09/18/14 112/62  09/15/14 140/80   Hypothyroid- reports that his energy is fair.  Wt Readings from Last 3 Encounters:  12/21/14 323 lb (146.512 kg)  09/18/14 311 lb (141.069 kg)  09/15/14 311 lb (141.069 kg)   OSA- uses CPAP- reports that he has sinus congestion/head ears stuffy. Thinks he may have had a subjective temp.    Hyperlipidemia- not taking lovastatin regularly Lab Results  Component Value Date   CHOL 271* 09/15/2014   HDL 41.30 09/15/2014   LDLCALC 207* 09/15/2014   LDLDIRECT 208.6 03/26/2013   TRIG 113.0 09/15/2014   CHOLHDL 7 09/15/2014   DM2- on metformin,  Lab Results  Component Value Date   HGBA1C 6.6* 09/15/2014   HGBA1C 7.1* 06/16/2014   HGBA1C 6.5 01/23/2014   Lab Results  Component Value Date   MICROALBUR 0.3 10/17/2013   LDLCALC 207* 09/15/2014   CREATININE 1.07 09/15/2014    Typical 24 hour diet:  Breakfast- 2 eggs, 2 piece bacon, biscuit, water,  Lunch- salads ([employer]) sweet tea Dinner- eats out a lot (last night steak and cheese sub and some fries) Bowel of cereal.    Not exercising regularly.    Review of Systems See HPI  Past Medical History  Diagnosis Date  . Hypertension   . Depression   . Anxiety   . Hyperlipidemia   . Hypothyroidism   . Sinus congestion   . Obesity   . Diabetes mellitus without complication (HCC)   . OSA (obstructive sleep apnea)     Social History   Social History  . Marital Status: Married    Spouse Name: N/A  . Number of Children: N/A  . Years of Education: N/A   Occupational History  . 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 Smoking status: Never Smoker   . Smokeless tobacco:  Not on file  . Alcohol Use: No  . Drug Use: Not on file  . Sexual Activity: Not on file   Other Topics Concern  . Not on file   Social History Narrative    Past Surgical History  Procedure Laterality Date  . Hernia repair  1990    Right ingruial & umbilical [place]   . Repair right arm fracture  1996    [place]   . Fracture surgery      right arch  . Uvulopalatopharyngoplasty (uppp)/tonsillectomy/septoplasty    . Left heart catheterization with coronary angiogram N/A 09/26/2011    Procedure: LEFT HEART CATHETERIZATION WITH CORONARY ANGIOGRAM;  Surgeon: Pamella Pert, MD;  Location: [facility] CATH LAB;  Service: Cardiovascular;  Laterality: N/A;    Family History  Problem Relation Age of Onset  . Hypertension Mother   . Hypertension Father   . Heart attack Father   . Down syndrome Brother   . Breast cancer Paternal Grandmother   . Kidney disease Maternal Grandfather     Allergies  Allergen Reactions  . Cymbalta [Duloxetine Hcl] Rash    Current Outpatient Prescriptions on File Prior to Visit  Medication Sig Dispense Refill  . aspirin 81 MG chewable tablet Chew 81  mg by mouth daily.    . citalopram (CELEXA) 40 MG tablet Take 1 tablet (40 mg total) by mouth daily. 30 tablet 3  . fluticasone (FLONASE) 50 MCG/ACT nasal spray Place 1 spray into both nostrils 2 (two) times daily. 16 g 6  . levothyroxine (SYNTHROID, LEVOTHROID) 88 MCG tablet Take 1 tablet (88 mcg total) by mouth daily. 30 tablet 5  . lovastatin (MEVACOR) 40 MG tablet Take 1 tablet (40 mg total) by mouth daily with supper. 30 tablet 4  . metFORMIN (GLUCOPHAGE) 500 MG tablet Take 1 tablet (500 mg total) by mouth 2 (two) times daily with a meal. 60 tablet 2  . sodium chloride (OCEAN) 0.65 % nasal spray Place 1 spray into the nose as needed. Nasal dryness    . hydrochlorothiazide (MICROZIDE) 12.5 MG capsule Take 1 capsule (12.5 mg total) by mouth daily. (Patient not taking: Reported on 12/21/2014) 30 capsule 4    No current facility-administered medications on file prior to visit.    BP 150/110 mmHg  Pulse 86  Temp(Src) 98.8 F (37.1 C) (Oral)  Resp 16  Ht 6' (1.829 m)  Wt 323 lb (146.512 kg)  BMI 43.80 kg/m2  SpO2 100%       Objective:   Physical Exam  Constitutional: He is oriented to person, place, and time.  Morbidly obese AA male, NAD  HENT:  Large nasal turbinates   Cardiovascular: Normal rate and regular rhythm.   No murmur heard. Pulmonary/Chest: Effort normal and breath sounds normal. No respiratory distress. He has no wheezes. He has no rales. He exhibits no tenderness.  Neurological: He is alert and oriented to person, place, and time.  Psychiatric: He has a normal mood and affect. His behavior is normal. Judgment and thought content normal.          Assessment & Plan:  Nasal congestion- large nasal turbinates- pt desires referral to ENT will arrange.

## 2014-12-21 NOTE — Progress Notes (Signed)
Pre visit review using our clinic review tool, if applicable. No additional management support is needed unless otherwise documented below in the visit note. 

## 2014-12-21 NOTE — Telephone Encounter (Signed)
Patient is requesting a CB about LIsinopril & Amlodipine Rx

## 2014-12-22 NOTE — Assessment & Plan Note (Addendum)
Uncontrolled. Resume BP meds.   

## 2014-12-22 NOTE — Assessment & Plan Note (Signed)
30 minutes spent counseling pt on obesity, healthy diet, exercise, weight loss.

## 2014-12-22 NOTE — Telephone Encounter (Signed)
Pt was seen yesterday by Sandford Craze.  Pt no longer needs these meds as they were filled yesterday.  Please disregard this message.

## 2014-12-31 ENCOUNTER — Other Ambulatory Visit: Payer: 59

## 2015-01-18 ENCOUNTER — Encounter: Payer: Self-pay | Admitting: Nurse Practitioner

## 2015-01-18 ENCOUNTER — Other Ambulatory Visit (INDEPENDENT_AMBULATORY_CARE_PROVIDER_SITE_OTHER): Payer: 59

## 2015-01-18 DIAGNOSIS — E785 Hyperlipidemia, unspecified: Secondary | ICD-10-CM

## 2015-01-18 DIAGNOSIS — E1310 Other specified diabetes mellitus with ketoacidosis without coma: Secondary | ICD-10-CM | POA: Diagnosis not present

## 2015-01-18 DIAGNOSIS — E1110 Type 2 diabetes mellitus with ketoacidosis without coma: Secondary | ICD-10-CM

## 2015-01-18 DIAGNOSIS — E039 Hypothyroidism, unspecified: Secondary | ICD-10-CM | POA: Diagnosis not present

## 2015-01-18 LAB — BASIC METABOLIC PANEL
BUN: 12 mg/dL (ref 6–23)
CHLORIDE: 101 meq/L (ref 96–112)
CO2: 30 mEq/L (ref 19–32)
Calcium: 9.8 mg/dL (ref 8.4–10.5)
Creatinine, Ser: 1.01 mg/dL (ref 0.40–1.50)
GFR: 106.82 mL/min (ref >60.00)
GLUCOSE: 98 mg/dL (ref 70–99)
POTASSIUM: 4.2 meq/L (ref 3.5–5.1)
Sodium: 138 mEq/L (ref 135–145)

## 2015-01-18 LAB — HEPATIC FUNCTION PANEL
ALBUMIN: 4.1 g/dL (ref 3.5–5.2)
ALT: 22 U/L (ref 0–53)
AST: 16 U/L (ref 0–37)
Alkaline Phosphatase: 48 U/L (ref 39–117)
Bilirubin, Direct: 0.1 mg/dL (ref 0.0–0.3)
Total Bilirubin: 0.4 mg/dL (ref 0.2–1.2)
Total Protein: 7.7 g/dL (ref 6.0–8.3)

## 2015-01-18 LAB — LIPID PANEL
CHOL/HDL RATIO: 7
Cholesterol: 247 mg/dL — ABNORMAL HIGH (ref 0–200)
HDL: 34.6 mg/dL — AB (ref >39.00)
LDL CALC: 195 mg/dL — AB (ref 0–99)
NonHDL: 212.29
TRIGLYCERIDES: 88 mg/dL (ref 0.0–149.0)
VLDL: 17.6 mg/dL (ref 0.0–40.0)

## 2015-01-18 LAB — MICROALBUMIN / CREATININE URINE RATIO
Creatinine,U: 362 mg/dL
Microalb Creat Ratio: 0.3 mg/g (ref 0.0–30.0)
Microalb, Ur: 1.1 mg/dL (ref 0.0–1.9)

## 2015-01-18 LAB — TSH: TSH: 1.99 u[IU]/mL (ref 0.35–4.50)

## 2015-01-18 LAB — HEMOGLOBIN A1C: Hgb A1c MFr Bld: 6.7 % — ABNORMAL HIGH (ref 4.6–6.5)

## 2015-01-20 ENCOUNTER — Telehealth: Payer: Self-pay | Admitting: Family

## 2015-01-20 MED ORDER — ATORVASTATIN CALCIUM 40 MG PO TABS: 40.0000 mg | ORAL_TABLET | Freq: Every day | ORAL | Status: DC

## 2015-01-20 NOTE — Telephone Encounter (Signed)
Notified pt and he voices understanding. 

## 2015-01-20 NOTE — Telephone Encounter (Signed)
Cholesterol is very high.  Stop mevacor, start atovastatin 40mg . Sugar is at goal on current meds- continue same. Thyroid test looks good, continue current dose of synthroid.

## 2015-03-26 ENCOUNTER — Encounter: Payer: Self-pay | Admitting: Family Medicine

## 2015-03-26 ENCOUNTER — Ambulatory Visit (INDEPENDENT_AMBULATORY_CARE_PROVIDER_SITE_OTHER): Payer: Worker's Compensation | Admitting: Family Medicine

## 2015-03-26 VITALS — BP 159/105 | HR 93 | Temp 100.0°F | Ht 72.0 in | Wt 321.6 lb

## 2015-03-26 DIAGNOSIS — L03011 Cellulitis of right finger: Secondary | ICD-10-CM

## 2015-03-26 MED ORDER — SULFAMETHOXAZOLE-TRIMETHOPRIM 800-160 MG PO TABS: 1.0000 | ORAL_TABLET | Freq: Two times a day (BID) | ORAL | Status: DC

## 2015-03-26 NOTE — Progress Notes (Signed)
BP 159/105 mmHg  Pulse 93  Temp(Src) 100 F (37.8 C) (Oral)  Ht 6' (1.829 m)  Wt 321 lb 9.6 oz (145.877 kg)  BMI 43.61 kg/m2   Subjective:    Patient ID: [name], male    DOB: 1975/09/04, 40 y.o.   MRN: [id number]  HPI: [name] is a 40 y.o. male presenting on 03/26/2015 for Workers Comp - swelling in 3rd digit, right hand   HPI Finger swelling and pain Patient is coming in today for a workers comp visit. He works for [employer] and believes he may have injured his finger with a piece of metal on 03/24/2015. His finger started to become inflamed and more painful over yesterday 03/25/2015 and that is what brought him in here today. He did not know that he had a low-grade fever until he came in and his temperature here is 100F. He has been taking some Advil. He denies any drainage from the finger and he tried to stick a needle in it to see if he gets about. He has never had skin infections like this previously.  Relevant past medical, surgical, family and social history reviewed and updated as indicated. Interim medical history since our last visit reviewed. Allergies and medications reviewed and updated.  Review of Systems  Constitutional: Positive for fever. Negative for chills.  HENT: Negative for ear discharge and ear pain.   Eyes: Negative for discharge and visual disturbance.  Respiratory: Negative for shortness of breath and wheezing.   Cardiovascular: Negative for chest pain and leg swelling.  Gastrointestinal: Negative for abdominal pain, diarrhea and constipation.  Genitourinary: Negative for difficulty urinating.  Musculoskeletal: Negative for back pain and gait problem.  Skin: Positive for color change, rash and wound.  Neurological: Negative for syncope, light-headedness and headaches.  All other systems reviewed and are negative.   Per HPI unless specifically indicated above     Medication List       This list is accurate as of: 03/26/15  3:55 PM.   Always use your most recent med list.               amLODipine 5 MG tablet  Commonly known as:  NORVASC  Take 1 tablet (5 mg total) by mouth daily.     aspirin 81 MG chewable tablet  Chew 81 mg by mouth daily.     atorvastatin 40 MG tablet  Commonly known as:  LIPITOR  Take 1 tablet (40 mg total) by mouth daily.     citalopram 40 MG tablet  Commonly known as:  CELEXA  Take 1 tablet (40 mg total) by mouth daily.     fluticasone 50 MCG/ACT nasal spray  Commonly known as:  FLONASE  Place 1 spray into both nostrils 2 (two) times daily.     levothyroxine 88 MCG tablet  Commonly known as:  SYNTHROID, LEVOTHROID  Take 1 tablet (88 mcg total) by mouth daily.     lisinopril 20 MG tablet  Commonly known as:  PRINIVIL,ZESTRIL  Take 2 tablets (40 mg total) by mouth daily.     metFORMIN 500 MG tablet  Commonly known as:  GLUCOPHAGE  Take 1 tablet (500 mg total) by mouth 2 (two) times daily with a meal.     sodium chloride 0.65 % nasal spray  Commonly known as:  OCEAN  Place 1 spray into the nose as needed. Nasal dryness     sulfamethoxazole-trimethoprim 800-160 MG tablet  Commonly known as:  BACTRIM  DS,SEPTRA DS  Take 1 tablet by mouth 2 (two) times daily.          Objective:    BP 159/105 mmHg  Pulse 93  Temp(Src) 100 F (37.8 C) (Oral)  Ht 6' (1.829 m)  Wt 321 lb 9.6 oz (145.877 kg)  BMI 43.61 kg/m2  Wt Readings from Last 3 Encounters:  03/26/15 321 lb 9.6 oz (145.877 kg)  12/21/14 323 lb (146.512 kg)  09/18/14 311 lb (141.069 kg)    Physical Exam  Constitutional: He is oriented to person, place, and time. He appears well-developed and well-nourished. No distress.  Eyes: Conjunctivae and EOM are normal. Pupils are equal, round, and reactive to light. Right eye exhibits no discharge. No scleral icterus.  Cardiovascular: Normal rate, regular rhythm, normal heart sounds and intact distal pulses.   No murmur heard. Pulmonary/Chest: Effort normal and breath sounds  normal. No respiratory distress. He has no wheezes.  Musculoskeletal: Normal range of motion. He exhibits no edema.  Neurological: He is alert and oriented to person, place, and time. Coordination normal.  Skin: Skin is warm and dry. No laceration and no rash noted. He is not diaphoretic. There is erythema (he has pain and erythema and swelling of his middle fingeron his right hand on the lateral aspect of it in the paronychia region. No drainage is noted and no fluctuance is noted but is significantly tender to exam.).  Psychiatric: He has a normal mood and affect. His behavior is normal.  Nursing note and vitals reviewed.     Assessment & Plan:       Problem List Items Addressed This Visit    None    Visit Diagnoses    Cellulitis of finger of right hand    -  Primary    right lateral 2nd finger, give bactrim and take ibuprofen, come back in 10-12 days for recheck    Relevant Medications    sulfamethoxazole-trimethoprim (BACTRIM DS,SEPTRA DS) 800-160 MG tablet        Follow up plan: Return in about 10 days (around 04/05/2015), or if symptoms worsen or fail to improve, for f/u cellulitis/ workers comp.  Counseling provided for all of the vaccine components No orders of the defined types were placed in this encounter.    Arville CareJoshua Dettinger, MD [facility] 03/26/2015, 3:55 PM

## 2015-03-29 ENCOUNTER — Telehealth: Payer: Self-pay | Admitting: Family

## 2015-03-29 ENCOUNTER — Ambulatory Visit: Payer: 59 | Admitting: Family

## 2015-03-31 ENCOUNTER — Encounter: Payer: Self-pay | Admitting: Family

## 2015-03-31 NOTE — Telephone Encounter (Signed)
Marked to charge and mailing letter °

## 2015-03-31 NOTE — Telephone Encounter (Signed)
Pt was no show 03/29/15 5:15pm for follow up appt, per notes in chart pt my have changed PCP, charge or no charge?

## 2015-03-31 NOTE — Telephone Encounter (Signed)
Yes please

## 2015-04-05 ENCOUNTER — Other Ambulatory Visit: Payer: Self-pay | Admitting: Family Medicine

## 2015-04-09 ENCOUNTER — Ambulatory Visit (INDEPENDENT_AMBULATORY_CARE_PROVIDER_SITE_OTHER): Payer: Worker's Compensation | Admitting: Family Medicine

## 2015-04-09 ENCOUNTER — Encounter: Payer: Self-pay | Admitting: Family Medicine

## 2015-04-09 VITALS — BP 143/100 | HR 59 | Temp 99.1°F | Ht 72.0 in | Wt 322.0 lb

## 2015-04-09 DIAGNOSIS — L03011 Cellulitis of right finger: Secondary | ICD-10-CM | POA: Diagnosis not present

## 2015-04-09 NOTE — Progress Notes (Signed)
BP 143/100 mmHg  Pulse 59  Temp(Src) 99.1 F (37.3 C) (Oral)  Ht 6' (1.829 m)  Wt 322 lb (146.058 kg)  BMI 43.66 kg/m2   Subjective:    Patient ID: [name], male    DOB: 1976-02-03, 40 y.o.   MRN: [id number]  HPI: [name] is a 40 y.o. male presenting on 04/09/2015 for Worker's comp f/u - cellulitis of finger   HPI Finger swelling and pain Patient is coming in today for a workers comp visit. He works for [employer] and believes he may have injured his finger with a piece of metal on 03/24/2015. His finger started to become inflamed and more painful over yesterday 03/25/2015 and that is what brought him in here today. He has been on Bactrim for 10 days and the finger is completely healed and swelling is gone and pain is gone as well. He denies any fevers and chills as well.  Relevant past medical, surgical, family and social history reviewed and updated as indicated. Interim medical history since our last visit reviewed. Allergies and medications reviewed and updated.  Review of Systems  Constitutional: Negative for fever and chills.  HENT: Negative for ear discharge and ear pain.   Eyes: Negative for discharge and visual disturbance.  Respiratory: Negative for shortness of breath and wheezing.   Cardiovascular: Negative for chest pain and leg swelling.  Gastrointestinal: Negative for abdominal pain, diarrhea and constipation.  Genitourinary: Negative for difficulty urinating.  Musculoskeletal: Negative for back pain and gait problem.  Skin: Negative for color change, rash and wound.  Neurological: Negative for syncope, light-headedness and headaches.  All other systems reviewed and are negative.   Per HPI unless specifically indicated above     Medication List       This list is accurate as of: 04/09/15  4:26 PM.  Always use your most recent med list.               amLODipine 5 MG tablet  Commonly known as:  NORVASC  Take 1 tablet (5 mg total) by mouth  daily.     aspirin 81 MG chewable tablet  Chew 81 mg by mouth daily.     atorvastatin 40 MG tablet  Commonly known as:  LIPITOR  Take 1 tablet (40 mg total) by mouth daily.     citalopram 40 MG tablet  Commonly known as:  CELEXA  Take 1 tablet (40 mg total) by mouth daily.     fluticasone 50 MCG/ACT nasal spray  Commonly known as:  FLONASE  Place 1 spray into both nostrils 2 (two) times daily.     levothyroxine 88 MCG tablet  Commonly known as:  SYNTHROID, LEVOTHROID  Take 1 tablet (88 mcg total) by mouth daily.     lisinopril 20 MG tablet  Commonly known as:  PRINIVIL,ZESTRIL  Take 2 tablets (40 mg total) by mouth daily.     metFORMIN 500 MG tablet  Commonly known as:  GLUCOPHAGE  Take 1 tablet (500 mg total) by mouth 2 (two) times daily with a meal.     sodium chloride 0.65 % nasal spray  Commonly known as:  OCEAN  Place 1 spray into the nose as needed. Nasal dryness     sulfamethoxazole-trimethoprim 800-160 MG tablet  Commonly known as:  BACTRIM DS,SEPTRA DS  Take 1 tablet by mouth 2 (two) times daily.          Objective:    BP 143/100 mmHg  Pulse  59  Temp(Src) 99.1 F (37.3 C) (Oral)  Ht 6' (1.829 m)  Wt 322 lb (146.058 kg)  BMI 43.66 kg/m2  Wt Readings from Last 3 Encounters:  04/09/15 322 lb (146.058 kg)  03/26/15 321 lb 9.6 oz (145.877 kg)  12/21/14 323 lb (146.512 kg)    Physical Exam  Constitutional: He is oriented to person, place, and time. He appears well-developed and well-nourished. No distress.  Eyes: Conjunctivae and EOM are normal. Pupils are equal, round, and reactive to light. Right eye exhibits no discharge. No scleral icterus.  Cardiovascular: Normal rate, regular rhythm, normal heart sounds and intact distal pulses.   No murmur heard. Pulmonary/Chest: Effort normal and breath sounds normal. No respiratory distress. He has no wheezes.  Musculoskeletal: Normal range of motion. He exhibits no edema.  Neurological: He is alert and  oriented to person, place, and time. Coordination normal.  Skin: Skin is warm and dry. No laceration and no rash noted. He is not diaphoretic. No erythema (Pain drainage and swelling are completely resolved, no further issues.).  Psychiatric: He has a normal mood and affect. His behavior is normal.  Nursing note and vitals reviewed.     Assessment & Plan:   Problem List Items Addressed This Visit    None    Visit Diagnoses    Cellulitis of finger of right hand    -  Primary    Resolved, cleared for worker's comp and cleared to go back to work        Follow up plan: Return if symptoms worsen or fail to improve.  Counseling provided for all of the vaccine components No orders of the defined types were placed in this encounter.    Arville Care, MD [facility] 04/09/2015, 4:26 PM

## 2015-05-13 ENCOUNTER — Telehealth: Payer: Self-pay | Admitting: Behavioral Health

## 2015-05-13 NOTE — Telephone Encounter (Signed)
Unable to reach patient at time of Pre-Visit Call.  Left message for patient to return call when available.    

## 2015-05-14 ENCOUNTER — Ambulatory Visit (INDEPENDENT_AMBULATORY_CARE_PROVIDER_SITE_OTHER): Payer: 59 | Admitting: Family

## 2015-05-14 ENCOUNTER — Encounter: Payer: Self-pay | Admitting: Family

## 2015-05-14 ENCOUNTER — Other Ambulatory Visit: Payer: Self-pay | Admitting: Family

## 2015-05-14 VITALS — BP 158/98 | HR 84 | Temp 98.1°F | Resp 16 | Ht 72.0 in | Wt 318.0 lb

## 2015-05-14 DIAGNOSIS — E538 Deficiency of other specified B group vitamins: Secondary | ICD-10-CM

## 2015-05-14 DIAGNOSIS — B351 Tinea unguium: Secondary | ICD-10-CM

## 2015-05-14 DIAGNOSIS — E559 Vitamin D deficiency, unspecified: Secondary | ICD-10-CM

## 2015-05-14 DIAGNOSIS — G4733 Obstructive sleep apnea (adult) (pediatric): Secondary | ICD-10-CM

## 2015-05-14 DIAGNOSIS — E119 Type 2 diabetes mellitus without complications: Secondary | ICD-10-CM

## 2015-05-14 DIAGNOSIS — E039 Hypothyroidism, unspecified: Secondary | ICD-10-CM | POA: Diagnosis not present

## 2015-05-14 DIAGNOSIS — I10 Essential (primary) hypertension: Secondary | ICD-10-CM

## 2015-05-14 DIAGNOSIS — E785 Hyperlipidemia, unspecified: Secondary | ICD-10-CM | POA: Diagnosis not present

## 2015-05-14 DIAGNOSIS — F411 Generalized anxiety disorder: Secondary | ICD-10-CM

## 2015-05-14 LAB — HEPATIC FUNCTION PANEL
ALBUMIN: 4.4 g/dL (ref 3.5–5.2)
ALT: 22 U/L (ref 0–53)
AST: 20 U/L (ref 0–37)
Alkaline Phosphatase: 46 U/L (ref 39–117)
Bilirubin, Direct: 0.1 mg/dL (ref 0.0–0.3)
Total Bilirubin: 0.3 mg/dL (ref 0.2–1.2)
Total Protein: 7.9 g/dL (ref 6.0–8.3)

## 2015-05-14 LAB — BASIC METABOLIC PANEL
BUN: 15 mg/dL (ref 6–23)
CO2: 30 mEq/L (ref 19–32)
Calcium: 9.6 mg/dL (ref 8.4–10.5)
Chloride: 102 mEq/L (ref 96–112)
Creatinine, Ser: 0.97 mg/dL (ref 0.40–1.50)
GFR: 111.72 mL/min (ref >60.00)
Glucose, Bld: 105 mg/dL — ABNORMAL HIGH (ref 70–99)
POTASSIUM: 3.8 meq/L (ref 3.5–5.1)
SODIUM: 139 meq/L (ref 135–145)

## 2015-05-14 LAB — LIPID PANEL
CHOL/HDL RATIO: 5
Cholesterol: 225 mg/dL — ABNORMAL HIGH (ref 0–200)
HDL: 45.5 mg/dL (ref >39.00)
LDL Cholesterol: 166 mg/dL — ABNORMAL HIGH (ref 0–99)
NONHDL: 179.92
Triglycerides: 71 mg/dL (ref 0.0–149.0)
VLDL: 14.2 mg/dL (ref 0.0–40.0)

## 2015-05-14 LAB — MICROALBUMIN / CREATININE URINE RATIO
CREATININE, U: 207.3 mg/dL
Microalb Creat Ratio: 3.3 mg/g (ref 0.0–30.0)
Microalb, Ur: 6.9 mg/dL — ABNORMAL HIGH (ref 0.0–1.9)

## 2015-05-14 LAB — HEMOGLOBIN A1C: HEMOGLOBIN A1C: 7 % — AB (ref 4.6–6.5)

## 2015-05-14 LAB — VITAMIN D 25 HYDROXY (VIT D DEFICIENCY, FRACTURES): VITD: 13.13 ng/mL — AB (ref 30.00–100.00)

## 2015-05-14 LAB — TSH: TSH: 1.75 u[IU]/mL (ref 0.35–4.50)

## 2015-05-14 LAB — VITAMIN B12: VITAMIN B 12: 292 pg/mL (ref 211–911)

## 2015-05-14 MED ORDER — LOVASTATIN 20 MG PO TABS: 20.0000 mg | ORAL_TABLET | Freq: Every day | ORAL | Status: DC

## 2015-05-14 MED ORDER — AMLODIPINE BESYLATE 10 MG PO TABS: 10.0000 mg | ORAL_TABLET | Freq: Every day | ORAL | Status: DC

## 2015-05-14 MED ORDER — LEVOTHYROXINE SODIUM 88 MCG PO TABS: 88.0000 ug | ORAL_TABLET | Freq: Every day | ORAL | Status: DC

## 2015-05-14 MED ORDER — CITALOPRAM HYDROBROMIDE 40 MG PO TABS: 40.0000 mg | ORAL_TABLET | Freq: Every day | ORAL | Status: DC

## 2015-05-14 MED ORDER — LISINOPRIL 20 MG PO TABS: 40.0000 mg | ORAL_TABLET | Freq: Every day | ORAL | Status: DC

## 2015-05-14 MED ORDER — TERBINAFINE HCL 250 MG PO TABS: 250.0000 mg | ORAL_TABLET | Freq: Every day | ORAL | Status: DC

## 2015-05-14 MED ORDER — METFORMIN HCL 500 MG PO TABS: 500.0000 mg | ORAL_TABLET | Freq: Two times a day (BID) | ORAL | Status: DC

## 2015-05-14 NOTE — Assessment & Plan Note (Signed)
Clinically stable on synthroid, obtain tsh. 

## 2015-05-14 NOTE — Assessment & Plan Note (Signed)
Trial of meloxicam.  

## 2015-05-14 NOTE — Assessment & Plan Note (Signed)
Obtain flp, if above goal, plan to switch back to crestor which he has tolerated in the past.

## 2015-05-14 NOTE — Assessment & Plan Note (Signed)
We discussed risks or untreated OSA. Advised pt to contact pulmonary and arrange follow up visit.  We also discussed weight loss.

## 2015-05-14 NOTE — Assessment & Plan Note (Signed)
Stable on citalopram. Continue same.  

## 2015-05-14 NOTE — Assessment & Plan Note (Signed)
Obtain vit d level 

## 2015-05-14 NOTE — Assessment & Plan Note (Signed)
Obtain b12 level.  

## 2015-05-14 NOTE — Telephone Encounter (Signed)
Left message on home # for pt to return my call. 

## 2015-05-14 NOTE — Patient Instructions (Addendum)
Please schedule a follow up appointment with Dr. Craige CottaSood or Tammy Parrett to discuss your CPAP- [phone]. Start lamisil for your toenails/athletes foot. Increase amlodipine from 5mg  to 10mg .

## 2015-05-14 NOTE — Telephone Encounter (Signed)
Cholesterol above goal.  D/C rx for mevacor.  See rx for crestor.  Vitamin D level is low.  Advise patient to begin vit D 50000 units once weekly for 12 weeks, then repeat vit D level (dx Vit D deficiency).   Thyroid looks good. Sugar control fair- continue current meds and work on diet/exercise/weight loss. Follow up in 3 months.

## 2015-05-14 NOTE — Progress Notes (Signed)
Subjective:    Patient ID: [name], male    DOB: 1976-01-03, 40 y.o.   MRN: [id number]  HPI  [name] is a 40 yr old male who presents today for follow up.  1) Vit D deficiency- not currently on vit D supplement.   2) Vitamin B12 deficiency- has never taken shots.   3) DM2- denies polyuria/polydipsia.  On metformin, trying to work on diet.   Wt Readings from Last 3 Encounters:  05/14/15 318 lb (144.244 kg)  04/09/15 322 lb (146.058 kg)  03/26/15 321 lb 9.6 oz (145.877 kg)    Lab Results  Component Value Date   HGBA1C 6.7* 01/18/2015   HGBA1C 6.6* 09/15/2014   HGBA1C 7.1* 06/16/2014   Lab Results  Component Value Date   MICROALBUR 1.1 01/18/2015   LDLCALC 195* 01/18/2015   CREATININE 1.01 01/18/2015   4) OSA- patient began CPAP in July- pt is followed by pulmonary, Tammy Parrett/Dr. Craige Cotta. Reports that he did not comply and insurance took back his machine  5) Onychomycosis/tinea pedis-  No improvement with otc lamisil   6) HTN- current BP med includes lisinopril  and amlodipine .  BP Readings from Last 3 Encounters:  05/14/15 158/98  04/09/15 143/100  03/26/15 159/105   7) Hyperlipidemia-  Currently maintained on lovastatin.  Did not start lipitor due to fear of increased LFT.   Reports increased LFT on lipitor in the past but tolerated crestor. Denies myalgia.   Lab Results  Component Value Date   CHOL 247* 01/18/2015   HDL 34.60* 01/18/2015   LDLCALC 195* 01/18/2015   LDLDIRECT 208.6 03/26/2013   TRIG 88.0 01/18/2015   CHOLHDL 7 01/18/2015   8) Hypothyroid- maintained on synthroid. Feels well on lovastatin.  Lab Results  Component Value Date   TSH 1.99 01/18/2015   9) Anxiety- pt is maintained on citalopram .  Reports that he feels good on this dose. Denies anxiety.    He does report blister right great toe- applying antibiotic ointment and wound is improving.   Review of Systems See HPI  Past Medical History  Diagnosis Date  .  Hypertension   . Depression   . Anxiety   . Hyperlipidemia   . Hypothyroidism   . Sinus congestion   . Obesity   . Diabetes mellitus without complication (HCC)   . OSA (obstructive sleep apnea)     Social History   Social History  . Marital Status: Married    Spouse Name: N/A  . Number of Children: N/A  . Years of Education: N/A   Occupational History  . 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 Smoking status: Never Smoker   . Smokeless tobacco: Not on file  . Alcohol Use: No  . Drug Use: No  . Sexual Activity: Not on file   Other Topics Concern  . Not on file   Social History Narrative    Past Surgical History  Procedure Laterality Date  . Hernia repair  1990    Right ingruial & umbilical [place]   . Repair right arm fracture  1996    [place]   . Fracture surgery      right arch  . Uvulopalatopharyngoplasty (uppp)/tonsillectomy/septoplasty    . Left heart catheterization with coronary angiogram N/A 09/26/2011    Procedure: LEFT HEART CATHETERIZATION WITH CORONARY ANGIOGRAM;  Surgeon: Pamella Pert, MD;  Location: [facility] CATH LAB;  Service: Cardiovascular;  Laterality:  N/A;    Family History  Problem Relation Age of Onset  . Hypertension Mother   . Hypertension Father   . Heart attack Father   . Down syndrome Brother   . Breast cancer Paternal Grandmother   . Diabetes Paternal Grandmother   . Kidney disease Maternal Grandfather     Allergies  Allergen Reactions  . Atorvastatin Other (See Comments)    Elevated liver enzymes  . Cymbalta [Duloxetine Hcl] Rash    Current Outpatient Prescriptions on File Prior to Visit  Medication Sig Dispense Refill  . amLODipine (NORVASC) 5 MG tablet Take 1 tablet (5 mg total) by mouth daily. 30 tablet 5  . aspirin 81 MG chewable tablet Chew 81 mg by mouth daily.    . citalopram (CELEXA) 40 MG tablet Take 1 tablet (40 mg total) by mouth daily. 30 tablet 3  . fluticasone (FLONASE) 50  MCG/ACT nasal spray Place 1 spray into both nostrils 2 (two) times daily. 16 g 6  . levothyroxine (SYNTHROID, LEVOTHROID) 88 MCG tablet Take 1 tablet (88 mcg total) by mouth daily. 30 tablet 5  . lisinopril (PRINIVIL,ZESTRIL) 20 MG tablet Take 2 tablets (40 mg total) by mouth daily. 60 tablet 5  . metFORMIN (GLUCOPHAGE) 500 MG tablet Take 1 tablet (500 mg total) by mouth 2 (two) times daily with a meal. 60 tablet 2  . sodium chloride (OCEAN) 0.65 % nasal spray Place 1 spray into the nose as needed. Nasal dryness     No current facility-administered medications on file prior to visit.    BP 158/98 mmHg  Pulse 84  Temp(Src) 98.1 F (36.7 C) (Oral)  Resp 16  Ht 6' (1.829 m)  Wt 318 lb (144.244 kg)  BMI 43.12 kg/m2  SpO2 100%       Objective:   Physical Exam  Constitutional: He is oriented to person, place, and time. He appears well-developed and well-nourished. No distress.  HENT:  Head: Normocephalic and atraumatic.  Cardiovascular: Normal rate and regular rhythm.   No murmur heard. Pulmonary/Chest: Effort normal and breath sounds normal. No respiratory distress. He has no wheezes. He has no rales.  Musculoskeletal: He exhibits no edema.  Neurological: He is alert and oriented to person, place, and time.  Skin: Skin is warm and dry.  Tinea pedis and hypertrophic toenails bilaterally. Heavy calluses  Psychiatric: He has a normal mood and affect. His behavior is normal. Thought content normal.          Assessment & Plan:

## 2015-05-14 NOTE — Progress Notes (Signed)
Pre visit review using our clinic review tool, if applicable. No additional management support is needed unless otherwise documented below in the visit note. 

## 2015-05-14 NOTE — Assessment & Plan Note (Signed)
Uncontrolled. Increase amlodipine from 5mg  to 10mg . Continue lisinopril.

## 2015-05-28 ENCOUNTER — Telehealth: Payer: Self-pay | Admitting: Family

## 2015-05-28 NOTE — Telephone Encounter (Signed)
Relation to ZO:XWRUpt:self Call back number:[phone]   Reason for call:  Patient inquiring about lab results patient stated he missed a call. Please advise

## 2015-05-28 NOTE — Telephone Encounter (Signed)
See 3/3 refill noted for details.

## 2015-05-31 MED ORDER — ROSUVASTATIN CALCIUM 20 MG PO TABS: 20.0000 mg | ORAL_TABLET | Freq: Every day | ORAL | Status: DC

## 2015-05-31 MED ORDER — VITAMIN D (ERGOCALCIFEROL) 1.25 MG (50000 UNIT) PO CAPS: 50000.0000 [IU] | ORAL_CAPSULE | ORAL | Status: DC

## 2015-05-31 NOTE — Telephone Encounter (Signed)
Pt has been notified per 05/14/15 phone note. Rxs sent for Crestor and Vitamin D. 3 month f/u scheduled for 08/20/15 at 7am. Will need vitamin D recheck at that visit.

## 2015-06-04 ENCOUNTER — Encounter: Payer: Self-pay | Admitting: Family

## 2015-06-04 ENCOUNTER — Ambulatory Visit (INDEPENDENT_AMBULATORY_CARE_PROVIDER_SITE_OTHER): Payer: 59 | Admitting: Family

## 2015-06-04 VITALS — BP 153/97 | HR 74 | Temp 98.2°F | Resp 18 | Ht 72.0 in | Wt 319.2 lb

## 2015-06-04 DIAGNOSIS — G4733 Obstructive sleep apnea (adult) (pediatric): Secondary | ICD-10-CM

## 2015-06-04 DIAGNOSIS — I10 Essential (primary) hypertension: Secondary | ICD-10-CM

## 2015-06-04 MED ORDER — HYDROCHLOROTHIAZIDE 25 MG PO TABS: 25.0000 mg | ORAL_TABLET | Freq: Every day | ORAL | Status: DC

## 2015-06-04 MED ORDER — LISINOPRIL 40 MG PO TABS: 40.0000 mg | ORAL_TABLET | Freq: Every day | ORAL | Status: DC

## 2015-06-04 NOTE — Patient Instructions (Addendum)
Continue lisinopril 40mg  once daily. Continue current dose of amlodipine Add HCTZ (fluid pill) once daily in the AM. Follow up in 2 weeks for a nurse visit blood pressure check and lab work.

## 2015-06-04 NOTE — Assessment & Plan Note (Signed)
BP still above goal. Will add hctz once daily.  Follow up in 2 weeks for BP recheck and follow up bmet.

## 2015-06-04 NOTE — Progress Notes (Signed)
Pre visit review using our clinic review tool, if applicable. No additional management support is needed unless otherwise documented below in the visit note. 

## 2015-06-04 NOTE — Progress Notes (Signed)
Subjective:    Patient ID: [name], male    DOB: Mar 07, 1976, 40 y.o.   MRN: [id number]  HPI  Mr. Culbertson is a 40 yr old male who presents today for follow up of his hypertension.  Current BP regimen includes lisinopril  daily amlodipine .  Last visit amlodipine dose was increased from  to .  Pt reports that he only started the higher dose of amlodipine 3 days ago.  Denies CP/SOB or swelling. Requests follow up appointment with Tammy Parrett for his OSA.   BP Readings from Last 3 Encounters:  06/04/15 153/97  05/14/15 158/98  04/09/15 143/100     Review of Systems    see HPI  Past Medical History  Diagnosis Date  . Hypertension   . Depression   . Anxiety   . Hyperlipidemia   . Hypothyroidism   . Sinus congestion   . Obesity   . Diabetes mellitus without complication (HCC)   . OSA (obstructive sleep apnea)     Social History   Social History  . Marital Status: Married    Spouse Name: N/A  . Number of Children: N/A  . Years of Education: N/A   Occupational History  . 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 Smoking status: Never Smoker   . Smokeless tobacco: Not on file  . Alcohol Use: No  . Drug Use: No  . Sexual Activity: Not on file   Other Topics Concern  . Not on file   Social History Narrative    Past Surgical History  Procedure Laterality Date  . Hernia repair  1990    Right ingruial & umbilical [place]   . Repair right arm fracture  1996    [place]   . Fracture surgery      right arch  . Uvulopalatopharyngoplasty (uppp)/tonsillectomy/septoplasty    . Left heart catheterization with coronary angiogram N/A 09/26/2011    Procedure: LEFT HEART CATHETERIZATION WITH CORONARY ANGIOGRAM;  Surgeon: Pamella Pert, MD;  Location: [facility] CATH LAB;  Service: Cardiovascular;  Laterality: N/A;    Family History  Problem Relation Age of Onset  . Hypertension Mother   . Hypertension Father   . Heart  attack Father   . Down syndrome Brother   . Breast cancer Paternal Grandmother   . Diabetes Paternal Grandmother   . Kidney disease Maternal Grandfather     Allergies  Allergen Reactions  . Atorvastatin Other (See Comments)    Elevated liver enzymes  . Cymbalta [Duloxetine Hcl] Rash    Current Outpatient Prescriptions on File Prior to Visit  Medication Sig Dispense Refill  . amLODipine (NORVASC) 10 MG tablet Take 1 tablet (10 mg total) by mouth daily. 90 tablet 3  . aspirin 81 MG chewable tablet Chew 81 mg by mouth daily.    . citalopram (CELEXA) 40 MG tablet Take 1 tablet (40 mg total) by mouth daily. 30 tablet 3  . fluticasone (FLONASE) 50 MCG/ACT nasal spray Place 1 spray into both nostrils 2 (two) times daily. 16 g 6  . levothyroxine (SYNTHROID, LEVOTHROID) 88 MCG tablet Take 1 tablet (88 mcg total) by mouth daily. 30 tablet 5  . lisinopril (PRINIVIL,ZESTRIL) 20 MG tablet Take 2 tablets (40 mg total) by mouth daily. 60 tablet 5  . metFORMIN (GLUCOPHAGE) 500 MG tablet Take 1 tablet (500 mg total) by mouth 2 (two) times daily with a meal. 60 tablet 2  . rosuvastatin (  CRESTOR) 20 MG tablet Take 1 tablet (20 mg total) by mouth daily. 90 tablet 1  . sodium chloride (OCEAN) 0.65 % nasal spray Place 1 spray into the nose as needed. Nasal dryness    . terbinafine (LAMISIL) 250 MG tablet Take 1 tablet (250 mg total) by mouth daily. 30 tablet 0  . Vitamin D, Ergocalciferol, (DRISDOL) 50000 units CAPS capsule Take 1 capsule (50,000 Units total) by mouth every 7 (seven) days. 12 capsule 0   No current facility-administered medications on file prior to visit.    BP 153/97 mmHg  Pulse 74  Temp(Src) 98.2 F (36.8 C) (Oral)  Resp 18  Ht 6' (1.829 m)  Wt 319 lb 3.2 oz (144.788 kg)  BMI 43.28 kg/m2  SpO2 99%    Objective:   Physical Exam  Constitutional: He is oriented to person, place, and time. He appears well-developed and well-nourished. No distress.  HENT:  Head: Normocephalic  and atraumatic.  Cardiovascular: Normal rate and regular rhythm.   No murmur heard. Pulmonary/Chest: Effort normal and breath sounds normal. No respiratory distress. He has no wheezes. He has no rales.  Musculoskeletal: He exhibits no edema.  Neurological: He is alert and oriented to person, place, and time.  Skin: Skin is warm and dry.  Psychiatric: He has a normal mood and affect. His behavior is normal. Thought content normal.          Assessment & Plan:

## 2015-06-18 ENCOUNTER — Ambulatory Visit (INDEPENDENT_AMBULATORY_CARE_PROVIDER_SITE_OTHER): Payer: 59 | Admitting: Family

## 2015-06-18 VITALS — BP 138/90 | HR 82

## 2015-06-18 DIAGNOSIS — I10 Essential (primary) hypertension: Secondary | ICD-10-CM | POA: Diagnosis not present

## 2015-06-18 LAB — BASIC METABOLIC PANEL
BUN: 20 mg/dL (ref 6–23)
CALCIUM: 9.6 mg/dL (ref 8.4–10.5)
CO2: 32 meq/L (ref 19–32)
CREATININE: 1.07 mg/dL (ref 0.40–1.50)
Chloride: 100 mEq/L (ref 96–112)
GFR: 99.71 mL/min (ref >60.00)
GLUCOSE: 103 mg/dL — AB (ref 70–99)
Potassium: 4.1 mEq/L (ref 3.5–5.1)
Sodium: 138 mEq/L (ref 135–145)

## 2015-06-18 NOTE — Progress Notes (Signed)
Pre visit review using our clinic review tool, if applicable. No additional management support is needed unless otherwise documented below in the visit note.  Patient presents in office today for a blood pressure check. Reviewed medications with the patient. Readings were as follow: BP 144/87 P 85 & BP 138/90 P 82.   Per Peggyann Juba'Sullivan, NP: Continue current medication regimen. Complete BMET lab work. Follow-up with PCP in 3 months. Informed patient of the provider's instructions. He voiced  understanding and did not have any further concerns prior to leaving nurse visit.

## 2015-06-18 NOTE — Progress Notes (Signed)
Noted  

## 2015-06-18 NOTE — Patient Instructions (Signed)
Per Peggyann Juba'Sullivan, NP: Continue current medication regimen. Complete BMET lab work. Follow-up with PCP in 3 months.

## 2015-06-28 ENCOUNTER — Ambulatory Visit: Payer: 59 | Admitting: Adult Health

## 2015-07-02 ENCOUNTER — Other Ambulatory Visit: Payer: Self-pay | Admitting: Family

## 2015-07-02 DIAGNOSIS — Z5181 Encounter for therapeutic drug level monitoring: Secondary | ICD-10-CM

## 2015-07-04 NOTE — Telephone Encounter (Signed)
Needs LFT first please, dx onychomycosis.

## 2015-07-05 NOTE — Telephone Encounter (Signed)
Notified pt and he scheduled lab appt for 07/16/15, future order entered. Rx request denied. Will need to send after LFTS are completed.

## 2015-07-12 ENCOUNTER — Encounter: Payer: Self-pay | Admitting: Family

## 2015-07-12 ENCOUNTER — Other Ambulatory Visit (INDEPENDENT_AMBULATORY_CARE_PROVIDER_SITE_OTHER): Payer: 59

## 2015-07-12 ENCOUNTER — Other Ambulatory Visit: Payer: Self-pay | Admitting: Family

## 2015-07-12 DIAGNOSIS — Z5181 Encounter for therapeutic drug level monitoring: Secondary | ICD-10-CM

## 2015-07-12 LAB — HEPATIC FUNCTION PANEL
ALK PHOS: 43 U/L (ref 39–117)
ALT: 24 U/L (ref 0–53)
AST: 19 U/L (ref 0–37)
Albumin: 4 g/dL (ref 3.5–5.2)
BILIRUBIN DIRECT: 0.1 mg/dL (ref 0.0–0.3)
BILIRUBIN TOTAL: 0.2 mg/dL (ref 0.2–1.2)
TOTAL PROTEIN: 7.4 g/dL (ref 6.0–8.3)

## 2015-07-12 MED ORDER — TERBINAFINE HCL 250 MG PO TABS: 250.0000 mg | ORAL_TABLET | Freq: Every day | ORAL | Status: DC

## 2015-07-16 ENCOUNTER — Other Ambulatory Visit: Payer: 59

## 2015-07-28 MED FILL — AMLODIPINE BESYLATE 10 MG T: 10 | 90 days supply | Qty: 90 | Fill #0 | Status: TO

## 2015-08-20 ENCOUNTER — Ambulatory Visit: Payer: 59 | Admitting: Family

## 2015-08-23 MED FILL — ROSUVASTATIN CALCIUM 20 MG: 20 | 90 days supply | Qty: 90 | Fill #0

## 2015-08-23 MED FILL — TERBINAFINE HCL 250 MG TAB: 250 | 30 days supply | Qty: 30 | Fill #0

## 2015-09-17 ENCOUNTER — Ambulatory Visit: Payer: 59 | Admitting: Family

## 2015-09-17 DIAGNOSIS — Z0289 Encounter for other administrative examinations: Secondary | ICD-10-CM

## 2015-09-21 ENCOUNTER — Encounter: Payer: Self-pay | Admitting: Family

## 2015-09-21 ENCOUNTER — Telehealth: Payer: Self-pay | Admitting: Family

## 2015-09-21 NOTE — Telephone Encounter (Signed)
Patient was No Show September 17, 2015. Last No Show 03/29/2015. Charge or No Charge?

## 2015-09-21 NOTE — Telephone Encounter (Signed)
Yes please

## 2015-09-24 MED FILL — CITALOPRAM HBR 40 MG TABLET: 40 | 30 days supply | Qty: 30 | Fill #0

## 2015-09-24 MED FILL — LISINOPRIL 40 MG TABLET: 40 | 30 days supply | Qty: 30 | Fill #0

## 2015-09-24 MED FILL — LEVOTHYROXINE 88 MCG TABLET: 88 | 30 days supply | Qty: 30 | Fill #0

## 2015-09-24 MED FILL — HYDROCHLOROTHIAZIDE 25 MG T: 25 | 30 days supply | Qty: 30 | Fill #0

## 2015-09-27 MED FILL — metFORMIN HCL 500 MG TABS: 500 | 30 days supply | Qty: 60 | Fill #0

## 2015-10-04 ENCOUNTER — Institutional Professional Consult (permissible substitution): Payer: 59 | Admitting: Pulmonary Disease

## 2015-10-22 ENCOUNTER — Telehealth: Payer: Self-pay | Admitting: Family

## 2015-10-22 NOTE — Telephone Encounter (Signed)
Patient is having fatigue and sleepy during the day. Already had sleep study and is scheduled to see Pulmonary in September. Wants to know if this could be from is meds?  NOS appt on 09/17/15, patient states that was to be cancelled by our office, patient is receiving a bill, please advise

## 2015-10-23 NOTE — Telephone Encounter (Signed)
OK to waive no show fee.  Needs OV for further evaluation of his fatigue.

## 2015-10-25 NOTE — Telephone Encounter (Signed)
Fee has been Environmental managerwaives

## 2015-10-25 NOTE — Telephone Encounter (Signed)
Pt has been scheduled for 8/18 at 7:45a.

## 2015-10-25 NOTE — Telephone Encounter (Signed)
Shiquita-- please call pt to schedule appt with Melissa of below concerns. Thanks!  Ebony-- please see no show waiver.

## 2015-10-29 ENCOUNTER — Telehealth: Payer: Self-pay | Admitting: Family

## 2015-10-29 ENCOUNTER — Ambulatory Visit (INDEPENDENT_AMBULATORY_CARE_PROVIDER_SITE_OTHER): Payer: 59 | Admitting: Family

## 2015-10-29 ENCOUNTER — Encounter: Payer: Self-pay | Admitting: Family

## 2015-10-29 VITALS — BP 152/100 | HR 71 | Temp 97.7°F | Resp 16 | Ht 72.0 in | Wt 319.0 lb

## 2015-10-29 DIAGNOSIS — E559 Vitamin D deficiency, unspecified: Secondary | ICD-10-CM | POA: Diagnosis not present

## 2015-10-29 DIAGNOSIS — B351 Tinea unguium: Secondary | ICD-10-CM

## 2015-10-29 DIAGNOSIS — E785 Hyperlipidemia, unspecified: Secondary | ICD-10-CM | POA: Diagnosis not present

## 2015-10-29 DIAGNOSIS — E538 Deficiency of other specified B group vitamins: Secondary | ICD-10-CM | POA: Diagnosis not present

## 2015-10-29 DIAGNOSIS — I10 Essential (primary) hypertension: Secondary | ICD-10-CM

## 2015-10-29 DIAGNOSIS — E1121 Type 2 diabetes mellitus with diabetic nephropathy: Secondary | ICD-10-CM

## 2015-10-29 DIAGNOSIS — F32A Depression, unspecified: Secondary | ICD-10-CM

## 2015-10-29 DIAGNOSIS — E039 Hypothyroidism, unspecified: Secondary | ICD-10-CM | POA: Diagnosis not present

## 2015-10-29 DIAGNOSIS — F419 Anxiety disorder, unspecified: Secondary | ICD-10-CM

## 2015-10-29 DIAGNOSIS — G4733 Obstructive sleep apnea (adult) (pediatric): Secondary | ICD-10-CM

## 2015-10-29 DIAGNOSIS — E1122 Type 2 diabetes mellitus with diabetic chronic kidney disease: Secondary | ICD-10-CM

## 2015-10-29 DIAGNOSIS — F329 Major depressive disorder, single episode, unspecified: Secondary | ICD-10-CM

## 2015-10-29 DIAGNOSIS — R0981 Nasal congestion: Secondary | ICD-10-CM

## 2015-10-29 DIAGNOSIS — F418 Other specified anxiety disorders: Secondary | ICD-10-CM

## 2015-10-29 LAB — HEPATIC FUNCTION PANEL
ALT: 26 U/L (ref 0–53)
AST: 20 U/L (ref 0–37)
Albumin: 4.3 g/dL (ref 3.5–5.2)
Alkaline Phosphatase: 51 U/L (ref 39–117)
BILIRUBIN DIRECT: 0.1 mg/dL (ref 0.0–0.3)
BILIRUBIN TOTAL: 0.3 mg/dL (ref 0.2–1.2)
TOTAL PROTEIN: 7.7 g/dL (ref 6.0–8.3)

## 2015-10-29 LAB — LIPID PANEL
CHOLESTEROL: 179 mg/dL (ref 0–200)
HDL: 42.1 mg/dL (ref >39.00)
LDL CALC: 122 mg/dL — AB (ref 0–99)
NonHDL: 137.3
Total CHOL/HDL Ratio: 4
Triglycerides: 79 mg/dL (ref 0.0–149.0)
VLDL: 15.8 mg/dL (ref 0.0–40.0)

## 2015-10-29 LAB — BASIC METABOLIC PANEL
BUN: 14 mg/dL (ref 6–23)
CALCIUM: 9.5 mg/dL (ref 8.4–10.5)
CO2: 30 meq/L (ref 19–32)
CREATININE: 1.07 mg/dL (ref 0.40–1.50)
Chloride: 101 mEq/L (ref 96–112)
GFR: 99.51 mL/min (ref >60.00)
Glucose, Bld: 110 mg/dL — ABNORMAL HIGH (ref 70–99)
Potassium: 3.8 mEq/L (ref 3.5–5.1)
SODIUM: 138 meq/L (ref 135–145)

## 2015-10-29 LAB — VITAMIN D 25 HYDROXY (VIT D DEFICIENCY, FRACTURES): VITD: 15.98 ng/mL — ABNORMAL LOW (ref 30.00–100.00)

## 2015-10-29 LAB — TSH: TSH: 1.27 u[IU]/mL (ref 0.35–4.50)

## 2015-10-29 LAB — VITAMIN B12: Vitamin B-12: 315 pg/mL (ref 211–911)

## 2015-10-29 LAB — HEMOGLOBIN A1C: Hgb A1c MFr Bld: 7.2 % — ABNORMAL HIGH (ref 4.6–6.5)

## 2015-10-29 MED ORDER — METOPROLOL SUCCINATE ER 50 MG PO TB24: 50.0000 mg | ORAL_TABLET | Freq: Every day | ORAL | 3 refills | Status: DC

## 2015-10-29 MED ORDER — TERBINAFINE HCL 250 MG PO TABS: 250.0000 mg | ORAL_TABLET | Freq: Every day | ORAL | 0 refills | Status: DC

## 2015-10-29 NOTE — Assessment & Plan Note (Signed)
Uncontrolled, add toprol xl.

## 2015-10-29 NOTE — Assessment & Plan Note (Signed)
Has completed 2 months of lamisil. Improving. Will send 1 more month. Desires referral to podiatry for nail debridment.

## 2015-10-29 NOTE — Progress Notes (Signed)
Subjective:    Patient ID: [name], male    DOB: Aug 11, 1975, 40 y.o.   MRN: [id number]  HPI  [name] is a 40 yr old male who presents today for follow up of multiple medical problems:  1) HTN- on amlodipine, hctz, and lisinopril.   BP Readings from Last 3 Encounters:  10/29/15 (!) 160/110  06/18/15 138/90  06/04/15 (!) 153/97   2) DM2- maintained on metformin.   Lab Results  Component Value Date   HGBA1C 7.0 (H) 05/14/2015   HGBA1C 6.7 (H) 01/18/2015   HGBA1C 6.6 (H) 09/15/2014   Lab Results  Component Value Date   MICROALBUR 6.9 (H) 05/14/2015   LDLCALC 166 (H) 05/14/2015   CREATININE 1.07 06/18/2015   3) Hypothyroid- maintained on synthroid.  Lab Results  Component Value Date   TSH 1.75 05/14/2015   4) OSA- reports + fatigue.  Had to reschedule CPAP appointment. Does not currently have CPAP.    5) Nasal Congestion- notes + nasal congestion, ear fullness, sneezing.   6) anxiety/depression-maintained on citalopram. Reports mood is good, anxiety is well controlled.    Review of Systems See HPI  Past Medical History:  Diagnosis Date  . Anxiety   . Depression   . Diabetes mellitus without complication (HCC)   . Hyperlipidemia   . Hypertension   . Hypothyroidism   . Obesity   . OSA (obstructive sleep apnea)   . Sinus congestion      Social History   Social History  . Marital status: Married    Spouse name: N/A  . Number of children: N/A  . Years of education: N/A   Occupational History  . 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 Smoking status: Never Smoker  . Smokeless tobacco: Not on file  . Alcohol use No  . Drug use: No  . Sexual activity: Not on file   Other Topics Concern  . Not on file   Social History Narrative  . No narrative on file    Past Surgical History:  Procedure Laterality Date  . FRACTURE SURGERY     right arch  . HERNIA REPAIR  1990   Right ingruial & umbilical  [place]   . LEFT HEART CATHETERIZATION WITH CORONARY ANGIOGRAM N/A 09/26/2011   Procedure: LEFT HEART CATHETERIZATION WITH CORONARY ANGIOGRAM;  Surgeon: Pamella PertJagadeesh R Ganji, MD;  [facility];  Service: Cardiovascular;  Laterality: N/A;  . Repair Right Arm Fracture  1996   [place]   . UVULOPALATOPHARYNGOPLASTY (UPPP)/TONSILLECTOMY/SEPTOPLASTY      Family History  Problem Relation Age of Onset  . Hypertension Mother   . Hypertension Father   . Heart attack Father   . Down syndrome Brother   . Breast cancer Paternal Grandmother   . Diabetes Paternal Grandmother   . Kidney disease Maternal Grandfather     Allergies  Allergen Reactions  . Atorvastatin Other (See Comments)    Elevated liver enzymes  . Cymbalta [Duloxetine Hcl] Rash    Current Outpatient Prescriptions on File Prior to Visit  Medication Sig Dispense Refill  . amLODipine (NORVASC) 10 MG tablet Take 1 tablet (10 mg total) by mouth daily. 90 tablet 3  . aspirin 81 MG chewable tablet Chew 81 mg by mouth daily.    . citalopram (CELEXA) 40 MG tablet Take 1 tablet (40 mg total) by mouth daily. 30 tablet 3  . fluticasone (FLONASE) 50 MCG/ACT nasal  spray Place 1 spray into both nostrils 2 (two) times daily. 16 g 6  . hydrochlorothiazide (HYDRODIURIL) 25 MG tablet Take 1 tablet (25 mg total) by mouth daily. 30 tablet 3  . levothyroxine (SYNTHROID, LEVOTHROID) 88 MCG tablet Take 1 tablet (88 mcg total) by mouth daily. 30 tablet 5  . lisinopril (PRINIVIL,ZESTRIL) 40 MG tablet Take 1 tablet (40 mg total) by mouth daily. 30 tablet 2  . metFORMIN (GLUCOPHAGE) 500 MG tablet Take 1 tablet (500 mg total) by mouth 2 (two) times daily with a meal. 60 tablet 2  . rosuvastatin (CRESTOR) 20 MG tablet Take 1 tablet (20 mg total) by mouth daily. 90 tablet 1  . sodium chloride (OCEAN) 0.65 % nasal spray Place 1 spray into the nose as needed. Nasal dryness    . terbinafine (LAMISIL) 250 MG tablet Take 1 tablet (250 mg total) by mouth  daily. 30 tablet 1   No current facility-administered medications on file prior to visit.     BP (!) 160/110   Pulse 71   Temp 97.7 F (36.5 C) (Oral)   Resp 16   Ht 6' (1.829 m)   Wt (!) 319 lb (144.7 kg)   SpO2 98% Comment: room air  BMI 43.26 kg/m       Objective:   Physical Exam  Constitutional: He is oriented to person, place, and time. He appears well-developed and well-nourished. No distress.  HENT:  Head: Normocephalic and atraumatic.  Bilateral cerumen impaction, unable to visualize TM's.   Cardiovascular: Normal rate and regular rhythm.   No murmur heard. Pulmonary/Chest: Effort normal and breath sounds normal. No respiratory distress. He has no wheezes. He has no rales.  Musculoskeletal: He exhibits no edema.  Neurological: He is alert and oriented to person, place, and time.  Skin: Skin is warm and dry.  Onychomycosis bilateral toenails, new growth is improved.   Psychiatric: He has a normal mood and affect. His behavior is normal. Thought content normal.          Assessment & Plan:  Nasal congestion- suspect allergic rhinitis. Trial of zyrtec 10mg .

## 2015-10-29 NOTE — Assessment & Plan Note (Signed)
Check follow up vit D level.  

## 2015-10-29 NOTE — Telephone Encounter (Signed)
Lipids above goal. Has he missed any crestor? If not, we need to increase crestor to 40mg  once daily and repeat lipids in 6 weeks. Sugar also above goal. Increase metformin to bid (he was only taking once daily). Vitamin D level is low.  Advise patient to begin vit D 50000 units once weekly for 12 weeks, then repeat vit D level (dx Vit D deficiency).

## 2015-10-29 NOTE — Assessment & Plan Note (Signed)
Clinically stable on synthroid.  Obtain TSH, continue synthroid.

## 2015-10-29 NOTE — Assessment & Plan Note (Signed)
Will try to get CPAP set back up until he can get back in with pulmonary.

## 2015-10-29 NOTE — Assessment & Plan Note (Signed)
Check follow up b12.

## 2015-10-29 NOTE — Progress Notes (Signed)
Pre visit review using our clinic review tool, if applicable. No additional management support is needed unless otherwise documented below in the visit note. 

## 2015-10-29 NOTE — Assessment & Plan Note (Signed)
Currently stable on citalopram.

## 2015-10-29 NOTE — Patient Instructions (Addendum)
You will be contacted about your CPAP. Please begin toprol xl once daily for your blood pressure. Try to add in regular exercise such as walking. Add zyrtec 10mg  once daily for nasal congestion.

## 2015-10-29 NOTE — Assessment & Plan Note (Signed)
Pt reports that he is taking metformin only once daily. Advised pt to increase to bid.

## 2015-11-01 MED ORDER — VITAMIN D (ERGOCALCIFEROL) 1.25 MG (50000 UNIT) PO CAPS: 50000.0000 [IU] | ORAL_CAPSULE | ORAL | 0 refills | Status: DC

## 2015-11-01 MED ORDER — METFORMIN HCL 500 MG PO TABS: 500.0000 mg | ORAL_TABLET | Freq: Two times a day (BID) | ORAL | 3 refills | Status: DC

## 2015-11-01 MED ORDER — ROSUVASTATIN CALCIUM 40 MG PO TABS: 40.0000 mg | ORAL_TABLET | Freq: Every day | ORAL | 3 refills | Status: DC

## 2015-11-01 NOTE — Telephone Encounter (Signed)
I spoke with the pt and advised him that his Rx for Crestor 40 mg, Metformin 500 mg and the vitamin D 50,000 Units would be sent to the pharmacy. Pt voices understanding and did not have any further questions about his recent labs.

## 2015-11-05 MED FILL — TERBINAFINE HCL 250 MG TAB: 250 | 30 days supply | Qty: 30 | Fill #1

## 2015-11-05 MED FILL — METOPROLOL SUCC ER 50 MG TA: 50 | 90 days supply | Qty: 90 | Fill #0

## 2015-11-05 MED FILL — VIT D2 1.25 MG (50,000 UNIT: 1.25 MG | 84 days supply | Qty: 12 | Fill #0

## 2015-11-19 MED FILL — LISINOPRIL 40 MG TABLET: 40 | 30 days supply | Qty: 30 | Fill #1

## 2015-11-19 MED FILL — AMLODIPINE BESYLATE 10 MG T: 10 | 90 days supply | Qty: 90 | Fill #0

## 2015-11-19 MED FILL — HYDROCHLOROTHIAZIDE 25 MG T: 25 | 30 days supply | Qty: 30 | Fill #1

## 2015-11-26 ENCOUNTER — Ambulatory Visit: Payer: Self-pay | Admitting: Podiatry

## 2015-11-26 ENCOUNTER — Ambulatory Visit: Payer: 59 | Admitting: Family

## 2015-11-26 ENCOUNTER — Telehealth: Payer: Self-pay | Admitting: Family

## 2015-11-26 NOTE — Telephone Encounter (Signed)
No charge. 

## 2015-11-26 NOTE — Telephone Encounter (Signed)
Patient called stating he overslept this morning for he's 8am appointment, patient apologize and would like $50 no show fee waived. Patient RSC to 12/03/15. Charge or no charge

## 2015-12-03 ENCOUNTER — Ambulatory Visit: Payer: 59 | Admitting: Family

## 2015-12-03 DIAGNOSIS — Z0289 Encounter for other administrative examinations: Secondary | ICD-10-CM

## 2015-12-06 ENCOUNTER — Encounter: Payer: Self-pay | Admitting: Family

## 2015-12-10 ENCOUNTER — Institutional Professional Consult (permissible substitution): Payer: 59 | Admitting: Pulmonary Disease

## 2015-12-17 ENCOUNTER — Encounter: Payer: Self-pay | Admitting: Family

## 2015-12-17 ENCOUNTER — Ambulatory Visit: Payer: Self-pay | Admitting: Podiatry

## 2015-12-20 MED FILL — CITALOPRAM HBR 40 MG TABLET: 40 | 30 days supply | Qty: 30 | Fill #1

## 2015-12-20 MED FILL — metFORMIN HCL 500 MG TABS: 500 | 30 days supply | Qty: 60 | Fill #1

## 2015-12-20 MED FILL — LEVOTHYROXINE 88 MCG TABLET: 88 | 30 days supply | Qty: 30 | Fill #1

## 2015-12-24 ENCOUNTER — Telehealth: Payer: Self-pay | Admitting: Family

## 2015-12-24 NOTE — Telephone Encounter (Signed)
Patient dismissed from [facility] by Sandford CrazeMelissa O'Sullivan , effective December 17, 2015. Dismissal letter sent out by certified / registered mail. DAJ

## 2016-01-05 ENCOUNTER — Encounter: Payer: Self-pay | Admitting: Physician Assistant

## 2016-01-05 ENCOUNTER — Ambulatory Visit (INDEPENDENT_AMBULATORY_CARE_PROVIDER_SITE_OTHER): Payer: 59 | Admitting: Physician Assistant

## 2016-01-05 VITALS — BP 161/107 | HR 86 | Temp 98.6°F | Ht 72.0 in | Wt 316.0 lb

## 2016-01-05 DIAGNOSIS — B351 Tinea unguium: Secondary | ICD-10-CM | POA: Diagnosis not present

## 2016-01-05 DIAGNOSIS — E039 Hypothyroidism, unspecified: Secondary | ICD-10-CM | POA: Diagnosis not present

## 2016-01-05 DIAGNOSIS — I10 Essential (primary) hypertension: Secondary | ICD-10-CM

## 2016-01-05 DIAGNOSIS — E119 Type 2 diabetes mellitus without complications: Secondary | ICD-10-CM | POA: Diagnosis not present

## 2016-01-05 DIAGNOSIS — G4733 Obstructive sleep apnea (adult) (pediatric): Secondary | ICD-10-CM

## 2016-01-05 MED ORDER — TERBINAFINE HCL 250 MG PO TABS: 250.0000 mg | ORAL_TABLET | Freq: Every day | ORAL | 1 refills | Status: DC

## 2016-01-05 MED ORDER — VALSARTAN 160 MG PO TABS: 160.0000 mg | ORAL_TABLET | Freq: Every day | ORAL | 6 refills | Status: DC

## 2016-01-05 MED FILL — TERBINAFINE HCL 250 MG TAB: 250 | 30 days supply | Qty: 30 | Fill #0

## 2016-01-05 MED FILL — VALSARTAN 160 MG TABLET: 160 | 30 days supply | Qty: 30 | Fill #0

## 2016-01-05 NOTE — Patient Instructions (Signed)

## 2016-01-07 ENCOUNTER — Encounter: Payer: Self-pay | Admitting: Physician Assistant

## 2016-01-07 NOTE — Progress Notes (Signed)
BP (!) 161/107   Pulse 86   Temp 98.6 F (37 C) (Oral)   Ht 6' (1.829 m)   Wt (!) 316 lb (143.3 kg)   BMI 42.86 kg/m    Subjective:    Patient ID: [name], male    DOB: 20-Jul-1975, 40 y.o.   MRN: [id number]  HPI: [name] is a 40 y.o. male presenting on 01/05/2016 for Establish Care  [name] is becoming established with me at [facility].  Blood pressure has been elevated at times.  Needs tighter control.  Has also been without his CPAP, was turned back in because there was less use than required by the insurance. Will look into getting his study and order for the equipment.  All history and medications are reviewed and refilled as needed.    Past Medical History:  Diagnosis Date  . Anxiety   . Depression   . Diabetes mellitus without complication (HCC)   . Hyperlipidemia   . Hypertension   . Hypothyroidism   . Obesity   . OSA (obstructive sleep apnea)   . Sinus congestion    Relevant past medical, surgical, family and social history reviewed and updated as indicated. Interim medical history since our last visit reviewed. Allergies and medications reviewed and updated. DATA REVIEWED: CHART IN EPIC  Social History   Social History  . Marital status: Married    Spouse name: N/A  . Number of children: N/A  . Years of education: N/A   Occupational History  . 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 Smoking status: Never Smoker  . Smokeless tobacco: Not on file  . Alcohol use No  . Drug use: No  . Sexual activity: Not on file   Other Topics Concern  . Not on file   Social History Narrative  . No narrative on file    Past Surgical History:  Procedure Laterality Date  . FRACTURE SURGERY     right arch  . HERNIA REPAIR  1990   Right ingruial & umbilical [place]   . LEFT HEART CATHETERIZATION WITH CORONARY ANGIOGRAM N/A 09/26/2011   Procedure: LEFT HEART CATHETERIZATION WITH CORONARY ANGIOGRAM;  Surgeon:  Pamella Pert, MD;  Location: [facility] C[facility] LAB;  Service: Cardiovascular;  Laterality: N/A;  . Repair Right Arm Fracture  1996   [place]   . UVULOPALATOPHARYNGOPLASTY (UPPP)/TONSILLECTOMY/SEPTOPLASTY      Family History  Problem Relation Age of Onset  . Hypertension Mother   . Hypertension Father   . Heart attack Father   . Breast cancer Paternal Grandmother   . Diabetes Paternal Grandmother   . Kidney disease Maternal Grandfather   . Down syndrome Brother     Review of Systems  Constitutional: Negative.  Negative for appetite change, fatigue, fever and unexpected weight change.  HENT: Negative.   Eyes: Negative.  Negative for pain and visual disturbance.  Respiratory: Negative.  Negative for cough, chest tightness, shortness of breath and wheezing.   Cardiovascular: Negative.  Negative for chest pain, palpitations and leg swelling.  Gastrointestinal: Negative.  Negative for abdominal pain, diarrhea, nausea and vomiting.  Endocrine: Negative.   Genitourinary: Negative.   Musculoskeletal: Negative.   Skin: Negative.  Negative for color change and rash.  Neurological: Negative.  Negative for weakness, numbness and headaches.  Psychiatric/Behavioral: Negative.       Medication List       Accurate as of 01/05/16 11:59 PM. Always  use your most recent med list.          amLODipine 10 MG tablet Commonly known as:  NORVASC Take 1 tablet (10 mg total) by mouth daily.   aspirin 81 MG chewable tablet Chew 81 mg by mouth daily.   citalopram 40 MG tablet Commonly known as:  CELEXA Take 1 tablet (40 mg total) by mouth daily.   fluticasone 50 MCG/ACT nasal spray Commonly known as:  FLONASE Place 1 spray into both nostrils 2 (two) times daily.   hydrochlorothiazide 25 MG tablet Commonly known as:  HYDRODIURIL Take 1 tablet (25 mg total) by mouth daily.   levothyroxine 88 MCG tablet Commonly known as:  SYNTHROID, LEVOTHROID Take 1 tablet (88 mcg total) by mouth daily.     metFORMIN 500 MG tablet Commonly known as:  GLUCOPHAGE Take 1 tablet (500 mg total) by mouth 2 (two) times daily with a meal.   metoprolol succinate 50 MG 24 hr tablet Commonly known as:  TOPROL-XL Take 1 tablet (50 mg total) by mouth daily. Take with or immediately following a meal.   rosuvastatin 40 MG tablet Commonly known as:  CRESTOR Take 1 tablet (40 mg total) by mouth daily.   sodium chloride 0.65 % nasal spray Commonly known as:  OCEAN Place 1 spray into the nose as needed. Nasal dryness   terbinafine 250 MG tablet Commonly known as:  LAMISIL Take 1 tablet (250 mg total) by mouth daily.   valsartan 160 MG tablet Commonly known as:  DIOVAN Take 1 tablet (160 mg total) by mouth daily.   Vitamin D (Ergocalciferol) 50000 units Caps capsule Commonly known as:  DRISDOL Take 1 capsule (50,000 Units total) by mouth every 7 (seven) days.          Objective:    BP (!) 161/107   Pulse 86   Temp 98.6 F (37 C) (Oral)   Ht 6' (1.829 m)   Wt (!) 316 lb (143.3 kg)   BMI 42.86 kg/m   Allergies  Allergen Reactions  . Atorvastatin Other (See Comments)    Elevated liver enzymes  . Cymbalta [Duloxetine Hcl] Rash    Wt Readings from Last 3 Encounters:  01/05/16 (!) 316 lb (143.3 kg)  10/29/15 (!) 319 lb (144.7 kg)  06/04/15 (!) 319 lb 3.2 oz (144.8 kg)    Physical Exam  Constitutional: [name] appears well-developed and well-nourished.  HENT:  Head: Normocephalic and atraumatic.  Eyes: Conjunctivae and EOM are normal. Pupils are equal, round, and reactive to light.  Neck: Normal range of motion. Neck supple.  Cardiovascular: Normal rate, regular rhythm and normal heart sounds.   Pulmonary/Chest: Effort normal and breath sounds normal.  Abdominal: Soft. Bowel sounds are normal.  Musculoskeletal: Normal range of motion.  Skin: Skin is warm and dry.    Results for orders placed or performed in visit on 10/29/15  Basic metabolic panel  Result Value Ref Range   Sodium  138 135 - 145 mEq/L   Potassium 3.8 3.5 - 5.1 mEq/L   Chloride 101 96 - 112 mEq/L   CO2 30 19 - 32 mEq/L   Glucose, Bld 110 (H) 70 - 99 mg/dL   BUN 14 6 - 23 mg/dL   Creatinine, Ser 1.61 0.40 - 1.50 mg/dL   Calcium 9.5 8.4 - 09.6 mg/dL   GFR 04.54 >09.81 mL/min  Hemoglobin A1c  Result Value Ref Range   Hgb A1c MFr Bld 7.2 (H) 4.6 - 6.5 %  Lipid panel  Result Value Ref Range   Cholesterol 179 0 - 200 mg/dL   Triglycerides 16.179.0 0.0 - 149.0 mg/dL   HDL 09.6042.10 >45.40>39.00 mg/dL   VLDL 98.115.8 0.0 - 19.140.0 mg/dL   LDL Cholesterol 478122 (H) 0 - 99 mg/dL   Total CHOL/HDL Ratio 4    NonHDL 137.30   TSH  Result Value Ref Range   TSH 1.27 0.35 - 4.50 uIU/mL  Vitamin D (25 hydroxy)  Result Value Ref Range   VITD 15.98 (L) 30.00 - 100.00 ng/mL  B12  Result Value Ref Range   Vitamin B-12 315 211 - 911 pg/mL  Hepatic function panel  Result Value Ref Range   Total Bilirubin 0.3 0.2 - 1.2 mg/dL   Bilirubin, Direct 0.1 0.0 - 0.3 mg/dL   Alkaline Phosphatase 51 39 - 117 U/L   AST 20 0 - 37 U/L   ALT 26 0 - 53 U/L   Total Protein 7.7 6.0 - 8.3 g/dL   Albumin 4.3 3.5 - 5.2 g/dL      Assessment & Plan:   1. Essential hypertension - valsartan (DIOVAN) 160 MG tablet; Take 1 tablet (160 mg total) by mouth daily.  Dispense: 30 tablet; Refill: 6 Stop lisinopril  2. Type 2 diabetes mellitus without complication, without long-term current use of insulin (HCC)  3. OSA (obstructive sleep apnea) Needs order sent to [facility], need to get his study.  4. Hypothyroidism, unspecified type Synthroid 88 mcg  5. Onychomycosis - terbinafine (LAMISIL) 250 MG tablet; Take 1 tablet (250 mg total) by mouth daily.  Dispense: 30 tablet; Refill: 1 Recheck LFT 1 month.  Continue all other maintenance medications as listed above.  Follow up plan: Return in about 4 weeks (around 02/02/2016).  Educational handout given for carb counting  Remus LofflerAngel S. Aracelly Tencza PA-C [facility] [address]  [place], [place] [phone]   01/07/2016, 8:19 AM

## 2016-01-11 DIAGNOSIS — L03032 Cellulitis of left toe: Secondary | ICD-10-CM | POA: Diagnosis not present

## 2016-01-12 MED FILL — MUPIROCIN 2% OINTMENT: 2 | 20 days supply | Qty: 22 | Fill #0

## 2016-01-13 NOTE — Telephone Encounter (Signed)
Received signed domestic return receipt verifying delivery of certified letter on December 30, 2015. Article number [id number] DAJ

## 2016-01-28 ENCOUNTER — Other Ambulatory Visit: Payer: 59

## 2016-02-08 ENCOUNTER — Encounter: Payer: Self-pay | Admitting: Physician Assistant

## 2016-02-08 ENCOUNTER — Ambulatory Visit (INDEPENDENT_AMBULATORY_CARE_PROVIDER_SITE_OTHER): Payer: 59 | Admitting: Physician Assistant

## 2016-02-08 VITALS — BP 135/86 | HR 91 | Temp 97.5°F | Ht 72.0 in | Wt 316.2 lb

## 2016-02-08 DIAGNOSIS — I10 Essential (primary) hypertension: Secondary | ICD-10-CM | POA: Diagnosis not present

## 2016-02-08 DIAGNOSIS — E119 Type 2 diabetes mellitus without complications: Secondary | ICD-10-CM | POA: Diagnosis not present

## 2016-02-08 DIAGNOSIS — G4733 Obstructive sleep apnea (adult) (pediatric): Secondary | ICD-10-CM

## 2016-02-08 LAB — BAYER DCA HB A1C WAIVED: HB A1C: 6.9 % (ref <7.0)

## 2016-02-08 NOTE — Patient Instructions (Addendum)
Over the counter VIT D3 1000 or 5000 IU one daily Vitamin D Deficiency Vitamin D deficiency is when your body does not have enough vitamin D. Vitamin D is important to your body for many reasons:  It helps the body to absorb two important minerals, called calcium and phosphorus.  It plays a role in bone health.  It may help to prevent some diseases, such as diabetes and multiple sclerosis.  It plays a role in muscle function, including heart function. You can get vitamin D by:  Eating foods that naturally contain vitamin D.  Eating or drinking milk or other dairy products that have vitamin D added to them.  Taking a vitamin D supplement or a multivitamin supplement that contains vitamin D.  Being in the sun. Your body naturally makes vitamin D when your skin is exposed to sunlight. Your body changes the sunlight into a form of the vitamin that the body can use. If vitamin D deficiency is severe, it can cause a condition in which your bones become soft. In adults, this condition is called osteomalacia. In children, this condition is called rickets. What are the causes? Vitamin D deficiency may be caused by:  Not eating enough foods that contain vitamin D.  Not getting enough sun exposure.  Having certain digestive system diseases that make it difficult for your body to absorb vitamin D. These diseases include Crohn disease, chronic pancreatitis, and cystic fibrosis.  Having a surgery in which a part of the stomach or a part of the small intestine is removed.  Being obese.  Having chronic kidney disease or liver disease. What increases the risk? This condition is more likely to develop in:  Older people.  People who do not spend much time outdoors.  People who live in a long-term care facility.  People who have had broken bones.  People with weak or thin bones (osteoporosis).  People who have a disease or condition that changes how the body absorbs vitamin D.  People  who have dark skin.  People who take certain medicines, such as steroid medicines or certain seizure medicines.  People who are overweight or obese. What are the signs or symptoms? In mild cases of vitamin D deficiency, there may not be any symptoms. If the condition is severe, symptoms may include:  Bone pain.  Muscle pain.  Falling often.  Broken bones caused by a minor injury. How is this diagnosed? This condition is usually diagnosed with a blood test. How is this treated? Treatment for this condition may depend on what caused the condition. Treatment options include:  Taking vitamin D supplements.  Taking a calcium supplement. Your health care provider will suggest what dose is best for you. Follow these instructions at home:  Take medicines and supplements only as told by your health care provider.  Eat foods that contain vitamin D. Choices include:  Fortified dairy products, cereals, or juices. Fortified means that vitamin D has been added to the food. Check the label on the package to be sure.  Fatty fish, such as salmon or trout.  Eggs.  Oysters.  Do not use a tanning bed.  Maintain a healthy weight. Lose weight, if needed.  Keep all follow-up visits as told by your health care provider. This is important. Contact a health care provider if:  Your symptoms do not go away.  You feel like throwing up (nausea) or you throw up (vomit).  You have fewer bowel movements than usual or it is difficult  for you to have a bowel movement (constipation). This information is not intended to replace advice given to you by your health care provider. Make sure you discuss any questions you have with your health care provider. Document Released: 05/22/2011 Document Revised: 08/11/2015 Document Reviewed: 07/15/2014 Elsevier Interactive Patient Education  2017 [employer]

## 2016-02-09 NOTE — Progress Notes (Signed)
BP 135/86   Pulse 91   Temp 97.5 F (36.4 C) (Oral)   Ht 6' (1.829 m)   Wt (!) 316 lb 3.2 oz (143.4 kg)   BMI 42.88 kg/m    Subjective:    Patient ID: [name], male    DOB: 1975/12/14, 40 y.o.   MRN: [id number]  HPI: [name] is a 40 y.o. male presenting on 02/08/2016 for Follow-up  The patient comes in for recheck on his blood pressure. He is doing very well with his medications. Blood pressure is greatly improved. He still working on losing weight. We reviewed all of his medications today. We also need to send in a order for his CPAP. We do have his sleep study that was found in Epic. It can be sent along with the order. It has been 3 months since he had his last A1c. We'll plan to do that today.  Relevant past medical, surgical, family and social history reviewed and updated as indicated. Allergies and medications reviewed and updated.  Past Medical History:  Diagnosis Date  . Anxiety   . Depression   . Diabetes mellitus without complication (HCC)   . Hyperlipidemia   . Hypertension   . Hypothyroidism   . Obesity   . OSA (obstructive sleep apnea)   . Sinus congestion     Past Surgical History:  Procedure Laterality Date  . FRACTURE SURGERY     right arch  . HERNIA REPAIR  1990   Right ingruial & umbilical [place]   . LEFT HEART CATHETERIZATION WITH CORONARY ANGIOGRAM N/A 09/26/2011   Procedure: LEFT HEART CATHETERIZATION WITH CORONARY ANGIOGRAM;  Surgeon: Pamella Pert, MD;  Location: [facility] CATH LAB;  Service: Cardiovascular;  Laterality: N/A;  . Repair Right Arm Fracture  1996   [place]   . UVULOPALATOPHARYNGOPLASTY (UPPP)/TONSILLECTOMY/SEPTOPLASTY      Review of Systems  Constitutional: Negative.  Negative for appetite change and fatigue.  HENT: Negative.   Eyes: Negative.  Negative for pain and visual disturbance.  Respiratory: Negative.  Negative for cough, chest tightness, shortness of breath and wheezing.   Cardiovascular: Negative.   Negative for chest pain, palpitations and leg swelling.  Gastrointestinal: Negative.  Negative for abdominal pain, diarrhea, nausea and vomiting.  Endocrine: Negative.   Genitourinary: Negative.   Musculoskeletal: Negative.   Skin: Negative.  Negative for color change and rash.  Neurological: Negative.  Negative for weakness, numbness and headaches.  Psychiatric/Behavioral: Negative.       Medication List       Accurate as of 02/08/16 11:59 PM. Always use your most recent med list.          amLODipine 10 MG tablet Commonly known as:  NORVASC Take 1 tablet (10 mg total) by mouth daily.   aspirin 81 MG chewable tablet Chew 81 mg by mouth daily.   citalopram 40 MG tablet Commonly known as:  CELEXA Take 1 tablet (40 mg total) by mouth daily.   fluticasone 50 MCG/ACT nasal spray Commonly known as:  FLONASE Place 1 spray into both nostrils 2 (two) times daily.   hydrochlorothiazide 25 MG tablet Commonly known as:  HYDRODIURIL Take 1 tablet (25 mg total) by mouth daily.   levothyroxine 88 MCG tablet Commonly known as:  SYNTHROID, LEVOTHROID Take 1 tablet (88 mcg total) by mouth daily.   metFORMIN 500 MG tablet Commonly known as:  GLUCOPHAGE Take 1 tablet (500 mg total) by mouth 2 (two) times daily with a meal.  metoprolol succinate 50 MG 24 hr tablet Commonly known as:  TOPROL-XL Take 1 tablet (50 mg total) by mouth daily. Take with or immediately following a meal.   rosuvastatin 40 MG tablet Commonly known as:  CRESTOR Take 1 tablet (40 mg total) by mouth daily.   sodium chloride 0.65 % nasal spray Commonly known as:  OCEAN Place 1 spray into the nose as needed. Nasal dryness   terbinafine 250 MG tablet Commonly known as:  LAMISIL Take 1 tablet (250 mg total) by mouth daily.   valsartan 160 MG tablet Commonly known as:  DIOVAN Take 1 tablet (160 mg total) by mouth daily.   Vitamin D (Ergocalciferol) 50000 units Caps capsule Commonly known as:   DRISDOL Take 1 capsule (50,000 Units total) by mouth every 7 (seven) days.            Durable Medical Equipment        Start     Ordered   02/09/16 0000  For home use only DME continuous positive airway pressure (CPAP)    Question Answer Comment  Patient has OSA or probable OSA Yes   Is the patient currently using CPAP in the home No   Settings 11-15   Signs and symptoms of probable OSA  (select all that apply) Snoring   CPAP supplies needed Mask, headgear, cushions, filters, heated tubing and water chamber      02/09/16 0954         Objective:    BP 135/86   Pulse 91   Temp 97.5 F (36.4 C) (Oral)   Ht 6' (1.829 m)   Wt (!) 316 lb 3.2 oz (143.4 kg)   BMI 42.88 kg/m   Allergies  Allergen Reactions  . Atorvastatin Other (See Comments)    Elevated liver enzymes  . Cymbalta [Duloxetine Hcl] Rash    Physical Exam  Constitutional: He appears well-developed and well-nourished. No distress.  HENT:  Head: Normocephalic and atraumatic.  Eyes: Conjunctivae and EOM are normal. Pupils are equal, round, and reactive to light.  Cardiovascular: Normal rate, regular rhythm and normal heart sounds.   Pulmonary/Chest: Effort normal and breath sounds normal. No respiratory distress.  Skin: Skin is warm and dry.  Psychiatric: He has a normal mood and affect. His behavior is normal.  Nursing note and vitals reviewed.   Results for orders placed or performed in visit on 02/08/16  Bayer DCA Hb A1c Waived  Result Value Ref Range   Bayer DCA Hb A1c Waived 6.9 <7.0 %      Assessment & Plan:   1. Essential hypertension Amlodipine 10 mg 1 daily Diovan 160 mg 1 daily Hydrochlorothiazide 25 mg 1 daily  2. Type 2 diabetes mellitus without complication, without long-term current use of insulin (HCC) Metformin 500 mg - Bayer DCA Hb A1c Waived  3. OSA (obstructive sleep apnea) - For home use only DME continuous positive airway pressure (CPAP)   Continue all other maintenance  medications as listed above.  Follow up plan: Return in about 3 months (around 05/10/2016) for recheck.  Orders Placed This Encounter  Procedures  . For home use only DME continuous positive airway pressure (CPAP)  . [facility] Hb A1c [employer] [facility] SolutionsWaived    Educational handout given for Vitamin D information  Remus LofflerAngel S. Shataria Crist PA-C [facility] [address]  [place], [place] [address] [phone]   02/09/2016, 11:17 AM

## 2016-02-14 MED FILL — VALSARTAN 160 MG TABLET: 160 | 30 days supply | Qty: 30 | Fill #1

## 2016-02-14 MED FILL — ROSUVASTATIN CALCIUM 20 MG: 20 | 60 days supply | Qty: 60 | Fill #1

## 2016-02-24 DIAGNOSIS — L03031 Cellulitis of right toe: Secondary | ICD-10-CM | POA: Diagnosis not present

## 2016-03-17 MED FILL — AMLODIPINE BESYLATE 10 MG T: 10 | 90 days supply | Qty: 90 | Fill #1

## 2016-04-07 MED FILL — metFORMIN HCL 500 MG TABS: 500 | 30 days supply | Qty: 60 | Fill #0

## 2016-04-07 MED FILL — VALSARTAN 160 MG TABLET: 160 | 30 days supply | Qty: 30 | Fill #2

## 2016-04-07 MED FILL — CITALOPRAM HBR 40 MG TABLET: 40 | 30 days supply | Qty: 30 | Fill #2

## 2016-04-25 ENCOUNTER — Other Ambulatory Visit: Payer: Self-pay

## 2016-04-25 MED ORDER — LEVOTHYROXINE SODIUM 88 MCG PO TABS: 88.0000 ug | ORAL_TABLET | Freq: Every day | ORAL | 0 refills | Status: DC

## 2016-04-25 MED FILL — LEVOTHYROXINE 88 MCG TABLET: 88 | 90 days supply | Qty: 90 | Fill #0

## 2016-05-24 ENCOUNTER — Telehealth: Payer: Self-pay | Admitting: Physician Assistant

## 2016-05-24 NOTE — Telephone Encounter (Signed)
Pt has several question to ask angel... He needs his c-pap machine and supplies.. And needs to get intouch with ent

## 2016-06-23 NOTE — Telephone Encounter (Signed)
Left detailed message stating we have made attempts to contact him and if he is still needing assistance or has any questions to please call us back. Will close encounter.

## 2016-06-29 ENCOUNTER — Other Ambulatory Visit: Payer: Self-pay | Admitting: Internal Medicine

## 2016-06-29 MED FILL — VALSARTAN 160 MG TABLET: 160 | 30 days supply | Qty: 30 | Fill #3

## 2016-06-30 ENCOUNTER — Other Ambulatory Visit: Payer: Self-pay | Admitting: Internal Medicine

## 2016-07-05 ENCOUNTER — Other Ambulatory Visit: Payer: Self-pay | Admitting: *Deleted

## 2016-07-05 MED ORDER — ROSUVASTATIN CALCIUM 40 MG PO TABS: 40.0000 mg | ORAL_TABLET | Freq: Every day | ORAL | 3 refills | Status: DC

## 2016-07-17 MED FILL — ROSUVASTATIN CALCIUM 40 MG: 40 | 30 days supply | Qty: 30 | Fill #0

## 2016-08-15 ENCOUNTER — Ambulatory Visit (INDEPENDENT_AMBULATORY_CARE_PROVIDER_SITE_OTHER): Payer: 59 | Admitting: Physician Assistant

## 2016-08-15 ENCOUNTER — Encounter: Payer: Self-pay | Admitting: Physician Assistant

## 2016-08-15 VITALS — BP 148/104 | HR 91 | Temp 98.5°F | Ht 72.0 in | Wt 309.6 lb

## 2016-08-15 DIAGNOSIS — J0100 Acute maxillary sinusitis, unspecified: Secondary | ICD-10-CM | POA: Diagnosis not present

## 2016-08-15 DIAGNOSIS — J3089 Other allergic rhinitis: Secondary | ICD-10-CM

## 2016-08-15 MED ORDER — FLUTICASONE PROPIONATE 50 MCG/ACT NA SUSP: 1.0000 | Freq: Two times a day (BID) | NASAL | 6 refills | Status: DC

## 2016-08-15 MED ORDER — PREDNISONE 10 MG (21) PO TBPK: ORAL_TABLET | ORAL | 0 refills | Status: DC

## 2016-08-15 MED ORDER — AMOXICILLIN 500 MG PO CAPS: 1000.0000 mg | ORAL_CAPSULE | Freq: Two times a day (BID) | ORAL | 0 refills | Status: DC

## 2016-08-15 MED FILL — AMOXICILLIN 500 MG CAPSULE: 500 | 10 days supply | Qty: 40 | Fill #0

## 2016-08-15 MED FILL — FLUTICASONE PROP 50 MCG SPR: 50 | 30 days supply | Qty: 16 | Fill #0

## 2016-08-15 MED FILL — predniSONE 10 MG (21) TBPK: 10 | 6 days supply | Qty: 21 | Fill #0

## 2016-08-15 NOTE — Patient Instructions (Signed)

## 2016-08-15 NOTE — Progress Notes (Signed)
BP (!) 148/104   Pulse 91   Temp 98.5 F (36.9 C) (Oral)   Ht 6' (1.829 m)   Wt (!) 309 lb 9.6 oz (140.4 kg)   BMI 41.99 kg/m    Subjective:    Patient ID: [name], male    DOB: 1975/06/27, 41 y.o.   MRN: [id number]  HPI: [name] is a 41 y.o. male presenting on 08/15/2016 for Sinus Problem  This patient has had many days of sinus headache and postnasal drainage. There is copious drainage at times. Denies any fever at this time. There has been a history of sinus infections in the past.  No history of sinus surgery. There is cough at night. It has become more prevalent in recent days.   All medications are reviewed today. There are no reports of any problems with the medications. All of the medical conditions are reviewed and updated.  Lab work is reviewed and will be ordered as medically necessary. There are no new problems reported with today's visit.   Relevant past medical, surgical, family and social history reviewed and updated as indicated. Allergies and medications reviewed and updated.  Past Medical History:  Diagnosis Date  . Anxiety   . Depression   . Diabetes mellitus without complication (HCC)   . Hyperlipidemia   . Hypertension   . Hypothyroidism   . Obesity   . OSA (obstructive sleep apnea)   . Sinus congestion     Past Surgical History:  Procedure Laterality Date  . FRACTURE SURGERY     right arch  . HERNIA REPAIR  1990   Right ingruial & umbilical [place]   . LEFT HEART CATHETERIZATION WITH CORONARY ANGIOGRAM N/A 09/26/2011   Procedure: LEFT HEART CATHETERIZATION WITH CORONARY ANGIOGRAM;  Surgeon: Pamella Pert, MD;  Location: [facility] CATH LAB;  Service: Cardiovascular;  Laterality: N/A;  . Repair Right Arm Fracture  1996   [place]   . UVULOPALATOPHARYNGOPLASTY (UPPP)/TONSILLECTOMY/SEPTOPLASTY      Review of Systems  Constitutional: Positive for fatigue. Negative for appetite change.  HENT: Positive for sinus pressure and sore  throat.   Eyes: Negative.  Negative for pain and visual disturbance.  Respiratory: Positive for shortness of breath and wheezing. Negative for cough and chest tightness.   Cardiovascular: Negative.  Negative for chest pain, palpitations and leg swelling.  Gastrointestinal: Negative.  Negative for abdominal pain, diarrhea, nausea and vomiting.  Endocrine: Negative.   Genitourinary: Negative.   Musculoskeletal: Positive for back pain and myalgias.  Skin: Negative.  Negative for color change and rash.  Neurological: Positive for headaches. Negative for weakness and numbness.  Psychiatric/Behavioral: Negative.     Allergies as of 08/15/2016      Reactions   Atorvastatin Other (See Comments)   Elevated liver enzymes   Cymbalta [duloxetine Hcl] Rash      Medication List       Accurate as of 08/15/16  6:36 PM. Always use your most recent med list.          amLODipine 10 MG tablet Commonly known as:  NORVASC Take 1 tablet (10 mg total) by mouth daily.   amoxicillin 500 MG capsule Commonly known as:  AMOXIL Take 2 capsules (1,000 mg total) by mouth 2 (two) times daily.   aspirin 81 MG chewable tablet Chew 81 mg by mouth daily.   citalopram 40 MG tablet Commonly known as:  CELEXA Take 1 tablet (40 mg total) by mouth daily.   fluticasone 50 MCG/ACT  nasal spray Commonly known as:  FLONASE Place 1 spray into both nostrils 2 (two) times daily.   hydrochlorothiazide 25 MG tablet Commonly known as:  HYDRODIURIL Take 1 tablet (25 mg total) by mouth daily.   levothyroxine 88 MCG tablet Commonly known as:  SYNTHROID, LEVOTHROID Take 1 tablet (88 mcg total) by mouth daily.   metFORMIN 500 MG tablet Commonly known as:  GLUCOPHAGE Take 1 tablet (500 mg total) by mouth 2 (two) times daily with a meal.   predniSONE 10 MG (21) Tbpk tablet Commonly known as:  STERAPRED UNI-PAK 21 TAB As directed x 6 days   rosuvastatin 40 MG tablet Commonly known as:  CRESTOR Take 1 tablet (40 mg  total) by mouth daily.   sodium chloride 0.65 % nasal spray Commonly known as:  OCEAN Place 1 spray into the nose as needed. Nasal dryness   valsartan 160 MG tablet Commonly known as:  DIOVAN Take 1 tablet (160 mg total) by mouth daily.          Objective:    BP (!) 148/104   Pulse 91   Temp 98.5 F (36.9 C) (Oral)   Ht 6' (1.829 m)   Wt (!) 309 lb 9.6 oz (140.4 kg)   BMI 41.99 kg/m   Allergies  Allergen Reactions  . Atorvastatin Other (See Comments)    Elevated liver enzymes  . Cymbalta [Duloxetine Hcl] Rash    Physical Exam  Constitutional: He is oriented to person, place, and time. He appears well-developed and well-nourished.  HENT:  Head: Normocephalic and atraumatic.  Right Ear: Tympanic membrane and external ear normal. No middle ear effusion.  Left Ear: Tympanic membrane and external ear normal.  No middle ear effusion.  Nose: Mucosal edema and rhinorrhea present. Right sinus exhibits no maxillary sinus tenderness. Left sinus exhibits no maxillary sinus tenderness.  Mouth/Throat: Uvula is midline. Posterior oropharyngeal erythema present.  Eyes: Conjunctivae and EOM are normal. Pupils are equal, round, and reactive to light. Right eye exhibits no discharge. Left eye exhibits no discharge.  Neck: Normal range of motion.  Cardiovascular: Normal rate, regular rhythm and normal heart sounds.   Pulmonary/Chest: Effort normal and breath sounds normal. No respiratory distress. He has no wheezes.  Abdominal: Soft.  Lymphadenopathy:    He has no cervical adenopathy.  Neurological: He is alert and oriented to person, place, and time.  Skin: Skin is warm and dry.  Psychiatric: He has a normal mood and affect.        Assessment & Plan:   1. Acute non-recurrent maxillary sinusitis - amoxicillin (AMOXIL) 500 MG capsule; Take 2 capsules (1,000 mg total) by mouth 2 (two) times daily.  Dispense: 40 capsule; Refill: 0 - predniSONE (STERAPRED UNI-PAK 21 TAB) 10 MG (21)  TBPK tablet; As directed x 6 days  Dispense: 21 tablet; Refill: 0 - fluticasone (FLONASE) 50 MCG/ACT nasal spray; Place 1 spray into both nostrils 2 (two) times daily.  Dispense: 16 g; Refill: 6  2. Other allergic rhinitis - fluticasone (FLONASE) 50 MCG/ACT nasal spray; Place 1 spray into both nostrils 2 (two) times daily.  Dispense: 16 g; Refill: 6   Current Outpatient Prescriptions:  .  amLODipine (NORVASC) 10 MG tablet, Take 1 tablet (10 mg total) by mouth daily., Disp: 90 tablet, Rfl: 3 .  aspirin 81 MG chewable tablet, Chew 81 mg by mouth daily., Disp: , Rfl:  .  citalopram (CELEXA) 40 MG tablet, Take 1 tablet (40 mg total) by mouth daily., Disp:  30 tablet, Rfl: 3 .  hydrochlorothiazide (HYDRODIURIL) 25 MG tablet, Take 1 tablet (25 mg total) by mouth daily., Disp: 30 tablet, Rfl: 3 .  levothyroxine (SYNTHROID, LEVOTHROID) 88 MCG tablet, Take 1 tablet (88 mcg total) by mouth daily., Disp: 90 tablet, Rfl: 0 .  metFORMIN (GLUCOPHAGE) 500 MG tablet, Take 1 tablet (500 mg total) by mouth 2 (two) times daily with a meal., Disp: 60 tablet, Rfl: 3 .  rosuvastatin (CRESTOR) 40 MG tablet, Take 1 tablet (40 mg total) by mouth daily., Disp: 30 tablet, Rfl: 3 .  sodium chloride (OCEAN) 0.65 % nasal spray, Place 1 spray into the nose as needed. Nasal dryness, Disp: , Rfl:  .  valsartan (DIOVAN) 160 MG tablet, Take 1 tablet (160 mg total) by mouth daily., Disp: 30 tablet, Rfl: 6 .  amoxicillin (AMOXIL) 500 MG capsule, Take 2 capsules (1,000 mg total) by mouth 2 (two) times daily., Disp: 40 capsule, Rfl: 0 .  fluticasone (FLONASE) 50 MCG/ACT nasal spray, Place 1 spray into both nostrils 2 (two) times daily., Disp: 16 g, Rfl: 6 .  predniSONE (STERAPRED UNI-PAK 21 TAB) 10 MG (21) TBPK tablet, As directed x 6 days, Disp: 21 tablet, Rfl: 0  Continue all other maintenance medications as listed above.  Follow up plan: Return if symptoms worsen or fail to improve.  Educational handout given for  sinusitis  Remus Loffler PA-C [facility] [address]  [place], [place] [address] [phone]   08/15/2016, 6:36 PM

## 2016-08-29 ENCOUNTER — Other Ambulatory Visit: Payer: Self-pay

## 2016-08-30 ENCOUNTER — Telehealth: Payer: Self-pay | Admitting: Physician Assistant

## 2016-08-30 ENCOUNTER — Other Ambulatory Visit: Payer: Self-pay | Admitting: Family

## 2016-08-30 ENCOUNTER — Other Ambulatory Visit: Payer: Self-pay | Admitting: Internal Medicine

## 2016-08-30 DIAGNOSIS — I10 Essential (primary) hypertension: Secondary | ICD-10-CM

## 2016-08-30 MED ORDER — CITALOPRAM HYDROBROMIDE 40 MG PO TABS: 40.0000 mg | ORAL_TABLET | Freq: Every day | ORAL | 0 refills | Status: DC

## 2016-08-30 MED ORDER — AMLODIPINE BESYLATE 10 MG PO TABS: 10.0000 mg | ORAL_TABLET | Freq: Every day | ORAL | 0 refills | Status: DC

## 2016-08-30 MED ORDER — VALSARTAN 160 MG PO TABS: 160.0000 mg | ORAL_TABLET | Freq: Every day | ORAL | 0 refills | Status: DC

## 2016-08-30 MED FILL — AMLODIPINE BESYLATE 10 MG T: 10 | 90 days supply | Qty: 90 | Fill #0

## 2016-08-30 MED FILL — VALSARTAN 160 MG TABLET: 160 | 30 days supply | Qty: 30 | Fill #4

## 2016-08-30 MED FILL — CITALOPRAM HBR 40 MG TABLET: 40 | 90 days supply | Qty: 90 | Fill #0

## 2016-08-30 NOTE — Telephone Encounter (Signed)
Rx sent to [place]. 

## 2016-09-01 ENCOUNTER — Other Ambulatory Visit: Payer: Self-pay | Admitting: *Deleted

## 2016-09-01 MED ORDER — HYDROCHLOROTHIAZIDE 25 MG PO TABS: 25.0000 mg | ORAL_TABLET | Freq: Every day | ORAL | 0 refills | Status: DC

## 2016-09-05 MED ORDER — HYDROCHLOROTHIAZIDE 25 MG PO TABS: 25.0000 mg | ORAL_TABLET | Freq: Every day | ORAL | 0 refills | Status: DC

## 2016-09-05 NOTE — Telephone Encounter (Signed)
[facility] in [facility] Rx to this pharmacy since others from 6/20 were sent there

## 2016-09-05 NOTE — Addendum Note (Signed)
Addended by: Julious PayerHOLT, Ayshia Gramlich D on: 09/05/2016 03:17 PM   Modules accepted: Orders

## 2016-09-15 MED FILL — HYDROCHLOROTHIAZIDE 25 MG T: 25 | 90 days supply | Qty: 90 | Fill #0

## 2016-09-25 ENCOUNTER — Telehealth: Payer: Self-pay | Admitting: Physician Assistant

## 2016-09-25 ENCOUNTER — Emergency Department (HOSPITAL_COMMUNITY): Payer: 59

## 2016-09-25 ENCOUNTER — Emergency Department (HOSPITAL_COMMUNITY)
Admission: EM | Admit: 2016-09-25 | Discharge: 2016-09-25 | Disposition: A | Discharge status: Home / Self Care | Payer: 59 | Attending: Emergency Medicine | Admitting: Emergency Medicine

## 2016-09-25 ENCOUNTER — Encounter (HOSPITAL_COMMUNITY): Payer: Self-pay

## 2016-09-25 DIAGNOSIS — Z79899 Other long term (current) drug therapy: Secondary | ICD-10-CM | POA: Diagnosis not present

## 2016-09-25 DIAGNOSIS — R509 Fever, unspecified: Secondary | ICD-10-CM

## 2016-09-25 DIAGNOSIS — Z7982 Long term (current) use of aspirin: Secondary | ICD-10-CM | POA: Insufficient documentation

## 2016-09-25 DIAGNOSIS — E039 Hypothyroidism, unspecified: Secondary | ICD-10-CM | POA: Diagnosis not present

## 2016-09-25 DIAGNOSIS — Z7984 Long term (current) use of oral hypoglycemic drugs: Secondary | ICD-10-CM | POA: Insufficient documentation

## 2016-09-25 DIAGNOSIS — I10 Essential (primary) hypertension: Secondary | ICD-10-CM | POA: Insufficient documentation

## 2016-09-25 DIAGNOSIS — N3091 Cystitis, unspecified with hematuria: Secondary | ICD-10-CM | POA: Diagnosis not present

## 2016-09-25 DIAGNOSIS — R319 Hematuria, unspecified: Secondary | ICD-10-CM | POA: Diagnosis not present

## 2016-09-25 DIAGNOSIS — N3001 Acute cystitis with hematuria: Secondary | ICD-10-CM

## 2016-09-25 DIAGNOSIS — E119 Type 2 diabetes mellitus without complications: Secondary | ICD-10-CM | POA: Diagnosis not present

## 2016-09-25 LAB — COMPREHENSIVE METABOLIC PANEL
ALT: 26 U/L (ref 17–63)
AST: 19 U/L (ref 15–41)
Albumin: 3.9 g/dL (ref 3.5–5.0)
Alkaline Phosphatase: 48 U/L (ref 38–126)
Anion gap: 9 (ref 5–15)
BILIRUBIN TOTAL: 0.7 mg/dL (ref 0.3–1.2)
BUN: 12 mg/dL (ref 6–20)
CO2: 30 mmol/L (ref 22–32)
Calcium: 9.2 mg/dL (ref 8.9–10.3)
Chloride: 97 mmol/L — ABNORMAL LOW (ref 101–111)
Creatinine, Ser: 1.23 mg/dL (ref 0.61–1.24)
GFR calc Af Amer: >60 mL/min (ref >60)
Glucose, Bld: 107 mg/dL — ABNORMAL HIGH (ref 65–99)
POTASSIUM: 3.7 mmol/L (ref 3.5–5.1)
Sodium: 136 mmol/L (ref 135–145)
TOTAL PROTEIN: 8.3 g/dL — AB (ref 6.5–8.1)

## 2016-09-25 LAB — CBC WITH DIFFERENTIAL/PLATELET
BASOS ABS: 0 10*3/uL (ref 0.0–0.1)
Basophils Relative: 0 %
EOS ABS: 0.1 10*3/uL (ref 0.0–0.7)
EOS PCT: 1 %
HCT: 43.7 % (ref 39.0–52.0)
Hemoglobin: 14.6 g/dL (ref 13.0–17.0)
LYMPHS PCT: 23 %
Lymphs Abs: 3 10*3/uL (ref 0.7–4.0)
MCH: 27 pg (ref 26.0–34.0)
MCHC: 33.4 g/dL (ref 30.0–36.0)
MCV: 80.9 fL (ref 78.0–100.0)
Monocytes Absolute: 1.1 10*3/uL — ABNORMAL HIGH (ref 0.1–1.0)
Monocytes Relative: 9 %
Neutro Abs: 8.6 10*3/uL — ABNORMAL HIGH (ref 1.7–7.7)
Neutrophils Relative %: 67 %
PLATELETS: 290 10*3/uL (ref 150–400)
RBC: 5.4 MIL/uL (ref 4.22–5.81)
RDW: 14.9 % (ref 11.5–15.5)
WBC: 12.8 10*3/uL — AB (ref 4.0–10.5)

## 2016-09-25 LAB — URINALYSIS, ROUTINE W REFLEX MICROSCOPIC
Glucose, UA: NEGATIVE mg/dL
Ketones, ur: NEGATIVE mg/dL
NITRITE: POSITIVE — AB
PROTEIN: 100 mg/dL — AB
Specific Gravity, Urine: >1.03 — ABNORMAL HIGH (ref 1.005–1.030)
pH: 5 (ref 5.0–8.0)

## 2016-09-25 LAB — URINALYSIS, MICROSCOPIC (REFLEX)

## 2016-09-25 LAB — PROTIME-INR
INR: 0.98
PROTHROMBIN TIME: 13 s (ref 11.4–15.2)

## 2016-09-25 MED ORDER — ACETAMINOPHEN 325 MG PO TABS
650.0000 mg | ORAL_TABLET | Freq: Once | ORAL | Status: AC
  Administered 2016-09-25: 650 mg via ORAL
  Filled 2016-09-25: qty 2

## 2016-09-25 MED ORDER — CEPHALEXIN 500 MG PO CAPS: 500.0000 mg | ORAL_CAPSULE | Freq: Four times a day (QID) | ORAL | 0 refills | Status: DC

## 2016-09-25 MED ORDER — PROMETHAZINE HCL 25 MG PO TABS: 25.0000 mg | ORAL_TABLET | Freq: Four times a day (QID) | ORAL | 1 refills | Status: DC | PRN

## 2016-09-25 MED ORDER — DEXTROSE 5 % IV SOLN
2.0000 g | Freq: Once | INTRAVENOUS | Status: AC
  Administered 2016-09-25: 2 g via INTRAVENOUS
  Filled 2016-09-25: qty 2

## 2016-09-25 NOTE — ED Triage Notes (Signed)
Reports of bilateral flank pain that radiates to penis. Also reports of hematuria and fever. Temp 101.

## 2016-09-25 NOTE — Telephone Encounter (Signed)
Also some nausea and some congestion / slight cough  Pt states he does feel some better this morning - he is aware to ask the pharmacist to help with a HBP cough syrup and to try PLAIN mucinex otc - NO D  He will push fluids and call tomorrow morning if turns for the worse.

## 2016-09-25 NOTE — ED Provider Notes (Signed)
[facility]-EMERGENCY DEPT Provider Note   CSN: [id number] Arrival date & time: 09/25/16  1805     History   Chief Complaint Chief Complaint  Patient presents with  . Fever  . Hematuria    HPI [name] is a 41 y.o. male.  Patient presenting with a complaint of bilateral CVA pain. And dysuria and hematuria and fever. Temps up to 101. Right ear with a temporal 101. Symptoms have been ongoing since Saturday associated with chills. Some bodyaches. Some nausea one loose bowel movement. No history of anything similar no history of any kidney stones.      Past Medical History:  Diagnosis Date  . Anxiety   . Depression   . Diabetes mellitus without complication (HCC)   . Hyperlipidemia   . Hypertension   . Hypothyroidism   . Obesity   . OSA (obstructive sleep apnea)   . Sinus congestion     Patient Active Problem List   Diagnosis Date Noted  . Essential hypertension 01/05/2016  . Vitamin D deficiency 09/15/2014  . Vitamin B12 deficiency (non anemic) 09/15/2014  . Other allergic rhinitis 01/23/2014  . OSA (obstructive sleep apnea) 12/19/2013  . Bruxism 12/19/2013  . Onychomycosis 10/21/2013  . Type 2 diabetes mellitus without complication, without long-term current use of insulin (HCC) 07/08/2013  . HLD (hyperlipidemia) 09/24/2012  . Hypothyroidism 09/24/2012  . Morbid obesity (HCC) 09/24/2012  . Anxiety and depression 09/24/2012    Past Surgical History:  Procedure Laterality Date  . FRACTURE SURGERY     right arch  . HERNIA REPAIR  1990   Right ingruial & umbilical [place]   . LEFT HEART CATHETERIZATION WITH CORONARY ANGIOGRAM N/A 09/26/2011   Procedure: LEFT HEART CATHETERIZATION WITH CORONARY ANGIOGRAM;  Surgeon: Pamella Pert, MD;  Location[facility] CATH LAB;  Service: Cardiovascular;  Laterality: N/A;  . Repair Right Arm Fracture  1996   [place]   . UVULOPALATOPHARYNGOPLASTY (UPPP)/TONSILLECTOMY/SEPTOPLASTY         Home Medications    Prior to  Admission medications   Medication Sig Start Date End Date Taking? Authorizing Provider  amLODipine (NORVASC) 10 MG tablet Take 1 tablet (10 mg total) by mouth daily. 08/30/16  Yes Remus Loffler, PA-C  aspirin 81 MG chewable tablet Chew 81 mg by mouth daily.   Yes [provider]  citalopram (CELEXA) 40 MG tablet Take 1 tablet (40 mg total) by mouth daily. 08/30/16  Yes Remus Loffler, PA-C  fluticasone (FLONASE) 50 MCG/ACT nasal spray Place 1 spray into both nostrils 2 (two) times daily. 08/15/16  Yes Remus Loffler, PA-C  hydrochlorothiazide (HYDRODIURIL) 25 MG tablet Take 1 tablet (25 mg total) by mouth daily. 09/05/16  Yes Remus Loffler, PA-C  levothyroxine (SYNTHROID, LEVOTHROID) 88 MCG tablet Take 1 tablet (88 mcg total) by mouth daily. 04/25/16  Yes Remus Loffler, PA-C  metFORMIN (GLUCOPHAGE) 500 MG tablet Take 1 tablet (500 mg total) by mouth 2 (two) times daily with a meal. 11/01/15  Yes Sandford Craze, NP  rosuvastatin (CRESTOR) 40 MG tablet Take 1 tablet (40 mg total) by mouth daily. 07/05/16  Yes Remus Loffler, PA-C  valsartan (DIOVAN) 160 MG tablet Take 1 tablet (160 mg total) by mouth daily. 08/30/16  Yes Remus Loffler, PA-C  amoxicillin (AMOXIL) 500 MG capsule Take 2 capsules (1,000 mg total) by mouth 2 (two) times daily. Patient not taking: Reported on 09/25/2016 08/15/16   Remus Loffler, PA-C  cephALEXin (KEFLEX) 500 MG capsule  Take 1 capsule (500 mg total) by mouth 4 (four) times daily. 09/25/16   Vanetta Mulders, MD  promethazine (PHENERGAN) 25 MG tablet Take 1 tablet (25 mg total) by mouth every 6 (six) hours as needed for nausea or vomiting. 09/25/16   Vanetta Mulders, MD    Family History Family History  Problem Relation Age of Onset  . Hypertension Mother   . Hypertension Father   . Heart attack Father   . Breast cancer Paternal Grandmother   . Diabetes Paternal Grandmother   . Kidney disease Maternal Grandfather   . Down syndrome Brother     Social  History Social History  Substance Use Topics  . Smoking status: Never Smoker  . Smokeless tobacco: Never Used  . Alcohol use No     Allergies   Atorvastatin and Cymbalta [duloxetine hcl]   Review of Systems Review of Systems  Constitutional: Positive for chills and fever.  HENT: Negative for congestion.   Eyes: Negative for visual disturbance.  Respiratory: Negative for shortness of breath.   Cardiovascular: Negative for chest pain.  Gastrointestinal: Negative for abdominal pain.  Genitourinary: Positive for dysuria, flank pain and hematuria.  Musculoskeletal: Positive for back pain and myalgias.  Skin: Negative for rash.  Allergic/Immunologic: Negative for immunocompromised state.  Neurological: Negative for headaches.  Hematological: Does not bruise/bleed easily.  Psychiatric/Behavioral: Negative for confusion.     Physical Exam Updated Vital Signs BP 108/61   Pulse 90   Temp (!) 101 F (38.3 C) (Oral)   Resp 16   Ht 1.829 m (6')   Wt (!) 140.2 kg (309 lb)   SpO2 97%   BMI 41.91 kg/m   Physical Exam  Constitutional: He is oriented to person, place, and time. He appears well-developed and well-nourished. No distress.  HENT:  Head: Normocephalic and atraumatic.  Mouth/Throat: Oropharynx is clear and moist.  Eyes: Pupils are equal, round, and reactive to light. EOM are normal.  Neck: Normal range of motion.  Cardiovascular: Normal rate, regular rhythm and normal heart sounds.   Pulmonary/Chest: Effort normal and breath sounds normal.  Abdominal: Soft. Bowel sounds are normal.  Musculoskeletal: Normal range of motion.  Neurological: He is alert and oriented to person, place, and time. No cranial nerve deficit or sensory deficit. He exhibits normal muscle tone. Coordination normal.  Skin: No rash noted.  Nursing note and vitals reviewed.    ED Treatments / Results  Labs (all labs ordered are listed, but only abnormal results are displayed) Labs Reviewed   COMPREHENSIVE METABOLIC PANEL - Abnormal; Notable for the following:       Result Value   Chloride 97 (*)    Glucose, Bld 107 (*)    Total Protein 8.3 (*)    All other components within normal limits  CBC WITH DIFFERENTIAL/PLATELET - Abnormal; Notable for the following:    WBC 12.8 (*)    Neutro Abs 8.6 (*)    Monocytes Absolute 1.1 (*)    All other components within normal limits  URINALYSIS, ROUTINE W REFLEX MICROSCOPIC - Abnormal; Notable for the following:    Color, Urine BROWN (*)    APPearance CLOUDY (*)    Specific Gravity, Urine >1.030 (*)    Hgb urine dipstick LARGE (*)    Bilirubin Urine SMALL (*)    Protein, ur 100 (*)    Nitrite POSITIVE (*)    Leukocytes, UA MODERATE (*)    All other components within normal limits  URINALYSIS, MICROSCOPIC (REFLEX) - Abnormal;  Notable for the following:    Bacteria, UA MANY (*)    Squamous Epithelial / LPF 0-5 (*)    All other components within normal limits  CULTURE, BLOOD (ROUTINE X 2)  CULTURE, BLOOD (ROUTINE X 2)  URINE CULTURE  PROTIME-INR  I-STAT CG4 LACTIC ACID, ED  I-STAT CG4 LACTIC ACID, ED    EKG  EKG Interpretation None       Radiology Ct Renal Stone Study  Result Date: 09/25/2016 CLINICAL DATA:  Bilateral flank pain radiating to the penis x2 days. Hematuria and fever. EXAM: CT ABDOMEN AND PELVIS WITHOUT CONTRAST TECHNIQUE: Multidetector CT imaging of the abdomen and pelvis was performed following the standard protocol without IV contrast. COMPARISON:  None. FINDINGS: Lower chest: Limited by respiratory motion artifacts. No acute abnormality noted. Hepatobiliary: No focal liver abnormality is seen. No gallstones, gallbladder wall thickening, or biliary dilatation. Pancreas: Unremarkable. No pancreatic ductal dilatation or surrounding inflammatory changes. Spleen: Normal in size without focal abnormality. Adrenals/Urinary Tract: No nephrolithiasis nor obstructive uropathy. No focal renal mass allowing for  limitations of a noncontrast study. Normal bilateral adrenal glands. The urinary bladder is somewhat thickened in appearance but this may be due to underdistention. A cystitis is not entirely excluded. Clinical correlation is suggested. Stomach/Bowel: Contracted stomach with normal small bowel rotation. No bowel inflammation. Normal appendix. Vascular/Lymphatic: No significant vascular findings are present. No enlarged abdominal or pelvic lymph nodes. Small retroperitoneal and right lower quadrant mesenteric subcentimeter lymph nodes are present. No lymphadenopathy by CT size criteria. Reproductive: Small calcifications potentially representing phleboliths are noted along the right spermatic cord. Small amount of fat within the left inguinal canal. Top-normal sized prostate. Other: Small fat containing umbilical hernia. No abdominopelvic ascites. Musculoskeletal: Broad-based disc bulges L3-4, L4-5 and L5-S1 partially calcified at L3-4. No acute osseous abnormality. IMPRESSION: 1. No nephrolithiasis nor obstructive uropathy. Slightly thick-walled appearance of the bladder may be due to underdistention. Correlate to exclude a cystitis. 2. Small amount of fat in the left inguinal canal. 3. Small fat containing umbilical hernia. 4. Lumbar spondylosis with multilevel broad-based disc bulges and disc-osteophyte complexes. Electronically Signed   By: Tollie Ethavid  Kwon M.D.   On: 09/25/2016 21:00    Procedures Procedures (including critical care time)  Medications Ordered in ED Medications  cefTRIAXone (ROCEPHIN) 2 g in dextrose 5 % 50 mL IVPB (0 g Intravenous Stopped 09/25/16 2148)  acetaminophen (TYLENOL) tablet 650 mg (650 mg Oral Given 09/25/16 2046)     Initial Impression / Assessment and Plan / ED Course  I have reviewed the triage vital signs and the nursing notes.  Pertinent labs & imaging results that were available during my care of the patient were reviewed by me and considered in my medical decision  making (see chart for details).     Patient with symptoms consistent of either urinary tract infection or prostatitis. CT scan with the bladder thickening makes it seem more like urinary tract infection. Could be consistent with a pyelonephritis as well since his bilateral. Urine markedly abnormal. Sent for culture. Patient received 2 g of Rocephin here IV thinking it may be prostatitis prior to the CT. Patient was febrile never tachycardic not hypotensive. Did not meet sepsis criteria by labs. Patient with improvement here with fluids and IV antibiotics. Will be continued on Keflex patient will return if not improving or if worse in the next 2 days. Also given Phenergan at home. Patient has primary care doctor to follow-up to make sure the urine goes back  to normal. Work note provided.  Final Clinical Impressions(s) / ED Diagnoses   Final diagnoses:  Acute cystitis with hematuria  Fever, unspecified fever cause    New Prescriptions New Prescriptions   CEPHALEXIN (KEFLEX) 500 MG CAPSULE    Take 1 capsule (500 mg total) by mouth 4 (four) times daily.   PROMETHAZINE (PHENERGAN) 25 MG TABLET    Take 1 tablet (25 mg total) by mouth every 6 (six) hours as needed for nausea or vomiting.     Vanetta Mulders, MD 09/25/16 2201

## 2016-09-25 NOTE — Telephone Encounter (Signed)
What symptoms do you have? Fever, diarrhea, chills, aches all over  How long have you been sick? Since saturday  Have you been seen for this problem? no  If your provider decides to give you a prescription, which pharmacy would you like for it to be sent to? [facility]. Please call him.    Patient informed that this information will be sent to the clinical staff for review and that they should receive a follow up call.

## 2016-09-25 NOTE — Discharge Instructions (Signed)
Take the antibiotic Keflex as directed. Use the Phenergan for any nausea or vomiting. Would expect significant improvement in the next 2 days. If you're not improving or if he get worse return. First dose of IV antibiotics provided here tonight. CT scan showed no evidence of any kidney stones. Urinalysis consistent with urinary tract infection. Do not think this is prostatitis based on the CT scan. Make an appointment to follow-up with your doctor for recheck of urine in a week. Work note provided.

## 2016-09-26 MED FILL — PROMETHAZINE 25 MG TABLET: 25 | 3 days supply | Qty: 12 | Fill #0

## 2016-09-26 MED FILL — CEPHALEXIN 500 MG CAPSULE: 500 | 7 days supply | Qty: 28 | Fill #0

## 2016-09-28 LAB — URINE CULTURE: Culture: 70000 — AB

## 2016-09-29 ENCOUNTER — Telehealth: Payer: Self-pay

## 2016-09-29 NOTE — Telephone Encounter (Signed)
Post ED Visit - Positive Culture Follow-up  Culture report reviewed by antimicrobial stewardship pharmacist:  []  [name], Pharm.D. []  [name], Pharm.D., BCPS AQ-ID []  [name], Pharm.D., BCPS []  [name], Pharm.D., BCPS []  [place] Far[place], [place].D., BCPS, AAHIVP [x]  [name], Pharm.D., BCPS, AAHIVP []  [name], PharmD, BCPS []  [name], PharmD, BCPS []  [name], PharmD, BCPS  Positive urine culture Treated with Cephalexin, organism sensitive to the same and no further patient follow-up is required at this time.  [name], [name] 09/29/2016, 12:47 PM

## 2016-09-30 LAB — CULTURE, BLOOD (ROUTINE X 2)
CULTURE: NO GROWTH
Culture: NO GROWTH

## 2016-10-19 ENCOUNTER — Encounter: Payer: Self-pay | Admitting: Family Medicine

## 2016-10-19 ENCOUNTER — Ambulatory Visit (INDEPENDENT_AMBULATORY_CARE_PROVIDER_SITE_OTHER): Payer: 59 | Admitting: Family Medicine

## 2016-10-19 VITALS — BP 154/94 | HR 69 | Temp 98.9°F | Ht 72.0 in | Wt 317.4 lb

## 2016-10-19 DIAGNOSIS — R300 Dysuria: Secondary | ICD-10-CM | POA: Diagnosis not present

## 2016-10-19 DIAGNOSIS — N3000 Acute cystitis without hematuria: Secondary | ICD-10-CM

## 2016-10-19 LAB — URINALYSIS, COMPLETE
Bilirubin, UA: NEGATIVE
Glucose, UA: NEGATIVE
Ketones, UA: NEGATIVE
Nitrite, UA: NEGATIVE
PH UA: 5.5 (ref 5.0–7.5)
Specific Gravity, UA: >1.03 — ABNORMAL HIGH (ref 1.005–1.030)
Urobilinogen, Ur: 0.2 mg/dL (ref 0.2–1.0)

## 2016-10-19 LAB — MICROSCOPIC EXAMINATION: RENAL EPITHEL UA: NONE SEEN /HPF

## 2016-10-19 MED ORDER — OLMESARTAN MEDOXOMIL 20 MG PO TABS: 20.0000 mg | ORAL_TABLET | Freq: Every day | ORAL | 0 refills | Status: DC

## 2016-10-19 MED ORDER — METFORMIN HCL 500 MG PO TABS: 500.0000 mg | ORAL_TABLET | Freq: Two times a day (BID) | ORAL | 3 refills | Status: DC

## 2016-10-19 MED ORDER — ROSUVASTATIN CALCIUM 40 MG PO TABS: 40.0000 mg | ORAL_TABLET | Freq: Every day | ORAL | 3 refills | Status: DC

## 2016-10-19 MED ORDER — CIPROFLOXACIN HCL 500 MG PO TABS: 500.0000 mg | ORAL_TABLET | Freq: Two times a day (BID) | ORAL | 0 refills | Status: DC

## 2016-10-19 MED FILL — ROSUVASTATIN CALCIUM 40 MG: 40 | 90 days supply | Qty: 90 | Fill #0

## 2016-10-19 MED FILL — metFORMIN HCL 500 MG TABS: 500 | 90 days supply | Qty: 180 | Fill #0

## 2016-10-19 MED FILL — OLMESARTAN MEDOXOMIL 20 MG: 20 | 90 days supply | Qty: 90 | Fill #0

## 2016-10-19 MED FILL — CIPROFLOXACIN HCL 500 MG TA: 500 | 10 days supply | Qty: 20 | Fill #0

## 2016-10-19 NOTE — Progress Notes (Signed)
BP (!) 154/94   Pulse 69   Temp 98.9 F (37.2 C) (Oral)   Ht 6' (1.829 m)   Wt (!) 317 lb 6.4 oz (144 kg)   BMI 43.05 kg/m    Subjective:    Patient ID: [name], male    DOB: 05/30/1975, 41 y.o.   MRN: [id number]  HPI: [name] is a 41 y.o. male presenting on 10/19/2016 for Hematuria (ED at [facility] 3 wks ago, started again yesterday, UTI, ) and Dysuria   HPI Dysuria and urinary odor Patient has started having dysuria and urinary odor again. He says it's been going on over the past 2-3 days this time. He was treated for a UTI about 3 weeks ago at [facility] with Keflex and Rocephin and he feels like it did resolve completely and the culture did show Escherichia coli that was sensitive to all agents. He comes in today because of symptoms and started back up again. He denies any fevers or chills. He says they are not as bad as they were the previous time and are mostly mild at this point. He does have a little bit of abdominal pressure in the suprapubic region. He says that his blood sugars have been good still and not out of control.  Relevant past medical, surgical, family and social history reviewed and updated as indicated. Interim medical history since our last visit reviewed. Allergies and medications reviewed and updated.  Review of Systems  Constitutional: Negative for chills and fever.  Respiratory: Negative for shortness of breath and wheezing.   Cardiovascular: Negative for chest pain and leg swelling.  Gastrointestinal: Positive for abdominal pain. Negative for constipation, diarrhea, nausea and vomiting.  Genitourinary: Positive for dysuria, frequency and urgency. Negative for hematuria, penile pain and testicular pain.  Musculoskeletal: Negative for back pain and gait problem.  Skin: Negative for rash.  All other systems reviewed and are negative.   Per HPI unless specifically indicated above        Objective:    BP (!) 154/94   Pulse 69   Temp 98.9  F (37.2 C) (Oral)   Ht 6' (1.829 m)   Wt (!) 317 lb 6.4 oz (144 kg)   BMI 43.05 kg/m   Wt Readings from Last 3 Encounters:  10/19/16 (!) 317 lb 6.4 oz (144 kg)  09/25/16 (!) 309 lb (140.2 kg)  08/15/16 (!) 309 lb 9.6 oz (140.4 kg)    Physical Exam  Constitutional: He is oriented to person, place, and time. He appears well-developed and well-nourished. No distress.  Eyes: Conjunctivae are normal. No scleral icterus.  Cardiovascular: Normal rate, regular rhythm, normal heart sounds and intact distal pulses.   No murmur heard. Pulmonary/Chest: Effort normal and breath sounds normal. No respiratory distress. He has no wheezes. He has no rales.  Abdominal: Soft. Bowel sounds are normal. He exhibits no distension. There is no hepatosplenomegaly. There is tenderness in the suprapubic area. There is no rigidity, no rebound, no guarding and no CVA tenderness.  Musculoskeletal: Normal range of motion. He exhibits no edema.  Neurological: He is alert and oriented to person, place, and time. Coordination normal.  Skin: Skin is warm and dry. No rash noted. He is not diaphoretic.  Psychiatric: He has a normal mood and affect. His behavior is normal.  Nursing note and vitals reviewed.   Urinalysis: 6-10 WBCs, 11-30 RBCs, 0-10 epithelial cells, few bacteria, 1+ blood, trace leukocytes    Assessment & Plan:  Problem List Items Addressed This Visit    None    Visit Diagnoses    Acute cystitis without hematuria    -  Primary   Relevant Medications   ciprofloxacin (CIPRO) 500 MG tablet   Other Relevant Orders   Urinalysis, Complete (Completed)       Follow up plan: Return if symptoms worsen or fail to improve.  Counseling provided for all of the vaccine components Orders Placed This Encounter  Procedures  . Microscopic Examination  . Urinalysis, Complete    Arville CareJoshua Dettinger, MD [facility] 10/19/2016, 5:41 PM

## 2016-10-20 ENCOUNTER — Telehealth: Payer: Self-pay | Admitting: Family Medicine

## 2016-10-20 ENCOUNTER — Telehealth: Payer: Self-pay | Admitting: Physician Assistant

## 2016-10-20 NOTE — Telephone Encounter (Signed)
noted 

## 2016-10-20 NOTE — Telephone Encounter (Signed)
Yes go ahead and do note

## 2016-10-20 NOTE — Telephone Encounter (Signed)
Letter written and printed and left detailed message on pt voicemail stating letter is ready for pickup and to call back with any further questions or concerns.

## 2016-10-20 NOTE — Telephone Encounter (Signed)
Patient is talking about where Dr. Algis Downs put him on Benicar from Valsartan. I advised patient that you do agree with the BP med change

## 2016-10-20 NOTE — Telephone Encounter (Signed)
The ciprofloxacin is an appropriate antibiotic and very good medicine for urinary tract infections.  Due to him having a recurrent urinary tract infection I think we should follow-up after this one and recheck the urine to assure resolution. So have him make an appointment in a couple weeks.

## 2016-11-10 ENCOUNTER — Ambulatory Visit: Payer: Self-pay | Admitting: Physician Assistant

## 2016-12-01 ENCOUNTER — Ambulatory Visit (INDEPENDENT_AMBULATORY_CARE_PROVIDER_SITE_OTHER): Payer: 59 | Admitting: Physician Assistant

## 2016-12-01 ENCOUNTER — Encounter: Payer: Self-pay | Admitting: Physician Assistant

## 2016-12-01 VITALS — BP 152/106 | HR 88 | Temp 98.1°F | Ht 72.0 in | Wt 317.0 lb

## 2016-12-01 DIAGNOSIS — R635 Abnormal weight gain: Secondary | ICD-10-CM | POA: Diagnosis not present

## 2016-12-01 DIAGNOSIS — G4733 Obstructive sleep apnea (adult) (pediatric): Secondary | ICD-10-CM | POA: Diagnosis not present

## 2016-12-01 DIAGNOSIS — I10 Essential (primary) hypertension: Secondary | ICD-10-CM

## 2016-12-01 DIAGNOSIS — E119 Type 2 diabetes mellitus without complications: Secondary | ICD-10-CM

## 2016-12-01 LAB — BAYER DCA HB A1C WAIVED: HB A1C: 6.9 % (ref <7.0)

## 2016-12-01 MED ORDER — LIRAGLUTIDE 18 MG/3ML ~~LOC~~ SOPN: 1.8000 mg | PEN_INJECTOR | Freq: Every day | SUBCUTANEOUS | 1 refills | Status: DC

## 2016-12-01 NOTE — Progress Notes (Signed)
BP (!) 152/106   Pulse 88   Temp 98.1 F (36.7 C) (Oral)   Ht 6' (1.829 m)   Wt (!) 317 lb (143.8 kg)   BMI 42.99 kg/m    Subjective:    Patient ID: [name], male    DOB: 1975/06/20, 41 y.o.   MRN: [id number]  HPI: [name] is a 41 y.o. male presenting on 12/01/2016 for Follow-up (Medication recheck)  This patient comes in for periodic recheck on medications and conditions including hypertension, sleep apnea, type 2 diabetes, morbid obesity and weight gain. The patient states that he is really trying to lose weight but is unable to get this under control. He has not been using the CPAP machine. When we sent an order in to the home care company it was for supplies and not the machine. We need to get the study, directions, new machine with supplies sent as an order to his homecare company. His hypertension has been slightly more out of control. I do feel it is more up because of the sleep apnea not being treated, weight gain.  We have discussed weight gain and methods to try to lose. We have talked about lowering carbs in his diet. He states he is always feeling hungry. He does have metabolic syndrome with the diabetes. He will qualify for some of the new medication such as Victoza. It had a long discussion about the benefits of this medication and how he can help him with losing weight. When we get a sample next week we are going to try to have him come back in. A prescription has been printed for him..   All medications are reviewed today. There are no reports of any problems with the medications. All of the medical conditions are reviewed and updated.  Lab work is reviewed and will be ordered as medically necessary. There are no new problems reported with today's visit.   Relevant past medical, surgical, family and social history reviewed and updated as indicated. Allergies and medications reviewed and updated.  Past Medical History:  Diagnosis Date  . Anxiety   .  Depression   . Diabetes mellitus without complication ([place])   . Hyperlipidemia   . Hypertension   . Hypothyroidism   . Obesity   . OSA (obstructive sleep apnea)   . Sinus congestion     Past Surgical History:  Procedure Laterality Date  . FRACTURE SURGERY     right arch  . HERNIA REPAIR  1990   Right ingruial & umbilical [place]   . LEFT HEART CATHETERIZATION WITH CORONARY ANGIOGRAM N/A 09/26/2011   Procedure: LEFT HEART CATHETERIZATION WITH CORONARY ANGIOGRAM;  Surgeon: Laverda Page, MD;  Location: [facility] CATH LAB;  Service: Cardiovascular;  Laterality: N/A;  . Repair Right Arm Fracture  1996   [place]   . UVULOPALATOPHARYNGOPLASTY (UPPP)/TONSILLECTOMY/SEPTOPLASTY      Review of Systems  Constitutional: Positive for fatigue and unexpected weight change. Negative for appetite change.  HENT: Negative.   Eyes: Negative.  Negative for pain and visual disturbance.  Respiratory: Negative.  Negative for cough, chest tightness, shortness of breath and wheezing.   Cardiovascular: Negative.  Negative for chest pain, palpitations and leg swelling.  Gastrointestinal: Negative.  Negative for abdominal pain, diarrhea, nausea and vomiting.  Endocrine: Negative.   Genitourinary: Negative.   Musculoskeletal: Negative.   Skin: Negative.  Negative for color change and rash.  Neurological: Negative.  Negative for weakness, numbness and headaches.  Psychiatric/Behavioral: Negative.  Allergies as of 12/01/2016      Reactions   Atorvastatin Other (See Comments)   Elevated liver enzymes   Cymbalta [duloxetine Hcl] Rash      Medication List       Accurate as of 12/01/16  5:59 PM. Always use your most recent med list.          amLODipine 10 MG tablet Commonly known as:  NORVASC Take 1 tablet (10 mg total) by mouth daily.   aspirin 81 MG chewable tablet Chew 81 mg by mouth daily.   fluticasone 50 MCG/ACT nasal spray Commonly known as:  FLONASE Place 1 spray into both nostrils  2 (two) times daily.   hydrochlorothiazide 25 MG tablet Commonly known as:  HYDRODIURIL Take 1 tablet (25 mg total) by mouth daily.   levothyroxine 88 MCG tablet Commonly known as:  SYNTHROID, LEVOTHROID Take 1 tablet (88 mcg total) by mouth daily.   liraglutide 18 MG/3ML Sopn Commonly known as:  VICTOZA Inject 0.3 mLs (1.8 mg total) into the skin daily.   metFORMIN 500 MG tablet Commonly known as:  GLUCOPHAGE Take 1 tablet (500 mg total) by mouth 2 (two) times daily with a meal.   olmesartan 20 MG tablet Commonly known as:  BENICAR Take 1 tablet (20 mg total) by mouth daily.   rosuvastatin 40 MG tablet Commonly known as:  CRESTOR Take 1 tablet (40 mg total) by mouth daily.            Discharge Care Instructions        Start     Ordered   12/01/16 0000  liraglutide (VICTOZA) 18 MG/3ML SOPN  Daily    Question:  Supervising Provider  Answer:  Timmothy Euler   12/01/16 1659   12/01/16 0000  CBC with Differential/Platelet     12/01/16 1703   12/01/16 0000  CMP14+EGFR     12/01/16 1703   12/01/16 0000  Lipid panel     12/01/16 1703   12/01/16 0000  Bayer DCA Hb A1c Waived     12/01/16 1703         Objective:    BP (!) 152/106   Pulse 88   Temp 98.1 F (36.7 C) (Oral)   Ht 6' (1.829 m)   Wt (!) 317 lb (143.8 kg)   BMI 42.99 kg/m   Allergies  Allergen Reactions  . Atorvastatin Other (See Comments)    Elevated liver enzymes  . Cymbalta [Duloxetine Hcl] Rash    Physical Exam  Constitutional: He appears well-developed and well-nourished.  HENT:  Head: Normocephalic and atraumatic.  Eyes: Pupils are equal, round, and reactive to light. Conjunctivae and EOM are normal.  Neck: Normal range of motion. Neck supple.  Cardiovascular: Normal rate, regular rhythm and normal heart sounds.   Pulmonary/Chest: Effort normal and breath sounds normal.  Abdominal: Soft. Bowel sounds are normal.  Musculoskeletal: Normal range of motion.  Skin: Skin is warm and  dry.  Nursing note and vitals reviewed.   Results for orders placed or performed in visit on 12/01/16  Bayer DCA Hb A1c Waived  Result Value Ref Range   Bayer DCA Hb A1c Waived 6.9 <7.0 %      Assessment & Plan:   1. Weight gain  2. Essential hypertension - CBC with Differential/Platelet - CMP14+EGFR - Lipid panel - Bayer DCA Hb A1c Waived  3. OSA (obstructive sleep apnea) Obtain copy of sleep study performed 2 years ago. Send order for new CPAP machine  and supplies to [facility]  4. Type 2 diabetes mellitus without complication, without long-term current use of insulin (HCC) - liraglutide (VICTOZA) 18 MG/3ML SOPN; Inject 0.3 mLs (1.8 mg total) into the skin daily.  Dispense: 3 mL; Refill: 1 - Lipid panel - Bayer DCA Hb A1c Waived  5. Morbid obesity ([place]) Try to find a sample for him to use before he has to get it filled. We would like to educate him on using this. - liraglutide (VICTOZA) 18 MG/3ML SOPN; Inject 0.3 mLs (1.8 mg total) into the skin daily.  Dispense: 3 mL; Refill: 1    Current Outpatient Prescriptions:  .  amLODipine (NORVASC) 10 MG tablet, Take 1 tablet (10 mg total) by mouth daily., Disp: 90 tablet, Rfl: 0 .  aspirin 81 MG chewable tablet, Chew 81 mg by mouth daily., Disp: , Rfl:  .  fluticasone (FLONASE) 50 MCG/ACT nasal spray, Place 1 spray into both nostrils 2 (two) times daily., Disp: 16 g, Rfl: 6 .  hydrochlorothiazide (HYDRODIURIL) 25 MG tablet, Take 1 tablet (25 mg total) by mouth daily., Disp: 90 tablet, Rfl: 0 .  levothyroxine (SYNTHROID, LEVOTHROID) 88 MCG tablet, Take 1 tablet (88 mcg total) by mouth daily., Disp: 90 tablet, Rfl: 0 .  metFORMIN (GLUCOPHAGE) 500 MG tablet, Take 1 tablet (500 mg total) by mouth 2 (two) times daily with a meal., Disp: 60 tablet, Rfl: 3 .  olmesartan (BENICAR) 20 MG tablet, Take 1 tablet (20 mg total) by mouth daily., Disp: 90 tablet, Rfl: 0 .  rosuvastatin (CRESTOR) 40 MG tablet, Take 1 tablet (40 mg total) by  mouth daily., Disp: 30 tablet, Rfl: 3 .  liraglutide (VICTOZA) 18 MG/3ML SOPN, Inject 0.3 mLs (1.8 mg total) into the skin daily., Disp: 3 mL, Rfl: 1 Continue all other maintenance medications as listed above.  Follow up plan: Return in about 4 weeks (around 12/29/2016) for recheck.  Educational handout given for diet information  Terald Sleeper PA-C [facility] [address]  [place], [place] [address] [phone]   12/01/2016, 5:59 PM

## 2016-12-01 NOTE — Patient Instructions (Signed)
Breakfast: eggs 2-3 Or greek yogurt low fat DANNON 1 slice delightful [employer] bread  Lunch: 2 slice [employer] delightfully bread       Or Nature's Own Light 4 ounces chicken, turkey, roast beef 1 slice Thin sliced cheese [place] Mustard ok 1 piece of fruit  Supper: 6 ounces lean meat 2 cups raw/cooked veg or 1 cup pintos, corn lima  3 snacks 100 calories or less  

## 2016-12-02 LAB — CMP14+EGFR
ALK PHOS: 56 IU/L (ref 39–117)
ALT: 22 IU/L (ref 0–44)
AST: 19 IU/L (ref 0–40)
Albumin/Globulin Ratio: 1.5 (ref 1.2–2.2)
Albumin: 4.5 g/dL (ref 3.5–5.5)
BILIRUBIN TOTAL: 0.2 mg/dL (ref 0.0–1.2)
BUN/Creatinine Ratio: 13 (ref 9–20)
BUN: 15 mg/dL (ref 6–20)
CHLORIDE: 99 mmol/L (ref 96–106)
CO2: 24 mmol/L (ref 20–29)
CREATININE: 1.17 mg/dL (ref 0.76–1.27)
Calcium: 9.6 mg/dL (ref 8.7–10.2)
GFR calc Af Amer: 90 mL/min/{1.73_m2} (ref >59)
GFR calc non Af Amer: 78 mL/min/{1.73_m2} (ref >59)
GLUCOSE: 87 mg/dL (ref 65–99)
Globulin, Total: 3.1 g/dL (ref 1.5–4.5)
Potassium: 4.1 mmol/L (ref 3.5–5.2)
Sodium: 138 mmol/L (ref 134–144)
Total Protein: 7.6 g/dL (ref 6.0–8.5)

## 2016-12-02 LAB — CBC WITH DIFFERENTIAL/PLATELET
BASOS ABS: 0 10*3/uL (ref 0.0–0.2)
Basos: 0 %
EOS (ABSOLUTE): 0.1 10*3/uL (ref 0.0–0.4)
Eos: 2 %
Hematocrit: 41.7 % (ref 37.5–51.0)
Hemoglobin: 13.9 g/dL (ref 13.0–17.7)
Immature Grans (Abs): 0 10*3/uL (ref 0.0–0.1)
Immature Granulocytes: 0 %
LYMPHS ABS: 2.8 10*3/uL (ref 0.7–3.1)
Lymphs: 48 %
MCH: 26.7 pg (ref 26.6–33.0)
MCHC: 33.3 g/dL (ref 31.5–35.7)
MCV: 80 fL (ref 79–97)
MONOCYTES: 11 %
MONOS ABS: 0.6 10*3/uL (ref 0.1–0.9)
Neutrophils Absolute: 2.3 10*3/uL (ref 1.4–7.0)
Neutrophils: 39 %
PLATELETS: 310 10*3/uL (ref 150–379)
RBC: 5.21 x10E6/uL (ref 4.14–5.80)
RDW: 16.5 % — AB (ref 12.3–15.4)
WBC: 5.8 10*3/uL (ref 3.4–10.8)

## 2016-12-02 LAB — LIPID PANEL
CHOLESTEROL TOTAL: 197 mg/dL (ref 100–199)
Chol/HDL Ratio: 4.7 ratio (ref 0.0–5.0)
HDL: 42 mg/dL (ref >39)
LDL CALC: 140 mg/dL — AB (ref 0–99)
TRIGLYCERIDES: 77 mg/dL (ref 0–149)
VLDL CHOLESTEROL CAL: 15 mg/dL (ref 5–40)

## 2016-12-04 ENCOUNTER — Other Ambulatory Visit: Payer: Self-pay | Admitting: Physician Assistant

## 2016-12-04 ENCOUNTER — Telehealth: Payer: Self-pay | Admitting: Physician Assistant

## 2016-12-04 DIAGNOSIS — E119 Type 2 diabetes mellitus without complications: Secondary | ICD-10-CM

## 2016-12-04 MED ORDER — LIRAGLUTIDE 18 MG/3ML ~~LOC~~ SOPN
1.8000 mg | PEN_INJECTOR | Freq: Every day | SUBCUTANEOUS | 1 refills | Status: DC
Start: 2016-12-04 — End: 2016-12-04

## 2016-12-04 MED ORDER — LIRAGLUTIDE 18 MG/3ML ~~LOC~~ SOPN: 1.8000 mg | PEN_INJECTOR | Freq: Every day | SUBCUTANEOUS | 1 refills | Status: DC

## 2016-12-04 NOTE — Telephone Encounter (Signed)
Pt aware of labs  

## 2016-12-07 ENCOUNTER — Encounter: Payer: Self-pay | Admitting: *Deleted

## 2016-12-13 ENCOUNTER — Other Ambulatory Visit: Payer: Self-pay | Admitting: *Deleted

## 2016-12-13 DIAGNOSIS — G4733 Obstructive sleep apnea (adult) (pediatric): Secondary | ICD-10-CM

## 2016-12-25 ENCOUNTER — Other Ambulatory Visit: Payer: Self-pay | Admitting: Physician Assistant

## 2016-12-25 DIAGNOSIS — G4730 Sleep apnea, unspecified: Secondary | ICD-10-CM

## 2017-01-02 ENCOUNTER — Ambulatory Visit: Payer: Self-pay | Admitting: Physician Assistant

## 2017-01-03 ENCOUNTER — Encounter: Payer: Self-pay | Admitting: Physician Assistant

## 2017-01-19 ENCOUNTER — Other Ambulatory Visit: Payer: Self-pay | Admitting: *Deleted

## 2017-01-19 ENCOUNTER — Ambulatory Visit (INDEPENDENT_AMBULATORY_CARE_PROVIDER_SITE_OTHER): Payer: 59 | Admitting: Physician Assistant

## 2017-01-19 ENCOUNTER — Encounter: Payer: Self-pay | Admitting: Physician Assistant

## 2017-01-19 VITALS — BP 133/83 | HR 99 | Temp 98.7°F | Ht 72.0 in | Wt 319.0 lb

## 2017-01-19 DIAGNOSIS — I10 Essential (primary) hypertension: Secondary | ICD-10-CM

## 2017-01-19 DIAGNOSIS — E119 Type 2 diabetes mellitus without complications: Secondary | ICD-10-CM

## 2017-01-19 DIAGNOSIS — G4733 Obstructive sleep apnea (adult) (pediatric): Secondary | ICD-10-CM | POA: Diagnosis not present

## 2017-01-19 MED ORDER — AMLODIPINE BESYLATE 10 MG PO TABS: 10.0000 mg | ORAL_TABLET | Freq: Every day | ORAL | 0 refills | Status: DC

## 2017-01-19 MED FILL — AMLODIPINE BESYLATE 10 MG T: 10 | 90 days supply | Qty: 90 | Fill #0

## 2017-01-19 NOTE — Patient Instructions (Signed)

## 2017-01-19 NOTE — Progress Notes (Signed)
Plan BP at bed 1 week, Next option celexa 10 mg    BP 133/83 (BP Location: Left Arm)   Pulse 99   Temp 98.7 F (37.1 C) (Oral)   Ht 6' (1.829 m)   Wt (!) 319 lb (144.7 kg)   BMI 43.26 kg/m    Subjective:    Patient ID: [name], male    DOB: [birth year], 41 y.o.   MRN: [id number]  HPI: [name] is a 41 y.o. male presenting on 01/19/2017 for Follow-up (3 mos - meds and labs)  This patient comes in for 67-monthrecheck.  He did not start the Victoza due to not wanting to start injections for his diabetes.  We will have labs drawn today to see where he is controlled wise.  He really wants to try to work on diet and exercise to reduce his medication load.  I have encouraged him to work hard on this and recheck in the next 2-3 months.  Otherwise he is feeling good.  Blood pressure is 133/83.  He had been at work and just got off to come into our visit.  Relevant past medical, surgical, family and social history reviewed and updated as indicated. Allergies and medications reviewed and updated.  Past Medical History:  Diagnosis Date  . Anxiety   . Depression   . Diabetes mellitus without complication ([place]   . Hyperlipidemia   . Hypertension   . Hypothyroidism   . Obesity   . OSA (obstructive sleep apnea)   . Sinus congestion     Past Surgical History:  Procedure Laterality Date  . FRACTURE SURGERY     right arch  . HERNIA REPAIR  1990   Right ingruial & umbilical [place]   . Repair Right Arm Fracture  1996   [place]   . UVULOPALATOPHARYNGOPLASTY (UPPP)/TONSILLECTOMY/SEPTOPLASTY      Review of Systems  Constitutional: Positive for fatigue and unexpected weight change. Negative for appetite change.  HENT: Negative.   Eyes: Negative.  Negative for pain and visual disturbance.  Respiratory: Negative.  Negative for cough, chest tightness, shortness of breath and wheezing.   Cardiovascular: Negative.  Negative for chest pain, palpitations and leg swelling.    Gastrointestinal: Negative.  Negative for abdominal pain, diarrhea, nausea and vomiting.  Endocrine: Negative.   Genitourinary: Negative.   Musculoskeletal: Negative.   Skin: Negative.  Negative for color change and rash.  Neurological: Negative.  Negative for weakness, numbness and headaches.  Psychiatric/Behavioral: Negative.     Allergies as of 01/19/2017      Reactions   Atorvastatin Other (See Comments)   Elevated liver enzymes   Cymbalta [duloxetine Hcl] Rash      Medication List        Accurate as of 01/19/17 11:59 PM. Always use your most recent med list.          amLODipine 10 MG tablet Commonly known as:  NORVASC Take 1 tablet (10 mg total) daily by mouth.   aspirin 81 MG chewable tablet Chew 81 mg by mouth daily.   fluticasone 50 MCG/ACT nasal spray Commonly known as:  FLONASE Place 1 spray into both nostrils 2 (two) times daily.   hydrochlorothiazide 25 MG tablet Commonly known as:  HYDRODIURIL Take 1 tablet (25 mg total) by mouth daily.   levothyroxine 88 MCG tablet Commonly known as:  SYNTHROID, LEVOTHROID Take 1 tablet (88 mcg total) by mouth daily.   metFORMIN 500 MG tablet Commonly known as:  GLUCOPHAGE Take  1 tablet (500 mg total) by mouth 2 (two) times daily with a meal.   olmesartan 20 MG tablet Commonly known as:  BENICAR Take 1 tablet (20 mg total) by mouth daily.   rosuvastatin 40 MG tablet Commonly known as:  CRESTOR Take 1 tablet (40 mg total) by mouth daily.          Objective:    BP 133/83 (BP Location: Left Arm)   Pulse 99   Temp 98.7 F (37.1 C) (Oral)   Ht 6' (1.829 m)   Wt (!) 319 lb (144.7 kg)   BMI 43.26 kg/m   Allergies  Allergen Reactions  . Atorvastatin Other (See Comments)    Elevated liver enzymes  . Cymbalta [Duloxetine Hcl] Rash    Physical Exam  Constitutional: He appears well-developed and well-nourished. No distress.  HENT:  Head: Normocephalic and atraumatic.  Eyes: Conjunctivae and EOM are  normal. Pupils are equal, round, and reactive to light.  Cardiovascular: Normal rate, regular rhythm and normal heart sounds.  Pulmonary/Chest: Effort normal and breath sounds normal. No respiratory distress.  Skin: Skin is warm and dry.  Psychiatric: He has a normal mood and affect. His behavior is normal.  Nursing note and vitals reviewed.   Results for orders placed or performed in visit on 12/01/16  CBC with Differential/Platelet  Result Value Ref Range   WBC 5.8 3.4 - 10.8 x10E3/uL   RBC 5.21 4.14 - 5.80 x10E6/uL   Hemoglobin 13.9 13.0 - 17.7 g/dL   Hematocrit 41.7 37.5 - 51.0 %   MCV 80 79 - 97 fL   MCH 26.7 26.6 - 33.0 pg   MCHC 33.3 31.5 - 35.7 g/dL   RDW 16.5 (H) 12.3 - 15.4 %   Platelets 310 150 - 379 x10E3/uL   Neutrophils 39 Not Estab. %   Lymphs 48 Not Estab. %   Monocytes 11 Not Estab. %   Eos 2 Not Estab. %   Basos 0 Not Estab. %   Neutrophils Absolute 2.3 1.4 - 7.0 x10E3/uL   Lymphocytes Absolute 2.8 0.7 - 3.1 x10E3/uL   Monocytes Absolute 0.6 0.1 - 0.9 x10E3/uL   EOS (ABSOLUTE) 0.1 0.0 - 0.4 x10E3/uL   Basophils Absolute 0.0 0.0 - 0.2 x10E3/uL   Immature Granulocytes 0 Not Estab. %   Immature Grans (Abs) 0.0 0.0 - 0.1 x10E3/uL  CMP14+EGFR  Result Value Ref Range   Glucose 87 65 - 99 mg/dL   BUN 15 6 - 20 mg/dL   Creatinine, Ser 1.17 0.76 - 1.27 mg/dL   GFR calc non Af Amer 78 >59 mL/min/1.73   GFR calc Af Amer 90 >59 mL/min/1.73   BUN/Creatinine Ratio 13 9 - 20   Sodium 138 134 - 144 mmol/L   Potassium 4.1 3.5 - 5.2 mmol/L   Chloride 99 96 - 106 mmol/L   CO2 24 20 - 29 mmol/L   Calcium 9.6 8.7 - 10.2 mg/dL   Total Protein 7.6 6.0 - 8.5 g/dL   Albumin 4.5 3.5 - 5.5 g/dL   Globulin, Total 3.1 1.5 - 4.5 g/dL   Albumin/Globulin Ratio 1.5 1.2 - 2.2   Bilirubin Total 0.2 0.0 - 1.2 mg/dL   Alkaline Phosphatase 56 39 - 117 IU/L   AST 19 0 - 40 IU/L   ALT 22 0 - 44 IU/L  Lipid panel  Result Value Ref Range   Cholesterol, Total 197 100 - 199 mg/dL    Triglycerides 77 0 - 149 mg/dL   HDL  42 >39 mg/dL   VLDL Cholesterol Cal 15 5 - 40 mg/dL   LDL Calculated 140 (H) 0 - 99 mg/dL   Chol/HDL Ratio 4.7 0.0 - 5.0 ratio  Bayer DCA Hb A1c Waived  Result Value Ref Range   Bayer DCA Hb A1c Waived 6.9 <7.0 %      Assessment & Plan:   1. Essential hypertension  2. OSA (obstructive sleep apnea)  3. Type 2 diabetes mellitus without complication, without long-term current use of insulin ([place])  4. Morbid obesity (H[facility]) Diet and exercise   Current Outpatient Medications:  .  amLODipine (NORVASC) 10 MG tablet, Take 1 tablet (10 mg total) daily by mouth., Disp: 90 tablet, Rfl: 0 .  aspirin 81 MG chewable tablet, Chew 81 mg by mouth daily., Disp: , Rfl:  .  fluticasone (FLONASE) 50 MCG/ACT nasal spray, Place 1 spray into both nostrils 2 (two) times daily., Disp: 16 g, Rfl: 6 .  hydrochlorothiazide (HYDRODIURIL) 25 MG tablet, Take 1 tablet (25 mg total) by mouth daily., Disp: 90 tablet, Rfl: 0 .  levothyroxine (SYNTHROID, LEVOTHROID) 88 MCG tablet, Take 1 tablet (88 mcg total) by mouth daily., Disp: 90 tablet, Rfl: 0 .  metFORMIN (GLUCOPHAGE) 500 MG tablet, Take 1 tablet (500 mg total) by mouth 2 (two) times daily with a meal., Disp: 60 tablet, Rfl: 3 .  olmesartan (BENICAR) 20 MG tablet, Take 1 tablet (20 mg total) by mouth daily., Disp: 90 tablet, Rfl: 0 .  rosuvastatin (CRESTOR) 40 MG tablet, Take 1 tablet (40 mg total) by mouth daily., Disp: 30 tablet, Rfl: 3 Continue all other maintenance medications as listed above.  Follow up plan: Return in about 3 months (around 04/21/2017) for recheck and labs.  Educational handout given for [place] PA-C [facility] [address]  [place], [place] [address] [phone]   01/22/2017, 12:55 PM

## 2017-04-23 ENCOUNTER — Ambulatory Visit: Payer: Self-pay | Admitting: Physician Assistant

## 2017-04-24 ENCOUNTER — Ambulatory Visit: Payer: Self-pay | Admitting: Physician Assistant

## 2017-05-14 ENCOUNTER — Ambulatory Visit (INDEPENDENT_AMBULATORY_CARE_PROVIDER_SITE_OTHER): Payer: 59 | Admitting: Physician Assistant

## 2017-05-14 ENCOUNTER — Encounter: Payer: Self-pay | Admitting: Physician Assistant

## 2017-05-14 VITALS — BP 172/101 | HR 89 | Temp 99.0°F | Ht 72.0 in | Wt 318.6 lb

## 2017-05-14 DIAGNOSIS — E119 Type 2 diabetes mellitus without complications: Secondary | ICD-10-CM | POA: Diagnosis not present

## 2017-05-14 DIAGNOSIS — E039 Hypothyroidism, unspecified: Secondary | ICD-10-CM

## 2017-05-14 DIAGNOSIS — E785 Hyperlipidemia, unspecified: Secondary | ICD-10-CM

## 2017-05-14 DIAGNOSIS — I10 Essential (primary) hypertension: Secondary | ICD-10-CM | POA: Diagnosis not present

## 2017-05-14 MED ORDER — METFORMIN HCL 500 MG PO TABS: 500.0000 mg | ORAL_TABLET | Freq: Two times a day (BID) | ORAL | 3 refills | Status: DC

## 2017-05-14 MED ORDER — OLMESARTAN MEDOXOMIL 20 MG PO TABS: 20.0000 mg | ORAL_TABLET | Freq: Every day | ORAL | 0 refills | Status: DC

## 2017-05-14 MED ORDER — AMLODIPINE BESYLATE 10 MG PO TABS: 10.0000 mg | ORAL_TABLET | Freq: Every day | ORAL | 3 refills | Status: DC

## 2017-05-14 MED ORDER — HYDROCHLOROTHIAZIDE 25 MG PO TABS: 25.0000 mg | ORAL_TABLET | Freq: Every day | ORAL | 3 refills | Status: DC

## 2017-05-14 MED ORDER — ROSUVASTATIN CALCIUM 40 MG PO TABS: 40.0000 mg | ORAL_TABLET | Freq: Every day | ORAL | 3 refills | Status: DC

## 2017-05-14 MED FILL — OLMESARTAN MEDOXOMIL 20 MG: 20 | 30 days supply | Qty: 30 | Fill #0

## 2017-05-14 MED FILL — AMLODIPINE BESYLATE 10 MG T: 10 | 90 days supply | Qty: 90 | Fill #0

## 2017-05-14 MED FILL — ROSUVASTATIN CALCIUM 40 MG: 40 | 90 days supply | Qty: 90 | Fill #0

## 2017-05-14 MED FILL — HYDROCHLOROTHIAZIDE 25 MG T: 25 | 90 days supply | Qty: 90 | Fill #0

## 2017-05-14 NOTE — Patient Instructions (Addendum)
45 g carb per meal  15 g snack    DASH Eating Plan DASH stands for "Dietary Approaches to Stop Hypertension." The DASH eating plan is a healthy eating plan that has been shown to reduce high blood pressure (hypertension). It may also reduce your risk for type 2 diabetes, heart disease, and stroke. The DASH eating plan may also help with weight loss. What are tips for following this plan? General guidelines  Avoid eating more than 2,300 mg (milligrams) of salt (sodium) a day. If you have hypertension, you may need to reduce your sodium intake to 1,500 mg a day.  Limit alcohol intake to no more than 1 drink a day for nonpregnant women and 2 drinks a day for men. One drink equals 12 oz of beer, 5 oz of wine, or 1 oz of hard liquor.  Work with your health care provider to maintain a healthy body weight or to lose weight. Ask what an ideal weight is for you.  Get at least 30 minutes of exercise that causes your heart to beat faster (aerobic exercise) most days of the week. Activities may include walking, swimming, or biking.  Work with your health care provider or diet and nutrition specialist (dietitian) to adjust your eating plan to your individual calorie needs. Reading food labels  Check food labels for the amount of sodium per serving. Choose foods with less than 5 percent of the Daily Value of sodium. Generally, foods with less than 300 mg of sodium per serving fit into this eating plan.  To find whole grains, look for the word "whole" as the first word in the ingredient list. Shopping  Buy products labeled as "low-sodium" or "no salt added."  Buy fresh foods. Avoid canned foods and premade or frozen meals. Cooking  Avoid adding salt when cooking. Use salt-free seasonings or herbs instead of table salt or sea salt. Check with your health care provider or pharmacist before using salt substitutes.  Do not fry foods. Cook foods using healthy methods such as baking, boiling, grilling,  and broiling instead.  Cook with heart-healthy oils, such as olive, canola, soybean, or sunflower oil. Meal planning   Eat a balanced diet that includes: ? 5 or more servings of fruits and vegetables each day. At each meal, try to fill half of your plate with fruits and vegetables. ? Up to 6-8 servings of whole grains each day. ? Less than 6 oz of lean meat, poultry, or fish each day. A 3-oz serving of meat is about the same size as a deck of cards. One egg equals 1 oz. ? 2 servings of low-fat dairy each day. ? A serving of nuts, seeds, or beans 5 times each week. ? Heart-healthy fats. Healthy fats called Omega-3 fatty acids are found in foods such as flaxseeds and coldwater fish, like sardines, salmon, and mackerel.  Limit how much you eat of the following: ? Canned or prepackaged foods. ? Food that is high in trans fat, such as fried foods. ? Food that is high in saturated fat, such as fatty meat. ? Sweets, desserts, sugary drinks, and other foods with added sugar. ? Full-fat dairy products.  Do not salt foods before eating.  Try to eat at least 2 vegetarian meals each week.  Eat more home-cooked food and less restaurant, buffet, and fast food.  When eating at a restaurant, ask that your food be prepared with less salt or no salt, if possible. What foods are recommended? The items listed  may not be a complete list. Talk with your dietitian about what dietary choices are best for you. Grains Whole-grain or whole-wheat bread. Whole-grain or whole-wheat pasta. Brown rice. Orpah Cobb. Bulgur. Whole-grain and low-sodium cereals. Pita bread. Low-fat, low-sodium crackers. Whole-wheat flour tortillas. Vegetables Fresh or frozen vegetables (raw, steamed, roasted, or grilled). Low-sodium or reduced-sodium tomato and vegetable juice. Low-sodium or reduced-sodium tomato sauce and tomato paste. Low-sodium or reduced-sodium canned vegetables. Fruits All fresh, dried, or frozen fruit.  Canned fruit in natural juice (without added sugar). Meat and other protein foods Skinless chicken or [place]. Ground chicken or [place]. Pork with fat trimmed off. Fish and seafood. Egg whites. Dried beans, peas, or lentils. Unsalted nuts, nut butters, and seeds. Unsalted canned beans. Lean cuts of beef with fat trimmed off. Low-sodium, lean deli meat. Dairy Low-fat (1%) or fat-free (skim) milk. Fat-free, low-fat, or reduced-fat cheeses. Nonfat, low-sodium ricotta or cottage cheese. Low-fat or nonfat yogurt. Low-fat, low-sodium cheese. Fats and oils Soft margarine without trans fats. Vegetable oil. Low-fat, reduced-fat, or light mayonnaise and salad dressings (reduced-sodium). Canola, safflower, olive, soybean, and sunflower oils. Avocado. Seasoning and other foods Herbs. Spices. Seasoning mixes without salt. Unsalted popcorn and pretzels. Fat-free sweets. What foods are not recommended? The items listed may not be a complete list. Talk with your dietitian about what dietary choices are best for you. Grains Baked goods made with fat, such as croissants, muffins, or some breads. Dry pasta or rice meal packs. Vegetables Creamed or fried vegetables. Vegetables in a cheese sauce. Regular canned vegetables (not low-sodium or reduced-sodium). Regular canned tomato sauce and paste (not low-sodium or reduced-sodium). Regular tomato and vegetable juice (not low-sodium or reduced-sodium). Rosita Fire. Olives. Fruits Canned fruit in a light or heavy syrup. Fried fruit. Fruit in cream or butter sauce. Meat and other protein foods Fatty cuts of meat. Ribs. Fried meat. Tomasa Blase. Sausage. Bologna and other processed lunch meats. Salami. Fatback. Hotdogs. Bratwurst. Salted nuts and seeds. Canned beans with added salt. Canned or smoked fish. Whole eggs or egg yolks. Chicken or [place] with skin. Dairy Whole or 2% milk, cream, and half-and-half. Whole or full-fat cream cheese. Whole-fat or sweetened yogurt. Full-fat  cheese. Nondairy creamers. Whipped toppings. Processed cheese and cheese spreads. Fats and oils Butter. Stick margarine. Lard. Shortening. Ghee. Bacon fat. Tropical oils, such as coconut, palm kernel, or palm oil. Seasoning and other foods Salted popcorn and pretzels. Onion salt, garlic salt, seasoned salt, table salt, and sea salt. Worcestershire sauce. Tartar sauce. Barbecue sauce. Teriyaki sauce. Soy sauce, including reduced-sodium. Steak sauce. Canned and packaged gravies. Fish sauce. Oyster sauce. Cocktail sauce. Horseradish that you find on the shelf. Ketchup. Mustard. Meat flavorings and tenderizers. Bouillon cubes. Hot sauce and Tabasco sauce. Premade or packaged marinades. Premade or packaged taco seasonings. Relishes. Regular salad dressings. Where to find more information:  National Heart, Lung, and Blood Institute: PopSteam.is  American Heart Association: www.heart.org Summary  The DASH eating plan is a healthy eating plan that has been shown to reduce high blood pressure (hypertension). It may also reduce your risk for type 2 diabetes, heart disease, and stroke.  With the DASH eating plan, you should limit salt (sodium) intake to 2,300 mg a day. If you have hypertension, you may need to reduce your sodium intake to 1,500 mg a day.  When on the DASH eating plan, aim to eat more fresh fruits and vegetables, whole grains, lean proteins, low-fat dairy, and heart-healthy fats.  Work with your health care provider or diet  and nutrition specialist (dietitian) to adjust your eating plan to your individual calorie needs. This information is not intended to replace advice given to you by your health care provider. Make sure you discuss any questions you have with your health care provider. Document Released: 02/16/2011 Document Revised: 02/21/2016 Document Reviewed: 02/21/2016 Elsevier Interactive Patient Education  2018 [employer]  Carbohydrate Counting for Diabetes Mellitus,  Adult Carbohydrate counting is a method for keeping track of how many carbohydrates you eat. Eating carbohydrates naturally increases the amount of sugar (glucose) in the blood. Counting how many carbohydrates you eat helps keep your blood glucose within normal limits, which helps you manage your diabetes (diabetes mellitus). It is important to know how many carbohydrates you can safely have in each meal. This is different for every person. A diet and nutrition specialist (registered dietitian) can help you make a meal plan and calculate how many carbohydrates you should have at each meal and snack. Carbohydrates are found in the following foods:  Grains, such as breads and cereals.  Dried beans and soy products.  Starchy vegetables, such as potatoes, peas, and corn.  Fruit and fruit juices.  Milk and yogurt.  Sweets and snack foods, such as cake, cookies, candy, chips, and soft drinks.  How do I count carbohydrates? There are two ways to count carbohydrates in food. You can use either of the methods or a combination of both. Reading "Nutrition Facts" on packaged food The "Nutrition Facts" list is included on the labels of almost all packaged foods and beverages in the [place] It includes:  The serving size.  Information about nutrients in each serving, including the grams (g) of carbohydrate per serving.  To use the "Nutrition Facts":  Decide how many servings you will have.  Multiply the number of servings by the number of carbohydrates per serving.  The resulting number is the total amount of carbohydrates that you will be having.  Learning standard serving sizes of other foods When you eat foods containing carbohydrates that are not packaged or do not include "Nutrition Facts" on the label, you need to measure the servings in order to count the amount of carbohydrates:  Measure the foods that you will eat with a food scale or measuring cup, if needed.  Decide how many  standard-size servings you will eat.  Multiply the number of servings by 15. Most carbohydrate-rich foods have about 15 g of carbohydrates per serving. ? For example, if you eat 8 oz (170 g) of strawberries, you will have eaten 2 servings and 30 g of carbohydrates (2 servings x 15 g = 30 g).  For foods that have more than one food mixed, such as soups and casseroles, you must count the carbohydrates in each food that is included.  The following list contains standard serving sizes of common carbohydrate-rich foods. Each of these servings has about 15 g of carbohydrates:   hamburger bun or  English muffin.   oz (15 mL) syrup.   oz (14 g) jelly.  1 slice of bread.  1 six-inch tortilla.  3 oz (85 g) cooked rice or pasta.  4 oz (113 g) cooked dried beans.  4 oz (113 g) starchy vegetable, such as peas, corn, or potatoes.  4 oz (113 g) hot cereal.  4 oz (113 g) mashed potatoes or  of a large baked potato.  4 oz (113 g) canned or frozen fruit.  4 oz (120 mL) fruit juice.  4-6 crackers.  6 chicken nuggets.  6 oz (170 g) unsweetened dry cereal.  6 oz (170 g) plain fat-free yogurt or yogurt sweetened with artificial sweeteners.  8 oz (240 mL) milk.  8 oz (170 g) fresh fruit or one small piece of fruit.  24 oz (680 g) popped popcorn.  Example of carbohydrate counting Sample meal  3 oz (85 g) chicken breast.  6 oz (170 g) brown rice.  4 oz (113 g) corn.  8 oz (240 mL) milk.  8 oz (170 g) strawberries with sugar-free whipped topping. Carbohydrate calculation 1. Identify the foods that contain carbohydrates: ? Rice. ? Corn. ? Milk. ? Strawberries. 2. Calculate how many servings you have of each food: ? 2 servings rice. ? 1 serving corn. ? 1 serving milk. ? 1 serving strawberries. 3. Multiply each number of servings by 15 g: ? 2 servings rice x 15 g = 30 g. ? 1 serving corn x 15 g = 15 g. ? 1 serving milk x 15 g = 15 g. ? 1 serving strawberries x 15 g =  15 g. 4. Add together all of the amounts to find the total grams of carbohydrates eaten: ? 30 g + 15 g + 15 g + 15 g = 75 g of carbohydrates total. This information is not intended to replace advice given to you by your health care provider. Make sure you discuss any questions you have with your health care provider. Document Released: 02/27/2005 Document Revised: 09/17/2015 Document Reviewed: 08/11/2015 Elsevier Interactive Patient Education  [employer].

## 2017-05-14 NOTE — Progress Notes (Signed)
BP (!) 172/101   Pulse 89   Temp 99 F (37.2 C) (Oral)   Ht 6' (1.829 m)   Wt (!) 318 lb 9.6 oz (144.5 kg)   BMI 43.21 kg/m    Subjective:    Patient ID: [name], male    DOB: 15-Oct-1975, 42 y.o.   MRN: [id number]  HPI: [name] is a 42 y.o. male presenting on 05/14/2017 for Follow-up (3 month )  She comes in for 22-monthrecheck on his medical conditions.  They include hypertension, hypothyroidism, diabetes, hyperlipidemia.  He admits to not taking his medications very well he states he does not want to believe that he has diabetes and so therefore he has been resistant in taking metformin.  He also is out of blood pressure medication.  I can tell from his refills that he is not consistently taking his medication.  Past Medical History:  Diagnosis Date  . Anxiety   . Depression   . Diabetes mellitus without complication ([facility]   . Hyperlipidemia   . Hypertension   . Hypothyroidism   . Obesity   . OSA (obstructive sleep apnea)   . Sinus congestion    Relevant past medical, surgical, family and social history reviewed and updated as indicated. Interim medical history since our last visit reviewed. Allergies and medications reviewed and updated. DATA REVIEWED: CHART IN EPIC  Family History reviewed for pertinent findings.  Review of Systems  Constitutional: Negative.  Negative for appetite change and fatigue.  HENT: Negative.   Eyes: Negative.  Negative for pain and visual disturbance.  Respiratory: Negative.  Negative for cough, chest tightness, shortness of breath and wheezing.   Cardiovascular: Negative.  Negative for chest pain, palpitations and leg swelling.  Gastrointestinal: Negative.  Negative for abdominal pain, diarrhea, nausea and vomiting.  Endocrine: Negative.   Genitourinary: Negative.   Musculoskeletal: Negative.   Skin: Negative.  Negative for color change and rash.  Neurological: Negative.  Negative for weakness, numbness and headaches.    Psychiatric/Behavioral: Negative.     Allergies as of 05/14/2017      Reactions   Atorvastatin Other (See Comments)   Elevated liver enzymes   Cymbalta [duloxetine Hcl] Rash      Medication List        Accurate as of 05/14/17  5:05 PM. Always use your most recent med list.          amLODipine 10 MG tablet Commonly known as:  NORVASC Take 1 tablet (10 mg total) by mouth daily.   aspirin 81 MG chewable tablet Chew 81 mg by mouth daily.   fluticasone 50 MCG/ACT nasal spray Commonly known as:  FLONASE Place 1 spray into both nostrils 2 (two) times daily.   hydrochlorothiazide 25 MG tablet Commonly known as:  HYDRODIURIL Take 1 tablet (25 mg total) by mouth daily.   levothyroxine 88 MCG tablet Commonly known as:  SYNTHROID, LEVOTHROID Take 1 tablet (88 mcg total) by mouth daily.   metFORMIN 500 MG tablet Commonly known as:  GLUCOPHAGE Take 1 tablet (500 mg total) by mouth 2 (two) times daily with a meal.   olmesartan 20 MG tablet Commonly known as:  BENICAR Take 1 tablet (20 mg total) by mouth daily.   rosuvastatin 40 MG tablet Commonly known as:  CRESTOR Take 1 tablet (40 mg total) by mouth daily.          Objective:    BP (!) 172/101   Pulse 89   Temp  99 F (37.2 C) (Oral)   Ht 6' (1.829 m)   Wt (!) 318 lb 9.6 oz (144.5 kg)   BMI 43.21 kg/m   Allergies  Allergen Reactions  . Atorvastatin Other (See Comments)    Elevated liver enzymes  . Cymbalta [Duloxetine Hcl] Rash    Wt Readings from Last 3 Encounters:  05/14/17 (!) 318 lb 9.6 oz (144.5 kg)  01/19/17 (!) 319 lb (144.7 kg)  12/01/16 (!) 317 lb (143.8 kg)    Physical Exam  Constitutional: He appears well-developed and well-nourished.  HENT:  Head: Normocephalic and atraumatic.  Eyes: Conjunctivae and EOM are normal. Pupils are equal, round, and reactive to light.  Neck: Normal range of motion. Neck supple.  Cardiovascular: Normal rate, regular rhythm and normal heart sounds.   Pulmonary/Chest: Effort normal and breath sounds normal.  Abdominal: Soft. Bowel sounds are normal.  Musculoskeletal: Normal range of motion.  Skin: Skin is warm and dry.        Assessment & Plan:   1. Essential hypertension - CBC with Differential/Platelet - CMP14+EGFR - Lipid panel  2. Hypothyroidism, unspecified type - TSH  3. Type 2 diabetes mellitus without complication, without long-term current use of insulin (HCC) - Bayer DCA Hb A1c Waived  4. Hyperlipidemia, unspecified hyperlipidemia type - Lipid panel   Continue all other maintenance medications as listed above.  Follow up plan: Return in about 4 weeks (around 06/11/2017) for recheck.  Educational handout given for [place] PA-C [facility] [address]  [place], [place] [address] [phone]   05/14/2017, 5:05 PM

## 2017-05-17 ENCOUNTER — Encounter (HOSPITAL_BASED_OUTPATIENT_CLINIC_OR_DEPARTMENT_OTHER): Payer: Self-pay | Admitting: *Deleted

## 2017-05-17 ENCOUNTER — Emergency Department (HOSPITAL_BASED_OUTPATIENT_CLINIC_OR_DEPARTMENT_OTHER)
Admission: EM | Admit: 2017-05-17 | Discharge: 2017-05-17 | Disposition: A | Discharge status: Home / Self Care | Payer: 59 | Attending: Emergency Medicine | Admitting: Emergency Medicine

## 2017-05-17 ENCOUNTER — Other Ambulatory Visit: Payer: Self-pay

## 2017-05-17 DIAGNOSIS — I10 Essential (primary) hypertension: Secondary | ICD-10-CM

## 2017-05-17 DIAGNOSIS — Z79899 Other long term (current) drug therapy: Secondary | ICD-10-CM | POA: Insufficient documentation

## 2017-05-17 DIAGNOSIS — E039 Hypothyroidism, unspecified: Secondary | ICD-10-CM | POA: Insufficient documentation

## 2017-05-17 DIAGNOSIS — E119 Type 2 diabetes mellitus without complications: Secondary | ICD-10-CM | POA: Diagnosis not present

## 2017-05-17 DIAGNOSIS — Z7984 Long term (current) use of oral hypoglycemic drugs: Secondary | ICD-10-CM | POA: Diagnosis not present

## 2017-05-17 DIAGNOSIS — R030 Elevated blood-pressure reading, without diagnosis of hypertension: Secondary | ICD-10-CM | POA: Diagnosis not present

## 2017-05-17 DIAGNOSIS — Z7982 Long term (current) use of aspirin: Secondary | ICD-10-CM | POA: Insufficient documentation

## 2017-05-17 NOTE — ED Provider Notes (Signed)
[facility] EMERGENCY DEPARTMENT Provider Note   CSN: [id number] Arrival date & time: 05/17/17  [id number]     History   Chief Complaint No chief complaint on file.   HPI [name] is a 42 y.o. male.  HPI   42 year old male with history of diabetes, hypertension, hyperlipidemia, obesity, obstructive sleep apnea, anxiety, recurrent sinus congestion presenting for evaluation of high blood pressure.  Patient states  he had a regular  visit with his primary care provider 3 days ago.  They noted that his blood pressure was high in the 170 systolic.  He mentioned that he ran out of 1 of his blood pressure medication for several days but will resume the medicine.  He did get a refill of his medication and have been taking his medication as prescribed but states that blood pressure still high.  That got him concerned.  Furthermore he also complaining of sinus congestion for the same duration.  Endorsed occasional sneezing.  Denies any fever, chills, headache, lightheadedness, dizziness, chest pain, shortness of breath, focal numbness or weakness.  He has been using Flonase and Claritin with minimal improvement.  He denies using any other congestion medication.   Past Medical History:  Diagnosis Date  . Anxiety   . Depression   . Diabetes mellitus without complication (HCC)   . Hyperlipidemia   . Hypertension   . Hypothyroidism   . Obesity   . OSA (obstructive sleep apnea)   . Sinus congestion     Patient Active Problem List   Diagnosis Date Noted  . Essential hypertension 01/05/2016  . Vitamin D deficiency 09/15/2014  . Vitamin B12 deficiency (non anemic) 09/15/2014  . Other allergic rhinitis 01/23/2014  . OSA (obstructive sleep apnea) 12/19/2013  . Bruxism 12/19/2013  . Onychomycosis 10/21/2013  . Type 2 diabetes mellitus without complication, without long-term current use of insulin (HCC) 07/08/2013  . HLD (hyperlipidemia) 09/24/2012  . Hypothyroidism 09/24/2012  .  Morbid obesity (HCC) 09/24/2012  . Anxiety and depression 09/24/2012    Past Surgical History:  Procedure Laterality Date  . FRACTURE SURGERY     right arch  . HERNIA REPAIR  1990   Right ingruial & umbilical [place]   . LEFT HEART CATHETERIZATION WITH CORONARY ANGIOGRAM N/A 09/26/2011   Procedure: LEFT HEART CATHETERIZATION WITH CORONARY ANGIOGRAM;  Surgeon: Pamella PertJagadeesh R Ganji, MD;  [facility];  Service: Cardiovascular;  Laterality: N/A;  . Repair Right Arm Fracture  1996   [place]   . UVULOPALATOPHARYNGOPLASTY (UPPP)/TONSILLECTOMY/SEPTOPLASTY         Home Medications    Prior to Admission medications   Medication Sig Start Date End Date Taking? Authorizing Provider  amLODipine (NORVASC) 10 MG tablet Take 1 tablet (10 mg total) by mouth daily. 05/14/17   Remus LofflerJones, Angel S, PA-C  aspirin 81 MG chewable tablet Chew 81 mg by mouth daily.    [provider]  fluticasone (FLONASE) 50 MCG/ACT nasal spray Place 1 spray into both nostrils 2 (two) times daily. 08/15/16   Remus LofflerJones, Angel S, PA-C  hydrochlorothiazide (HYDRODIURIL) 25 MG tablet Take 1 tablet (25 mg total) by mouth daily. 05/14/17   Remus LofflerJones, Angel S, PA-C  levothyroxine (SYNTHROID, LEVOTHROID) 88 MCG tablet Take 1 tablet (88 mcg total) by mouth daily. 04/25/16   Remus LofflerJones, Angel S, PA-C  metFORMIN (GLUCOPHAGE) 500 MG tablet Take 1 tablet (500 mg total) by mouth 2 (two) times daily with a meal. 05/14/17   Remus LofflerJones, Angel S, PA-C  olmesartan [facility](BENICAR)  20 MG tablet Take 1 tablet (20 mg total) by mouth daily. 05/14/17   Remus Loffler, PA-C  rosuvastatin (CRESTOR) 40 MG tablet Take 1 tablet (40 mg total) by mouth daily. 05/14/17   Remus Loffler, PA-C    Family History Family History  Problem Relation Age of Onset  . Hypertension Mother   . Hypertension Father   . Heart attack Father   . Breast cancer Paternal Grandmother   . Diabetes Paternal Grandmother   . Kidney disease Maternal Grandfather   . Down syndrome Brother      Social History Social History   Tobacco Use  . Smoking status: Never Smoker  . Smokeless tobacco: Never Used  Substance Use Topics  . Alcohol use: No  . Drug use: No     Allergies   Atorvastatin and Cymbalta [duloxetine hcl]   Review of Systems Review of Systems  All other systems reviewed and are negative.    Physical Exam Updated Vital Signs BP (!) 172/106   Pulse 98   Temp 98 F (36.7 C) (Oral)   Resp 16   Ht 6' (1.829 m)   Wt (!) 144.2 kg (318 lb)   SpO2 100%   BMI 43.13 kg/m   Physical Exam  Constitutional: He appears well-developed and well-nourished. No distress.  HENT:  Head: Atraumatic.  Right Ear: External ear normal.  Left Ear: External ear normal.  Nose: Nose normal.  Mouth/Throat: Oropharynx is clear and moist.  Eyes: Conjunctivae are normal.  Neck: Normal range of motion. Neck supple. No JVD present.  Cardiovascular: Normal rate and regular rhythm.  Pulmonary/Chest: Effort normal and breath sounds normal. No stridor. No respiratory distress. He has no wheezes. He has no rales. He exhibits no tenderness.  Abdominal: Soft. Bowel sounds are normal. He exhibits no distension. There is no tenderness.  Musculoskeletal: He exhibits no edema.  Neurological: He is alert.  Skin: No rash noted.  Psychiatric: He has a normal mood and affect.  Nursing note and vitals reviewed.    ED Treatments / Results  Labs (all labs ordered are listed, but only abnormal results are displayed) Labs Reviewed - No data to display  EKG  EKG Interpretation None       Radiology No results found.  Procedures Procedures (including critical care time)  Medications Ordered in ED Medications - No data to display   Initial Impression / Assessment and Plan / ED Course  I have reviewed the triage vital signs and the nursing notes.  Pertinent labs & imaging results that were available during my care of the patient were reviewed by me and considered in my  medical decision making (see chart for details).     BP (!) 153/98 (BP Location: Left Arm)   Pulse 86   Temp 98 F (36.7 C) (Oral)   Resp 18   Ht 6' (1.829 m)   Wt (!) 144.2 kg (318 lb)   SpO2 100%   BMI 43.13 kg/m    Final Clinical Impressions(s) / ED Diagnoses   Final diagnoses:  Asymptomatic hypertension    ED Discharge Orders    None     8:05 PM Patient states that his blood pressure has been high since he was told that he has high blood pressure 3 days ago at his PCP office on a regular checkup.  He is now resuming his blood pressure medication but still concerned because blood pressures remain high.  He does not have symptoms to suggest hypertensive  emergency causing endorgan damage.  He does not having active chest pain or trouble breathing.  His initial blood pressure is 172/106.  He has been complaining of sinus congestion but denies taking any decongestant medication.  These medication can cause high blood pressure.  8:45 PM Blood pressure shows an improvement in blood pressure without any specific treatment.  Patient is well-appearing.  I encourage patient to continue taking his blood pressure medication and follow-up with his PCP next week for recheck.  Encourage patient to also focus on a DASH diet as well as regular exercise including 30 minutes brisk walk at least 3 times a week to help with his hypertension.  Return precautions discussed.  Patient voiced understanding and agrees with plan.   Fayrene Helper, PA-C 05/17/17 2047    Tegeler, Canary Brim, MD 05/18/17 [phone]

## 2017-05-17 NOTE — ED Triage Notes (Signed)
His BP has been elevated. He went to his MD and it was high. He had stopped taking his BP medication for a few days. She called the medication Rx to his pharmacy. He has been taking his medication for the past 2 days. He also thinks he has a sinus infection.

## 2017-06-11 ENCOUNTER — Encounter: Payer: Self-pay | Admitting: Physician Assistant

## 2017-06-11 ENCOUNTER — Ambulatory Visit (INDEPENDENT_AMBULATORY_CARE_PROVIDER_SITE_OTHER): Payer: 59 | Admitting: Physician Assistant

## 2017-06-11 VITALS — BP 143/94 | HR 90 | Ht 72.0 in | Wt 317.4 lb

## 2017-06-11 DIAGNOSIS — E119 Type 2 diabetes mellitus without complications: Secondary | ICD-10-CM

## 2017-06-11 DIAGNOSIS — E039 Hypothyroidism, unspecified: Secondary | ICD-10-CM

## 2017-06-11 DIAGNOSIS — E1165 Type 2 diabetes mellitus with hyperglycemia: Secondary | ICD-10-CM | POA: Insufficient documentation

## 2017-06-11 DIAGNOSIS — E1121 Type 2 diabetes mellitus with diabetic nephropathy: Secondary | ICD-10-CM | POA: Diagnosis not present

## 2017-06-11 LAB — BAYER DCA HB A1C WAIVED: HB A1C (BAYER DCA - WAIVED): 7.2 % — ABNORMAL HIGH (ref <7.0)

## 2017-06-11 NOTE — Patient Instructions (Signed)
In a few days you may receive a survey in the mail or online from [employer] regarding your visit with us today. Please take a moment to fill this out. Your feedback is very important to our whole office. It can help us better understand your needs as well as improve your experience and satisfaction. Thank you for taking your time to complete it. We care about you.  Hanin [place], PA-C  

## 2017-06-12 NOTE — Progress Notes (Signed)
BP (!) 143/94   Pulse 90   Ht 6' (1.829 m)   Wt (!) 317 lb 6.4 oz (144 kg)   BMI 43.05 kg/m    Subjective:    Patient ID: [name], male    DOB: 1976-01-06, 42 y.o.   MRN: [id number]  HPI: [name] is a 42 y.o. male presenting on 06/11/2017 for Follow-up (4 week rck htn)  Comes in for a one-month recheck on blood pressure.  He states he is doing better with his diet and exercise.  He has been walking about 3 times a week from 20-30 minutes at a time.  We are due to have labs performed today.  We have discussed that his blood pressure medicine is in a group that has been recalled.  He has had an ACE cough with lisinopril before.  Past Medical History:  Diagnosis Date  . Anxiety   . Depression   . Diabetes mellitus without complication (HCC)   . Hyperlipidemia   . Hypertension   . Hypothyroidism   . Obesity   . OSA (obstructive sleep apnea)   . Sinus congestion    Relevant past medical, surgical, family and social history reviewed and updated as indicated. Interim medical history since our last visit reviewed. Allergies and medications reviewed and updated. DATA REVIEWED: CHART IN EPIC  Family History reviewed for pertinent findings.  Review of Systems  Constitutional: Negative.  Negative for appetite change and fatigue.  HENT: Negative.   Eyes: Negative.  Negative for pain and visual disturbance.  Respiratory: Negative.  Negative for cough, chest tightness, shortness of breath and wheezing.   Cardiovascular: Negative.  Negative for chest pain, palpitations and leg swelling.  Gastrointestinal: Negative.  Negative for abdominal pain, diarrhea, nausea and vomiting.  Endocrine: Negative.   Genitourinary: Negative.   Musculoskeletal: Negative.   Skin: Negative.  Negative for color change and rash.  Neurological: Negative.  Negative for weakness, numbness and headaches.  Psychiatric/Behavioral: Negative.     Allergies as of 06/11/2017      Reactions   Atorvastatin Other (See Comments)   Elevated liver enzymes   Lisinopril Cough   Cymbalta [duloxetine Hcl] Rash      Medication List        Accurate as of 06/11/17 11:59 PM. Always use your most recent med list.          amLODipine 10 MG tablet Commonly known as:  NORVASC Take 1 tablet (10 mg total) by mouth daily.   aspirin 81 MG chewable tablet Chew 81 mg by mouth daily.   fluticasone 50 MCG/ACT nasal spray Commonly known as:  FLONASE Place 1 spray into both nostrils 2 (two) times daily.   hydrochlorothiazide 25 MG tablet Commonly known as:  HYDRODIURIL Take 1 tablet (25 mg total) by mouth daily.   levothyroxine 88 MCG tablet Commonly known as:  SYNTHROID, LEVOTHROID Take 1 tablet (88 mcg total) by mouth daily.   metFORMIN 500 MG tablet Commonly known as:  GLUCOPHAGE Take 1 tablet (500 mg total) by mouth 2 (two) times daily with a meal.   olmesartan 20 MG tablet Commonly known as:  BENICAR Take 1 tablet (20 mg total) by mouth daily.   rosuvastatin 40 MG tablet Commonly known as:  CRESTOR Take 1 tablet (40 mg total) by mouth daily.          Objective:    BP (!) 143/94   Pulse 90   Ht 6' (1.829 m)  Wt (!) 317 lb 6.4 oz (144 kg)   BMI 43.05 kg/m   Allergies  Allergen Reactions  . Atorvastatin Other (See Comments)    Elevated liver enzymes  . Lisinopril Cough  . Cymbalta [Duloxetine Hcl] Rash    Wt Readings from Last 3 Encounters:  06/11/17 (!) 317 lb 6.4 oz (144 kg)  05/17/17 (!) 318 lb (144.2 kg)  05/14/17 (!) 318 lb 9.6 oz (144.5 kg)    Physical Exam  Constitutional: He appears well-developed and well-nourished. No distress.  HENT:  Head: Normocephalic and atraumatic.  Eyes: Pupils are equal, round, and reactive to light. Conjunctivae and EOM are normal.  Cardiovascular: Normal rate, regular rhythm and normal heart sounds.  Pulmonary/Chest: Effort normal and breath sounds normal. No respiratory distress.  Skin: Skin is warm and dry.    Psychiatric: He has a normal mood and affect. His behavior is normal.  Nursing note and vitals reviewed.   Results for orders placed or performed in visit on 06/11/17  Bayer DCA Hb A1c Waived  Result Value Ref Range   Bayer DCA Hb A1c Waived 7.2 (H) <7.0 %      Assessment & Plan:   1. Hypothyroidism, unspecified type  2. Type 2 diabetes mellitus without complication, without long-term current use of insulin (HCC) - Bayer DCA Hb A1c Waived  3. Controlled type 2 diabetes mellitus with diabetic nephropathy, without long-term current use of insulin (HCC)  4. Morbid obesity (HCC)   Continue all other maintenance medications as listed above.  Follow up plan: Return in about 2 months (around 08/11/2017) for recheck.  Educational handout given for survey  Remus Loffler PA-C [facility] [address]  [place], [place] [address] [phone]   06/12/2017, 11:29 AM

## 2017-06-19 ENCOUNTER — Encounter: Payer: Self-pay | Admitting: *Deleted

## 2017-07-04 MED FILL — OLMESARTAN MEDOXOMIL 20 MG: 20 | 30 days supply | Qty: 30 | Fill #1

## 2017-08-08 ENCOUNTER — Other Ambulatory Visit: Payer: Self-pay | Admitting: Physician Assistant

## 2017-08-08 MED FILL — OLMESARTAN MEDOXOMIL 20 MG: 20 | 30 days supply | Qty: 30 | Fill #2

## 2017-08-08 MED FILL — AMLODIPINE BESYLATE 10 MG T: 10 | 90 days supply | Qty: 90 | Fill #1

## 2017-08-09 MED FILL — LEVOTHYROXINE 88 MCG TABLET: 88 | 90 days supply | Qty: 90 | Fill #0

## 2017-08-13 ENCOUNTER — Ambulatory Visit (INDEPENDENT_AMBULATORY_CARE_PROVIDER_SITE_OTHER): Payer: 59 | Admitting: Physician Assistant

## 2017-08-13 ENCOUNTER — Encounter: Payer: Self-pay | Admitting: Physician Assistant

## 2017-08-13 VITALS — BP 143/86 | HR 90 | Ht 72.0 in | Wt 308.0 lb

## 2017-08-13 DIAGNOSIS — E039 Hypothyroidism, unspecified: Secondary | ICD-10-CM

## 2017-08-13 DIAGNOSIS — I10 Essential (primary) hypertension: Secondary | ICD-10-CM

## 2017-08-13 DIAGNOSIS — E119 Type 2 diabetes mellitus without complications: Secondary | ICD-10-CM | POA: Diagnosis not present

## 2017-08-13 DIAGNOSIS — J0110 Acute frontal sinusitis, unspecified: Secondary | ICD-10-CM

## 2017-08-13 LAB — BAYER DCA HB A1C WAIVED: HB A1C: 6.6 % (ref <7.0)

## 2017-08-13 MED ORDER — AMOXICILLIN 500 MG PO CAPS: 500.0000 mg | ORAL_CAPSULE | Freq: Three times a day (TID) | ORAL | 0 refills | Status: DC

## 2017-08-13 NOTE — Patient Instructions (Signed)
In a few days you may receive a survey in the mail or online from [employer] regarding your visit with us today. Please take a moment to fill this out. Your feedback is very important to our whole office. It can help us better understand your needs as well as improve your experience and satisfaction. Thank you for taking your time to complete it. We care about you.  Curtistine Pettitt, PA-C  

## 2017-08-13 NOTE — Progress Notes (Signed)
BP (!) 143/86   Pulse 90   Ht 6' (1.829 m)   Wt (!) 308 lb (139.7 kg)   BMI 41.77 kg/m    Subjective:    Patient ID: [name], male    DOB: [birth year], 42 y.o.   MRN: [id number]  HPI: [name] is a 42 y.o. male presenting on 08/13/2017 for Hypothyroidism; Diabetes; Hyperlipidemia; and Hypertension  This patient comes in for a chronic recheck on his chronic medical conditions which do include hypothyroidism, diabetes hyperlipidemia and hypertension.  Labs will be drawn soon.  He reports that he is only been taking 1 metformin a day but in recent weeks had increase it to twice daily.  He is tolerating the medication very well.  He is currently trying to reduce his intake of carbs.  He does need labs to check his thyroid.  He has not had many adjustments over the years.  He has had hypothyroidism since his early 3s.  He also is having significant problems with his sinuses and congestion This patient has had many days of sinus headache and postnasal drainage. There is copious drainage at times. Denies any fever at this time. There has been a history of sinus infections in the past.  No history of sinus surgery. There is cough at night. It has become more prevalent in recent days.   Past Medical History:  Diagnosis Date  . Anxiety   . Depression   . Diabetes mellitus without complication ([place])   . Hyperlipidemia   . Hypertension   . Hypothyroidism   . Obesity   . OSA (obstructive sleep apnea)   . Sinus congestion    Relevant past medical, surgical, family and social history reviewed and updated as indicated. Interim medical history since our last visit reviewed. Allergies and medications reviewed and updated. DATA REVIEWED: CHART IN EPIC  Family History reviewed for pertinent findings.  Review of Systems  Constitutional: Positive for fatigue. Negative for appetite change.  HENT: Positive for sinus pressure and sore throat.   Eyes: Negative.  Negative for pain and  visual disturbance.  Respiratory: Positive for shortness of breath and wheezing. Negative for cough and chest tightness.   Cardiovascular: Negative.  Negative for chest pain, palpitations and leg swelling.  Gastrointestinal: Negative.  Negative for abdominal pain, diarrhea, nausea and vomiting.  Endocrine: Negative.   Genitourinary: Negative.   Musculoskeletal: Positive for back pain and myalgias.  Skin: Negative.  Negative for color change and rash.  Neurological: Positive for headaches. Negative for weakness and numbness.  Psychiatric/Behavioral: Negative.     Allergies as of 08/13/2017      Reactions   Atorvastatin Other (See Comments)   Elevated liver enzymes   Lisinopril Cough   Cymbalta [duloxetine Hcl] Rash      Medication List        Accurate as of 08/13/17  4:42 PM. Always use your most recent med list.          amLODipine 10 MG tablet Commonly known as:  NORVASC Take 1 tablet (10 mg total) by mouth daily.   amoxicillin 500 MG capsule Commonly known as:  AMOXIL Take 1 capsule (500 mg total) by mouth 3 (three) times daily.   aspirin 81 MG chewable tablet Chew 81 mg by mouth daily.   fluticasone 50 MCG/ACT nasal spray Commonly known as:  FLONASE Place 1 spray into both nostrils 2 (two) times daily.   hydrochlorothiazide 25 MG tablet Commonly known as:  HYDRODIURIL Take  1 tablet (25 mg total) by mouth daily.   levothyroxine 88 MCG tablet Commonly known as:  SYNTHROID, LEVOTHROID TAKE 1 TABLET (88 MCG TOTAL) BY MOUTH DAILY.   metFORMIN 500 MG tablet Commonly known as:  GLUCOPHAGE Take 1 tablet (500 mg total) by mouth 2 (two) times daily with a meal.   olmesartan 20 MG tablet Commonly known as:  BENICAR Take 1 tablet (20 mg total) by mouth daily.   rosuvastatin 40 MG tablet Commonly known as:  CRESTOR Take 1 tablet (40 mg total) by mouth daily.          Objective:    BP (!) 143/86   Pulse 90   Ht 6' (1.829 m)   Wt (!) 308 lb (139.7 kg)   BMI  41.77 kg/m   Allergies  Allergen Reactions  . Atorvastatin Other (See Comments)    Elevated liver enzymes  . Lisinopril Cough  . Cymbalta [Duloxetine Hcl] Rash    Wt Readings from Last 3 Encounters:  08/13/17 (!) 308 lb (139.7 kg)  06/11/17 (!) 317 lb 6.4 oz (144 kg)  05/17/17 (!) 318 lb (144.2 kg)    Physical Exam  Constitutional: He is oriented to person, place, and time. He appears well-developed and well-nourished.  HENT:  Head: Normocephalic and atraumatic.  Right Ear: Tympanic membrane and external ear normal. No middle ear effusion.  Left Ear: Tympanic membrane and external ear normal.  No middle ear effusion.  Nose: Mucosal edema and rhinorrhea present. Right sinus exhibits no maxillary sinus tenderness. Left sinus exhibits no maxillary sinus tenderness.  Mouth/Throat: Uvula is midline. Posterior oropharyngeal erythema present.  Eyes: Pupils are equal, round, and reactive to light. Conjunctivae and EOM are normal. Right eye exhibits no discharge. Left eye exhibits no discharge.  Neck: Normal range of motion.  Cardiovascular: Normal rate, regular rhythm and normal heart sounds.  Pulmonary/Chest: Effort normal and breath sounds normal. No respiratory distress. He has no wheezes.  Abdominal: Soft.  Lymphadenopathy:    He has no cervical adenopathy.  Neurological: He is alert and oriented to person, place, and time.  Skin: Skin is warm and dry.  Psychiatric: He has a normal mood and affect.    Results for orders placed or performed in visit on 06/11/17  Bayer DCA Hb A1c Waived  Result Value Ref Range   HB A1C (BAYER DCA - WAIVED) 7.2 (H) <7.0 %      Assessment & Plan:   1. Hypothyroidism, unspecified type - TSH  2. Type 2 diabetes mellitus without complication, without long-term current use of insulin (HCC) - CBC with Differential/Platelet - CMP14+EGFR - Bayer DCA Hb A1c Waived  3. Essential hypertension Continue medications  4. Acute non-recurrent frontal  sinusitis - amoxicillin (AMOXIL) 500 MG capsule; Take 1 capsule (500 mg total) by mouth 3 (three) times daily.  Dispense: 30 capsule; Refill: 0   Continue all other maintenance medications as listed above.  Follow up plan: Return in about 3 months (around 11/13/2017) for recheck and labs.  Educational handout given for [place] PA-C [facility] [address]  [place], [place] [address] [phone]   08/13/2017, 4:42 PM

## 2017-08-14 ENCOUNTER — Other Ambulatory Visit: Payer: Self-pay | Admitting: Physician Assistant

## 2017-08-14 LAB — CBC WITH DIFFERENTIAL/PLATELET
BASOS ABS: 0 10*3/uL (ref 0.0–0.2)
Basos: 0 %
EOS (ABSOLUTE): 0.1 10*3/uL (ref 0.0–0.4)
Eos: 2 %
HEMOGLOBIN: 14.6 g/dL (ref 13.0–17.7)
Hematocrit: 43.3 % (ref 37.5–51.0)
IMMATURE GRANS (ABS): 0 10*3/uL (ref 0.0–0.1)
Immature Granulocytes: 0 %
LYMPHS: 49 %
Lymphocytes Absolute: 3 10*3/uL (ref 0.7–3.1)
MCH: 27.1 pg (ref 26.6–33.0)
MCHC: 33.7 g/dL (ref 31.5–35.7)
MCV: 81 fL (ref 79–97)
MONOCYTES: 13 %
Monocytes Absolute: 0.8 10*3/uL (ref 0.1–0.9)
NEUTROS PCT: 36 %
Neutrophils Absolute: 2.3 10*3/uL (ref 1.4–7.0)
PLATELETS: 310 10*3/uL (ref 150–450)
RBC: 5.38 x10E6/uL (ref 4.14–5.80)
RDW: 16.1 % — ABNORMAL HIGH (ref 12.3–15.4)
WBC: 6.2 10*3/uL (ref 3.4–10.8)

## 2017-08-14 LAB — CMP14+EGFR
ALBUMIN: 4.4 g/dL (ref 3.5–5.5)
ALT: 23 IU/L (ref 0–44)
AST: 17 IU/L (ref 0–40)
Albumin/Globulin Ratio: 1.5 (ref 1.2–2.2)
Alkaline Phosphatase: 56 IU/L (ref 39–117)
BUN / CREAT RATIO: 15 (ref 9–20)
BUN: 19 mg/dL (ref 6–20)
Bilirubin Total: 0.2 mg/dL (ref 0.0–1.2)
CO2: 27 mmol/L (ref 20–29)
CREATININE: 1.26 mg/dL (ref 0.76–1.27)
Calcium: 9.6 mg/dL (ref 8.7–10.2)
Chloride: 95 mmol/L — ABNORMAL LOW (ref 96–106)
GFR calc non Af Amer: 71 mL/min/{1.73_m2} (ref >59)
GFR, EST AFRICAN AMERICAN: 83 mL/min/{1.73_m2} (ref >59)
GLUCOSE: 82 mg/dL (ref 65–99)
Globulin, Total: 3 g/dL (ref 1.5–4.5)
Potassium: 4.1 mmol/L (ref 3.5–5.2)
Sodium: 139 mmol/L (ref 134–144)
TOTAL PROTEIN: 7.4 g/dL (ref 6.0–8.5)

## 2017-08-14 LAB — TSH: TSH: 3.53 u[IU]/mL (ref 0.450–4.500)

## 2017-08-14 MED ORDER — LEVOTHYROXINE SODIUM 88 MCG PO TABS: 88.0000 ug | ORAL_TABLET | Freq: Every day | ORAL | 3 refills | Status: DC

## 2017-08-16 MED FILL — AMOXICILLIN 500 MG CAPSULE: 500 | 10 days supply | Qty: 30 | Fill #0

## 2017-09-14 ENCOUNTER — Telehealth: Payer: Self-pay | Admitting: Physician Assistant

## 2017-09-14 NOTE — Telephone Encounter (Signed)
Needs to be seen

## 2017-09-14 NOTE — Telephone Encounter (Signed)
Called pt: Started early  In the week - doing some outside deck work. He felt some light-headed, ears stopped up, nasal stuffiness, congestion, sinus pressure Using flonase and zyrtec Wants a antibiotic to kick this.  [facility] [place]  Also  Having increased indigestion  Recommended otc zantac 150mg  1-2 daily as needed.

## 2017-09-14 NOTE — Telephone Encounter (Signed)
Aware NTBS By detailed VM

## 2017-09-21 ENCOUNTER — Ambulatory Visit (INDEPENDENT_AMBULATORY_CARE_PROVIDER_SITE_OTHER): Payer: 59 | Admitting: Physician Assistant

## 2017-09-21 ENCOUNTER — Encounter: Payer: Self-pay | Admitting: Physician Assistant

## 2017-09-21 VITALS — BP 129/92 | HR 80 | Temp 97.6°F | Ht 72.0 in | Wt 306.2 lb

## 2017-09-21 DIAGNOSIS — Z8709 Personal history of other diseases of the respiratory system: Secondary | ICD-10-CM

## 2017-09-21 DIAGNOSIS — I10 Essential (primary) hypertension: Secondary | ICD-10-CM | POA: Diagnosis not present

## 2017-09-21 DIAGNOSIS — J329 Chronic sinusitis, unspecified: Secondary | ICD-10-CM | POA: Diagnosis not present

## 2017-09-21 MED ORDER — OLMESARTAN MEDOXOMIL 20 MG PO TABS: 20.0000 mg | ORAL_TABLET | Freq: Every day | ORAL | 3 refills | Status: DC

## 2017-09-21 MED ORDER — AMOXICILLIN-POT CLAVULANATE 875-125 MG PO TABS: 1.0000 | ORAL_TABLET | Freq: Two times a day (BID) | ORAL | 0 refills | Status: DC

## 2017-09-21 MED FILL — AMOX-CLAV 875-125 MG TABLET: 875-125 | 10 days supply | Qty: 20 | Fill #0

## 2017-09-21 MED FILL — OLMESARTAN MEDOXOMIL 20 MG: 20 | 90 days supply | Qty: 90 | Fill #0

## 2017-09-21 NOTE — Patient Instructions (Signed)
In a few days you may receive a survey in the mail or online from [employer] regarding your visit with us today. Please take a moment to fill this out. Your feedback is very important to our whole office. It can help us better understand your needs as well as improve your experience and satisfaction. Thank you for taking your time to complete it. We care about you.  Constant [place], PA-C  

## 2017-09-24 DIAGNOSIS — Z8709 Personal history of other diseases of the respiratory system: Secondary | ICD-10-CM | POA: Insufficient documentation

## 2017-09-24 NOTE — Progress Notes (Signed)
BP (!) 129/92   Pulse 80   Temp 97.6 F (36.4 C) (Oral)   Ht 6' (1.829 m)   Wt (!) 306 lb 3.2 oz (138.9 kg)   BMI 41.53 kg/m    Subjective:    Patient ID: [name], male    DOB: 10-19-1975, 42 y.o.   MRN: [id number]  HPI: [name] is a 42 y.o. male presenting on 09/21/2017 for Chest Pain (Saturday- He just lost his best Friend and is having a hard time ) and Stress This patient has had many days of sinus headache and postnasal drainage. There is copious drainage at times. Denies any fever at this time. There has been a history of sinus infections in the past.  No history of sinus surgery. There is cough at night. It has become more prevalent in recent days.  He also has a lot of stress and has been depressed related to the death of a close friend. Some BP elevation has happened.  Past Medical History:  Diagnosis Date  . Anxiety   . Depression   . Diabetes mellitus without complication ([place])   . Hyperlipidemia   . Hypertension   . Hypothyroidism   . Obesity   . OSA (obstructive sleep apnea)   . Sinus congestion    Relevant past medical, surgical, family and social history reviewed and updated as indicated. Interim medical history since our last visit reviewed. Allergies and medications reviewed and updated. DATA REVIEWED: CHART IN EPIC  Family History reviewed for pertinent findings.  Review of Systems  Constitutional: Positive for fatigue. Negative for appetite change.  HENT: Positive for sinus pressure and sore throat.   Eyes: Negative.  Negative for pain and visual disturbance.  Respiratory: Positive for shortness of breath and wheezing. Negative for cough and chest tightness.   Cardiovascular: Negative.  Negative for chest pain, palpitations and leg swelling.  Gastrointestinal: Negative.  Negative for abdominal pain, diarrhea, nausea and vomiting.  Endocrine: Negative.   Genitourinary: Negative.   Musculoskeletal: Positive for back pain and myalgias.    Skin: Negative.  Negative for color change and rash.  Neurological: Positive for headaches. Negative for weakness and numbness.  Psychiatric/Behavioral: Negative.     Allergies as of 09/21/2017      Reactions   Atorvastatin Other (See Comments)   Elevated liver enzymes   Lisinopril Cough   Cymbalta [duloxetine Hcl] Rash      Medication List        Accurate as of 09/21/17 11:59 PM. Always use your most recent med list.          amLODipine 10 MG tablet Commonly known as:  NORVASC Take 1 tablet (10 mg total) by mouth daily.   amoxicillin-clavulanate 875-125 MG tablet Commonly known as:  AUGMENTIN Take 1 tablet by mouth 2 (two) times daily.   aspirin 81 MG chewable tablet Chew 81 mg by mouth daily.   fluticasone 50 MCG/ACT nasal spray Commonly known as:  FLONASE Place 1 spray into both nostrils 2 (two) times daily.   hydrochlorothiazide 25 MG tablet Commonly known as:  HYDRODIURIL Take 1 tablet (25 mg total) by mouth daily.   levothyroxine 88 MCG tablet Commonly known as:  SYNTHROID, LEVOTHROID Take 1 tablet (88 mcg total) by mouth daily.   metFORMIN 500 MG tablet Commonly known as:  GLUCOPHAGE Take 1 tablet (500 mg total) by mouth 2 (two) times daily with a meal.   olmesartan 20 MG tablet Commonly known as:  [employer]  Take 1 tablet (20 mg total) by mouth daily.   rosuvastatin 40 MG tablet Commonly known as:  CRESTOR Take 1 tablet (40 mg total) by mouth daily.          Objective:    BP (!) 129/92   Pulse 80   Temp 97.6 F (36.4 C) (Oral)   Ht 6' (1.829 m)   Wt (!) 306 lb 3.2 oz (138.9 kg)   BMI 41.53 kg/m   Allergies  Allergen Reactions  . Atorvastatin Other (See Comments)    Elevated liver enzymes  . Lisinopril Cough  . Cymbalta [Duloxetine Hcl] Rash    Wt Readings from Last 3 Encounters:  09/21/17 (!) 306 lb 3.2 oz (138.9 kg)  08/13/17 (!) 308 lb (139.7 kg)  06/11/17 (!) 317 lb 6.4 oz (144 kg)    Physical Exam  Constitutional: He is  oriented to person, place, and time. He appears well-developed and well-nourished.  HENT:  Head: Normocephalic and atraumatic.  Right Ear: Tympanic membrane and external ear normal. No middle ear effusion.  Left Ear: Tympanic membrane and external ear normal.  No middle ear effusion.  Nose: Mucosal edema and rhinorrhea present. Right sinus exhibits no maxillary sinus tenderness. Left sinus exhibits no maxillary sinus tenderness.  Mouth/Throat: Uvula is midline. Posterior oropharyngeal erythema present.  Eyes: Pupils are equal, round, and reactive to light. Conjunctivae and EOM are normal. Right eye exhibits no discharge. Left eye exhibits no discharge.  Neck: Normal range of motion.  Cardiovascular: Normal rate, regular rhythm and normal heart sounds.  Pulmonary/Chest: Effort normal and breath sounds normal. No respiratory distress. He has no wheezes.  Abdominal: Soft.  Lymphadenopathy:    He has no cervical adenopathy.  Neurological: He is alert and oriented to person, place, and time.  Skin: Skin is warm and dry.  Psychiatric: He has a normal mood and affect.    Results for orders placed or performed in visit on 08/13/17  CBC with Differential/Platelet  Result Value Ref Range   WBC 6.2 3.4 - 10.8 x10E3/uL   RBC 5.38 4.14 - 5.80 x10E6/uL   Hemoglobin 14.6 13.0 - 17.7 g/dL   Hematocrit 43.3 37.5 - 51.0 %   MCV 81 79 - 97 fL   MCH 27.1 26.6 - 33.0 pg   MCHC 33.7 31.5 - 35.7 g/dL   RDW 16.1 (H) 12.3 - 15.4 %   Platelets 310 150 - 450 x10E3/uL   Neutrophils 36 Not Estab. %   Lymphs 49 Not Estab. %   Monocytes 13 Not Estab. %   Eos 2 Not Estab. %   Basos 0 Not Estab. %   Neutrophils Absolute 2.3 1.4 - 7.0 x10E3/uL   Lymphocytes Absolute 3.0 0.7 - 3.1 x10E3/uL   Monocytes Absolute 0.8 0.1 - 0.9 x10E3/uL   EOS (ABSOLUTE) 0.1 0.0 - 0.4 x10E3/uL   Basophils Absolute 0.0 0.0 - 0.2 x10E3/uL   Immature Granulocytes 0 Not Estab. %   Immature Grans (Abs) 0.0 0.0 - 0.1 x10E3/uL    CMP14+EGFR  Result Value Ref Range   Glucose 82 65 - 99 mg/dL   BUN 19 6 - 20 mg/dL   Creatinine, Ser 1.26 0.76 - 1.27 mg/dL   GFR calc non Af Amer 71 >59 mL/min/1.73   GFR calc Af Amer 83 >59 mL/min/1.73   BUN/Creatinine Ratio 15 9 - 20   Sodium 139 134 - 144 mmol/L   Potassium 4.1 3.5 - 5.2 mmol/L   Chloride 95 (L) 96 - 106  mmol/L   CO2 27 20 - 29 mmol/L   Calcium 9.6 8.7 - 10.2 mg/dL   Total Protein 7.4 6.0 - 8.5 g/dL   Albumin 4.4 3.5 - 5.5 g/dL   Globulin, Total 3.0 1.5 - 4.5 g/dL   Albumin/Globulin Ratio 1.5 1.2 - 2.2   Bilirubin Total 0.2 0.0 - 1.2 mg/dL   Alkaline Phosphatase 56 39 - 117 IU/L   AST 17 0 - 40 IU/L   ALT 23 0 - 44 IU/L  TSH  Result Value Ref Range   TSH 3.530 0.450 - 4.500 uIU/mL  Bayer DCA Hb A1c Waived  Result Value Ref Range   HB A1C (BAYER DCA - WAIVED) 6.6 <7.0 %      Assessment & Plan:   1. Sinusitis, unspecified chronicity, unspecified location - Ambulatory referral to ENT - amoxicillin-clavulanate (AUGMENTIN) 875-125 MG tablet; Take 1 tablet by mouth 2 (two) times daily.  Dispense: 20 tablet; Refill: 0  2. History of deviated nasal septum - Ambulatory referral to ENT  3. HTN (hypertension), benign - olmesartan (BENICAR) 20 MG tablet; Take 1 tablet (20 mg total) by mouth daily.  Dispense: 90 tablet; Refill: 3   Continue all other maintenance medications as listed above.  Follow up plan: No follow-ups on file.  Educational handout given for [place] PA-C [place] [address]  [place], [place] [address] [phone]   09/24/2017, 9:51 PM

## 2017-11-02 DIAGNOSIS — J342 Deviated nasal septum: Secondary | ICD-10-CM | POA: Diagnosis not present

## 2017-11-02 DIAGNOSIS — J310 Chronic rhinitis: Secondary | ICD-10-CM | POA: Diagnosis not present

## 2017-11-02 DIAGNOSIS — J343 Hypertrophy of nasal turbinates: Secondary | ICD-10-CM | POA: Diagnosis not present

## 2017-11-07 DIAGNOSIS — H5213 Myopia, bilateral: Secondary | ICD-10-CM | POA: Diagnosis not present

## 2017-11-14 ENCOUNTER — Ambulatory Visit (INDEPENDENT_AMBULATORY_CARE_PROVIDER_SITE_OTHER): Payer: 59 | Admitting: Physician Assistant

## 2017-11-14 ENCOUNTER — Encounter: Payer: Self-pay | Admitting: Physician Assistant

## 2017-11-14 ENCOUNTER — Telehealth: Payer: Self-pay | Admitting: Physician Assistant

## 2017-11-14 VITALS — BP 132/88 | HR 98 | Temp 98.2°F | Ht 72.0 in | Wt 305.4 lb

## 2017-11-14 DIAGNOSIS — E119 Type 2 diabetes mellitus without complications: Secondary | ICD-10-CM

## 2017-11-14 DIAGNOSIS — L209 Atopic dermatitis, unspecified: Secondary | ICD-10-CM | POA: Diagnosis not present

## 2017-11-14 LAB — BAYER DCA HB A1C WAIVED: HB A1C: 6.9 % (ref <7.0)

## 2017-11-14 MED ORDER — BETAMETHASONE DIPROPIONATE 0.05 % EX OINT: TOPICAL_OINTMENT | Freq: Two times a day (BID) | CUTANEOUS | 5 refills | Status: DC

## 2017-11-16 NOTE — Progress Notes (Signed)
BP 132/88   Pulse 98   Temp 98.2 F (36.8 C) (Oral)   Ht 6' (1.829 m)   Wt (!) 305 lb 6.4 oz (138.5 kg)   BMI 41.42 kg/m    Subjective:    Patient ID: [name], male    DOB: 1975/10/05, 42 y.o.   MRN: [id number]  HPI: [name] is a 42 y.o. male presenting on 11/14/2017 for Hypothyroidism (3 month ); Diabetes; Hyperlipidemia; and Hypertension  This patient comes in for periodic recheck on medications and conditions including type 2 diabetes controlled, atopic dermatitis flare. Marland Kitchen He reports that his skin has been flared in the past couple weeks.  In the summertime sometimes his elbows and NyQuil have a flareup of the rash.  He does need a refill at this time.  Otherwise he is doing well with his diabetes.  His A1c was about the same.  He is encouraged to continue with his diet and exercise and weight loss routine.  And will recheck in 3 months.  All medications are reviewed today. There are no reports of any problems with the medications. All of the medical conditions are reviewed and updated.  Lab work is reviewed and will be ordered as medically necessary. There are no new problems reported with today's visit.   Past Medical History:  Diagnosis Date  . Anxiety   . Depression   . Diabetes mellitus without complication (HCC)   . Hyperlipidemia   . Hypertension   . Hypothyroidism   . Obesity   . OSA (obstructive sleep apnea)   . Sinus congestion    Relevant past medical, surgical, family and social history reviewed and updated as indicated. Interim medical history since our last visit reviewed. Allergies and medications reviewed and updated. DATA REVIEWED: CHART IN EPIC  Family History reviewed for pertinent findings.  Review of Systems  Constitutional: Negative.  Negative for appetite change and fatigue.  HENT: Negative.   Eyes: Negative.  Negative for pain and visual disturbance.  Respiratory: Negative.  Negative for cough, chest tightness, shortness of breath  and wheezing.   Cardiovascular: Negative.  Negative for chest pain, palpitations and leg swelling.  Gastrointestinal: Negative.  Negative for abdominal pain, diarrhea, nausea and vomiting.  Endocrine: Negative.   Genitourinary: Negative.   Musculoskeletal: Positive for arthralgias.  Skin: Positive for rash. Negative for color change.  Neurological: Negative.  Negative for weakness, numbness and headaches.  Psychiatric/Behavioral: Negative.     Allergies as of 11/14/2017      Reactions   Atorvastatin Other (See Comments)   Elevated liver enzymes   Lisinopril Cough   Cymbalta [duloxetine Hcl] Rash      Medication List        Accurate as of 11/14/17 11:59 PM. Always use your most recent med list.          amLODipine 10 MG tablet Commonly known as:  NORVASC Take 1 tablet (10 mg total) by mouth daily.   aspirin 81 MG chewable tablet Chew 81 mg by mouth daily.   betamethasone dipropionate 0.05 % ointment Commonly known as:  DIPROLENE Apply topically 2 (two) times daily.   fluticasone 50 MCG/ACT nasal spray Commonly known as:  FLONASE Place 1 spray into both nostrils 2 (two) times daily.   hydrochlorothiazide 25 MG tablet Commonly known as:  HYDRODIURIL Take 1 tablet (25 mg total) by mouth daily.   levothyroxine 88 MCG tablet Commonly known as:  SYNTHROID, LEVOTHROID Take 1 tablet (88 mcg total)  by mouth daily.   metFORMIN 500 MG tablet Commonly known as:  GLUCOPHAGE Take 1 tablet (500 mg total) by mouth 2 (two) times daily with a meal.   olmesartan 20 MG tablet Commonly known as:  BENICAR Take 1 tablet (20 mg total) by mouth daily.   rosuvastatin 40 MG tablet Commonly known as:  CRESTOR Take 1 tablet (40 mg total) by mouth daily.          Objective:    BP 132/88   Pulse 98   Temp 98.2 F (36.8 C) (Oral)   Ht 6' (1.829 m)   Wt (!) 305 lb 6.4 oz (138.5 kg)   BMI 41.42 kg/m   Allergies  Allergen Reactions  . Atorvastatin Other (See Comments)     Elevated liver enzymes  . Lisinopril Cough  . Cymbalta [Duloxetine Hcl] Rash    Wt Readings from Last 3 Encounters:  11/14/17 (!) 305 lb 6.4 oz (138.5 kg)  09/21/17 (!) 306 lb 3.2 oz (138.9 kg)  08/13/17 (!) 308 lb (139.7 kg)    Physical Exam  Constitutional: He appears well-developed and well-nourished. No distress.  HENT:  Head: Normocephalic and atraumatic.  Eyes: Pupils are equal, round, and reactive to light. Conjunctivae and EOM are normal.  Cardiovascular: Normal rate, regular rhythm and normal heart sounds.  Pulmonary/Chest: Effort normal and breath sounds normal. No respiratory distress.  Skin: Skin is warm and dry. Rash noted. Rash is macular. There is erythema.  Psychiatric: He has a normal mood and affect. His behavior is normal.  Nursing note and vitals reviewed.   Results for orders placed or performed in visit on 11/14/17  Bayer DCA Hb A1c Waived  Result Value Ref Range   HB A1C (BAYER DCA - WAIVED) 6.9 <7.0 %      Assessment & Plan:   1. Atopic dermatitis, unspecified type - betamethasone dipropionate (DIPROLENE) 0.05 % ointment; Apply topically 2 (two) times daily.  Dispense: 50 g; Refill: 5  2. Type 2 diabetes mellitus without complication, without long-term current use of insulin (HCC) - Bayer DCA Hb A1c Waived   Continue all other maintenance medications as listed above.  Follow up plan: Return in about 3 months (around 02/13/2018).  Educational handout given for survey  Remus Loffler PA-C [facility] [address]  [place], [place] [address] [phone]   11/16/2017, 2:02 PM

## 2017-11-20 NOTE — Telephone Encounter (Signed)
Appointment given for 12/6 with [facility]

## 2018-01-07 MED FILL — OLMESARTAN MEDOXOMIL 20 MG: 20 | 90 days supply | Qty: 90 | Fill #0

## 2018-01-07 MED FILL — AMLODIPINE BESYLATE 10 MG T: 10 | 90 days supply | Qty: 90 | Fill #0

## 2018-01-07 MED FILL — HYDROCHLOROTHIAZIDE 25 MG T: 25 | 90 days supply | Qty: 90 | Fill #0

## 2018-02-08 ENCOUNTER — Ambulatory Visit (INDEPENDENT_AMBULATORY_CARE_PROVIDER_SITE_OTHER): Payer: 59 | Admitting: Nurse Practitioner

## 2018-02-08 ENCOUNTER — Encounter: Payer: Self-pay | Admitting: Nurse Practitioner

## 2018-02-08 VITALS — BP 152/96 | HR 101 | Temp 97.4°F | Ht 72.0 in | Wt 302.0 lb

## 2018-02-08 DIAGNOSIS — J069 Acute upper respiratory infection, unspecified: Secondary | ICD-10-CM | POA: Diagnosis not present

## 2018-02-08 MED ORDER — AZITHROMYCIN 250 MG PO TABS: ORAL_TABLET | ORAL | 0 refills | Status: DC

## 2018-02-08 MED ORDER — BENZONATATE 100 MG PO CAPS: 100.0000 mg | ORAL_CAPSULE | Freq: Three times a day (TID) | ORAL | 0 refills | Status: DC | PRN

## 2018-02-08 NOTE — Patient Instructions (Signed)

## 2018-02-08 NOTE — Progress Notes (Signed)
   Subjective:    Patient ID: [name], male    DOB: [birth year], 42 y.o.   MRN: [id number]   Chief Complaint: Cough and Nasal Congestion   HPI Patient come sin today c/o nasal congestion and cough for greater then 10 days. He feels about the same as did when it started. Except cough is not as bad.    Review of Systems  Constitutional: Positive for chills and fever (?).  HENT: Positive for congestion, sinus pressure and sinus pain. Negative for ear pain, sore throat, trouble swallowing and voice change.   Respiratory: Positive for cough (productive at times).   Cardiovascular: Negative.   Gastrointestinal: Negative.   Genitourinary: Negative.   Neurological: Positive for headaches.  Psychiatric/Behavioral: Negative.   All other systems reviewed and are negative.      Objective:   Physical Exam  Constitutional: He is oriented to person, place, and time. He appears well-developed and well-nourished. No distress.  HENT:  Right Ear: Hearing, tympanic membrane, external ear and ear canal normal.  Left Ear: Hearing, tympanic membrane, external ear and ear canal normal.  Nose: Mucosal edema and rhinorrhea present. Right sinus exhibits maxillary sinus tenderness. Right sinus exhibits no frontal sinus tenderness. Left sinus exhibits maxillary sinus tenderness.  Mouth/Throat: Uvula is midline, oropharynx is clear and moist and mucous membranes are normal.  Eyes: Pupils are equal, round, and reactive to light.  Neck: Normal range of motion. Neck supple.  Cardiovascular: Normal rate and regular rhythm.  Pulmonary/Chest: Effort normal.  Course wet cough  Neurological: He is alert and oriented to person, place, and time.  Skin: Skin is warm.  Nursing note and vitals reviewed.   BP (!) 152/96   Pulse (!) 101   Temp (!) 97.4 F (36.3 C) (Oral)   Ht 6' (1.829 m)   Wt (!) 302 lb (137 kg)   BMI 40.96 kg/m       Assessment & Plan:  Frank ArenasCarlos R Tompson in today with chief complaint  of Cough and Nasal Congestion   1. Upper respiratory infection with cough and congestion  - azithromycin (ZITHROMAX Z-PAK) 250 MG tablet; As directed  Dispense: 6 tablet; Refill: 0 - benzonatate (TESSALON PERLES) 100 MG capsule; Take 1 capsule (100 mg total) by mouth 3 (three) times daily as needed for cough.  Dispense: 20 capsule; Refill: 0 1. Take meds as prescribed 2. Use a cool mist humidifier especially during the winter months and when heat has been humid. 3. Use saline nose sprays frequently 4. Saline irrigations of the nose can be very helpful if done frequently.  * 4X daily for 1 week*  * Use of a nettie pot can be helpful with this. Follow directions with this* 5. Drink plenty of fluids 6. Keep thermostat turn down low 7.For any cough or congestion  Use plain Mucinex- regular strength or max strength is fine   * Children- consult with Pharmacist for dosing 8. For fever or aces or pains- take tylenol or ibuprofen appropriate for age and weight.  * for fevers greater than 101 orally you may alternate ibuprofen and tylenol every  3 hours.   Mary-Margaret Daphine DeutscherMartin, FNP

## 2018-02-15 ENCOUNTER — Ambulatory Visit: Payer: Self-pay | Admitting: Physician Assistant

## 2018-02-26 ENCOUNTER — Encounter: Payer: Self-pay | Admitting: Physician Assistant

## 2018-04-04 ENCOUNTER — Encounter: Payer: Self-pay | Admitting: Family Medicine

## 2018-04-04 ENCOUNTER — Ambulatory Visit (INDEPENDENT_AMBULATORY_CARE_PROVIDER_SITE_OTHER): Payer: No Typology Code available for payment source | Admitting: Family Medicine

## 2018-04-04 VITALS — BP 138/98 | HR 87 | Temp 99.0°F | Ht 72.0 in | Wt 297.0 lb

## 2018-04-04 DIAGNOSIS — J0110 Acute frontal sinusitis, unspecified: Secondary | ICD-10-CM

## 2018-04-04 MED ORDER — AMOXICILLIN-POT CLAVULANATE 875-125 MG PO TABS: 1.0000 | ORAL_TABLET | Freq: Two times a day (BID) | ORAL | 0 refills | Status: AC

## 2018-04-04 MED ORDER — LORATADINE 10 MG PO TABS: 10.0000 mg | ORAL_TABLET | Freq: Every day | ORAL | Status: DC

## 2018-04-04 NOTE — Patient Instructions (Addendum)

## 2018-04-04 NOTE — Progress Notes (Signed)
    Subjective:     [name] is a 43 y.o. male who presents for evaluation of sinus pain. Symptoms include: congestion, cough, facial pain, post nasal drip, sinus pressure and sore throat. Onset of symptoms was 9 days ago. Symptoms have been gradually worsening since that time. Past history is significant for no history of pneumonia or bronchitis. Patient is a non-smoker.  The following portions of the patient's history were reviewed and updated as appropriate: allergies, current medications, past family history, past medical history, past social history, past surgical history and problem list.  Review of Systems Constitutional: positive for chills, fatigue and fevers Eyes: negative Ears, nose, mouth, throat, and face: positive for nasal congestion, sore throat and ear fullness, sinus pressure, rhinorrhea, and postnasal drip Respiratory: positive for cough and sputum Cardiovascular: negative Gastrointestinal: negative Musculoskeletal:positive for myalgias Neurological: positive for headaches   Objective:    BP (!) 138/98   Pulse 87   Temp 99 F (37.2 C) (Oral)   Ht 6' (1.829 m)   Wt 297 lb (134.7 kg)   BMI 40.28 kg/m  General appearance: alert, cooperative, mild distress and morbidly obese Head: Normocephalic, without obvious abnormality, atraumatic Eyes: negative Ears: abnormal TM right ear - serous middle ear fluid and abnormal TM left ear - serous middle ear fluid Nose: purulent and scant discharge, moderate congestion, turbinates red, swollen, no maxillary sinus tenderness bilateral, moderate frontal sinus tenderness bilateral Throat: abnormal findings: mild oropharyngeal erythema Neck: no adenopathy, no carotid bruit, no JVD, supple, symmetrical, trachea midline and thyroid not enlarged, symmetric, no tenderness/mass/nodules Lungs: clear to auscultation bilaterally Heart: regular rate and rhythm, S1, S2 normal, no murmur, click, rub or gallop Skin: Skin color,  texture, turgor normal. No rashes or lesions Neurologic: Grossly normal    Assessment:     [name] was seen today for sinusitis and wheezing.  Diagnoses and all orders for this visit:  Acute non-recurrent frontal sinusitis Increase fluid intake. Continue Flonase. Medications as prescribed. Report any new or worsening symptoms.  -     amoxicillin-clavulanate (AUGMENTIN) 875-125 MG tablet; Take 1 tablet by mouth 2 (two) times daily for 10 days. -     loratadine (CLARITIN) 10 MG tablet; Take 1 tablet (10 mg total) by mouth daily.     Plan:    Nasal saline sprays. Nasal steroids per medication orders. Antihistamines per medication orders. Augmentin per medication orders.    The above assessment and management plan was discussed with the patient. The patient verbalized understanding of and has agreed to the management plan. Patient is aware to call the clinic if symptoms fail to improve or worsen. Patient is aware when to return to the clinic for a follow-up visit. Patient educated on when it is appropriate to go to the emergency department.   Kari Baars, FNP-C [facility] [address] [place], [place] [address] [phone]

## 2018-04-22 ENCOUNTER — Encounter: Payer: Self-pay | Admitting: Physician Assistant

## 2018-04-22 ENCOUNTER — Ambulatory Visit (INDEPENDENT_AMBULATORY_CARE_PROVIDER_SITE_OTHER): Payer: No Typology Code available for payment source | Admitting: Physician Assistant

## 2018-04-22 VITALS — BP 125/82 | HR 90 | Temp 98.3°F | Ht 72.0 in | Wt 295.2 lb

## 2018-04-22 DIAGNOSIS — Z8709 Personal history of other diseases of the respiratory system: Secondary | ICD-10-CM

## 2018-04-22 DIAGNOSIS — J321 Chronic frontal sinusitis: Secondary | ICD-10-CM

## 2018-04-22 DIAGNOSIS — G4733 Obstructive sleep apnea (adult) (pediatric): Secondary | ICD-10-CM

## 2018-04-22 MED ORDER — AMOXICILLIN-POT CLAVULANATE 875-125 MG PO TABS: 1.0000 | ORAL_TABLET | Freq: Two times a day (BID) | ORAL | 1 refills | Status: DC

## 2018-04-22 MED ORDER — PREDNISONE 10 MG (21) PO TBPK: ORAL_TABLET | ORAL | 0 refills | Status: DC

## 2018-04-23 NOTE — Progress Notes (Signed)
BP 125/82   Pulse 90   Temp 98.3 F (36.8 C) (Oral)   Ht 6' (1.829 m)   Wt 295 lb 3.2 oz (133.9 kg)   BMI 40.04 kg/m    Subjective:    Patient ID: [name], male    DOB: [birth year], 43 y.o.   MRN: [id number]  HPI: [name] is a 43 y.o. male presenting on 04/22/2018 for URI (head and chest congestion, bilateral ear pressure, x 2 weeks)  This patient comes in with upper respiratory symptoms for greater than 2 weeks.  He states he has had significant head and chest congestion.  Over-the-counter medications have not helped very much.  He feels the most pressure in his head now.  And it is over his frontal and maxillary sinuses.  He will have green congestion when he blows it out.  However he does not have a copious amount of drainage.  He does have a cough but it is dry at this time.  He denies any fever or chills at this time.  He has been seen by ENT in the past and would like to have a referral back there.  In addition he would like to go for sleep study to have this evaluated.  He is working very hard on losing weight and knows that if he can have his sleep treated it would help tremendously with his sleep weight loss efforts.  Past Medical History:  Diagnosis Date  . Anxiety   . Depression   . Diabetes mellitus without complication (HCC)   . Hyperlipidemia   . Hypertension   . Hypothyroidism   . Obesity   . OSA (obstructive sleep apnea)   . Sinus congestion    Relevant past medical, surgical, family and social history reviewed and updated as indicated. Interim medical history since our last visit reviewed. Allergies and medications reviewed and updated. DATA REVIEWED: CHART IN EPIC  Family History reviewed for pertinent findings.  Review of Systems  Constitutional: Positive for fatigue. Negative for appetite change, chills and fever.  HENT: Positive for congestion, sinus pressure, sinus pain and sore throat.   Eyes: Negative.  Negative for pain and visual  disturbance.  Respiratory: Positive for cough. Negative for chest tightness, shortness of breath and wheezing.   Cardiovascular: Negative.  Negative for chest pain, palpitations and leg swelling.  Gastrointestinal: Negative.  Negative for abdominal pain, diarrhea, nausea and vomiting.  Endocrine: Negative.   Genitourinary: Negative.   Musculoskeletal: Negative for back pain and myalgias.  Skin: Negative.  Negative for color change and rash.  Neurological: Positive for headaches. Negative for weakness and numbness.  Psychiatric/Behavioral: Negative.     Allergies as of 04/22/2018      Reactions   Atorvastatin Other (See Comments)   Elevated liver enzymes   Lisinopril Cough   Cymbalta [duloxetine Hcl] Rash      Medication List       Accurate as of April 22, 2018 11:59 PM. Always use your most recent med list.        amLODipine 10 MG tablet Commonly known as:  NORVASC Take 1 tablet (10 mg total) by mouth daily.   amoxicillin-clavulanate 875-125 MG tablet Commonly known as:  AUGMENTIN Take 1 tablet by mouth 2 (two) times daily.   aspirin 81 MG chewable tablet Chew 81 mg by mouth daily.   fluticasone 50 MCG/ACT nasal spray Commonly known as:  FLONASE Place 1 spray into both nostrils 2 (two) times daily.  hydrochlorothiazide 25 MG tablet Commonly known as:  HYDRODIURIL Take 1 tablet (25 mg total) by mouth daily.   levothyroxine 88 MCG tablet Commonly known as:  SYNTHROID, LEVOTHROID Take 1 tablet (88 mcg total) by mouth daily.   loratadine 10 MG tablet Commonly known as:  CLARITIN Take 1 tablet (10 mg total) by mouth daily.   metFORMIN 500 MG tablet Commonly known as:  GLUCOPHAGE Take 1 tablet (500 mg total) by mouth 2 (two) times daily with a meal.   olmesartan 20 MG tablet Commonly known as:  BENICAR Take 1 tablet (20 mg total) by mouth daily.   predniSONE 10 MG (21) Tbpk tablet Commonly known as:  STERAPRED UNI-PAK 21 TAB As directed x 6 days     rosuvastatin 40 MG tablet Commonly known as:  CRESTOR Take 1 tablet (40 mg total) by mouth daily.          Objective:    BP 125/82   Pulse 90   Temp 98.3 F (36.8 C) (Oral)   Ht 6' (1.829 m)   Wt 295 lb 3.2 oz (133.9 kg)   BMI 40.04 kg/m   Allergies  Allergen Reactions  . Atorvastatin Other (See Comments)    Elevated liver enzymes  . Lisinopril Cough  . Cymbalta [Duloxetine Hcl] Rash    Wt Readings from Last 3 Encounters:  04/22/18 295 lb 3.2 oz (133.9 kg)  04/04/18 297 lb (134.7 kg)  02/08/18 (!) 302 lb (137 kg)    Physical Exam Constitutional:      Appearance: He is well-developed.  HENT:     Head: Normocephalic and atraumatic.     Right Ear: Tympanic membrane and external ear normal. No middle ear effusion.     Left Ear: Tympanic membrane and external ear normal.  No middle ear effusion.     Nose: Mucosal edema and rhinorrhea present.     Right Sinus: Maxillary sinus tenderness and frontal sinus tenderness present.     Left Sinus: Maxillary sinus tenderness and frontal sinus tenderness present.     Mouth/Throat:     Pharynx: Uvula midline. Posterior oropharyngeal erythema present.  Eyes:     General:        Right eye: No discharge.        Left eye: No discharge.     Conjunctiva/sclera: Conjunctivae normal.     Pupils: Pupils are equal, round, and reactive to light.  Neck:     Musculoskeletal: Normal range of motion.  Cardiovascular:     Rate and Rhythm: Normal rate and regular rhythm.     Heart sounds: Normal heart sounds.  Pulmonary:     Effort: Pulmonary effort is normal. No respiratory distress.     Breath sounds: Normal breath sounds. No wheezing.  Abdominal:     Palpations: Abdomen is soft.  Lymphadenopathy:     Cervical: No cervical adenopathy.  Skin:    General: Skin is warm and dry.  Neurological:     Mental Status: He is alert and oriented to person, place, and time.     Results for orders placed or performed in visit on 11/14/17   Bayer DCA Hb A1c Waived  Result Value Ref Range   HB A1C (BAYER DCA - WAIVED) 6.9 <7.0 %      Assessment & Plan:   1. Chronic frontal sinusitis - Ambulatory referral to ENT  2. OSA (obstructive sleep apnea) - Ambulatory referral to Neurology  3. History of deviated nasal septum - Ambulatory referral to  ENT   Continue all other maintenance medications as listed above.  Follow up plan: No follow-ups on file.  Educational handout given for survey  Remus Loffler PA-C [facility] [address]  [place], [place] [address] [phone]   04/23/2018, 5:04 PM

## 2018-05-06 ENCOUNTER — Other Ambulatory Visit: Payer: Self-pay | Admitting: Physician Assistant

## 2018-05-06 DIAGNOSIS — I10 Essential (primary) hypertension: Secondary | ICD-10-CM

## 2018-05-06 MED FILL — AMLODIPINE BESYLATE 10 MG T: 10 | 90 days supply | Qty: 90 | Fill #1

## 2018-05-06 MED FILL — HYDROCHLOROTHIAZIDE 25 MG T: 25 | 90 days supply | Qty: 90 | Fill #1

## 2018-05-06 MED FILL — OLMESARTAN MEDOXOMIL 20 MG: 20 | 90 days supply | Qty: 90 | Fill #1

## 2018-05-07 MED ORDER — OLMESARTAN MEDOXOMIL 20 MG PO TABS: 20.0000 mg | ORAL_TABLET | Freq: Every day | ORAL | 0 refills | Status: DC

## 2018-05-07 MED ORDER — HYDROCHLOROTHIAZIDE 25 MG PO TABS: 25.0000 mg | ORAL_TABLET | Freq: Every day | ORAL | 0 refills | Status: DC

## 2018-05-07 MED ORDER — AMLODIPINE BESYLATE 10 MG PO TABS: 10.0000 mg | ORAL_TABLET | Freq: Every day | ORAL | 0 refills | Status: DC

## 2018-05-07 MED ORDER — METFORMIN HCL 500 MG PO TABS: 500.0000 mg | ORAL_TABLET | Freq: Two times a day (BID) | ORAL | 0 refills | Status: DC

## 2018-05-07 NOTE — Telephone Encounter (Signed)
A 30 day supply sent to pharmacy, pt to call back to schedule appt

## 2018-05-16 MED FILL — metFORMIN HCL 500 MG TABS: 500 | 30 days supply | Qty: 60 | Fill #0

## 2018-05-29 ENCOUNTER — Other Ambulatory Visit: Payer: Self-pay | Admitting: Physician Assistant

## 2018-05-29 MED ORDER — LEVOTHYROXINE SODIUM 88 MCG PO TABS: 88.0000 ug | ORAL_TABLET | Freq: Every day | ORAL | 0 refills | Status: DC

## 2018-05-29 NOTE — Telephone Encounter (Signed)
Refill sent to pharmacy.   

## 2018-06-05 MED FILL — LEVOTHYROXINE 88 MCG TABLET: 88 | 30 days supply | Qty: 30 | Fill #0

## 2018-07-12 ENCOUNTER — Telehealth: Payer: Self-pay | Admitting: Physician Assistant

## 2018-07-22 ENCOUNTER — Encounter: Payer: Self-pay | Admitting: Physician Assistant

## 2018-07-22 ENCOUNTER — Ambulatory Visit (INDEPENDENT_AMBULATORY_CARE_PROVIDER_SITE_OTHER): Payer: No Typology Code available for payment source

## 2018-07-22 ENCOUNTER — Ambulatory Visit: Payer: No Typology Code available for payment source | Admitting: Physician Assistant

## 2018-07-22 ENCOUNTER — Ambulatory Visit: Payer: No Typology Code available for payment source

## 2018-07-22 ENCOUNTER — Ambulatory Visit (INDEPENDENT_AMBULATORY_CARE_PROVIDER_SITE_OTHER): Payer: No Typology Code available for payment source | Admitting: Physician Assistant

## 2018-07-22 ENCOUNTER — Other Ambulatory Visit: Payer: Self-pay

## 2018-07-22 DIAGNOSIS — E119 Type 2 diabetes mellitus without complications: Secondary | ICD-10-CM

## 2018-07-22 DIAGNOSIS — I10 Essential (primary) hypertension: Secondary | ICD-10-CM

## 2018-07-22 DIAGNOSIS — M545 Low back pain, unspecified: Secondary | ICD-10-CM

## 2018-07-22 DIAGNOSIS — E039 Hypothyroidism, unspecified: Secondary | ICD-10-CM | POA: Diagnosis not present

## 2018-07-22 LAB — BAYER DCA HB A1C WAIVED: HB A1C (BAYER DCA - WAIVED): 6.5 % (ref <7.0)

## 2018-07-22 MED ORDER — METHOCARBAMOL 500 MG PO TABS: 500.0000 mg | ORAL_TABLET | Freq: Three times a day (TID) | ORAL | 2 refills | Status: DC | PRN

## 2018-07-22 MED ORDER — METFORMIN HCL 500 MG PO TABS: 500.0000 mg | ORAL_TABLET | Freq: Two times a day (BID) | ORAL | 1 refills | Status: DC

## 2018-07-22 MED ORDER — OLMESARTAN MEDOXOMIL 20 MG PO TABS: 20.0000 mg | ORAL_TABLET | Freq: Every day | ORAL | 1 refills | Status: DC

## 2018-07-22 MED ORDER — IBUPROFEN 800 MG PO TABS: 800.0000 mg | ORAL_TABLET | Freq: Three times a day (TID) | ORAL | 1 refills | Status: DC | PRN

## 2018-07-22 MED ORDER — ROSUVASTATIN CALCIUM 40 MG PO TABS: 40.0000 mg | ORAL_TABLET | Freq: Every day | ORAL | 3 refills | Status: DC

## 2018-07-22 MED ORDER — HYDROCHLOROTHIAZIDE 25 MG PO TABS: 25.0000 mg | ORAL_TABLET | Freq: Every day | ORAL | 1 refills | Status: DC

## 2018-07-22 MED ORDER — AMLODIPINE BESYLATE 10 MG PO TABS: 10.0000 mg | ORAL_TABLET | Freq: Every day | ORAL | 3 refills | Status: DC

## 2018-07-22 MED FILL — IBUPROFEN 800 MG TAB: 800 | 20 days supply | Qty: 60 | Fill #0

## 2018-07-22 MED FILL — METHOCARBAMOL 500 MG TABLET: 500 | 20 days supply | Qty: 60 | Fill #0

## 2018-07-22 NOTE — Progress Notes (Signed)
Telephone visit  Subjective: CC: Hypertension, diabetes, hypothyroidism, lumbar pain PCP: Terald Sleeper, PA-C YYQ:MGNOIB [name] is a 43 y.o. male calls for telephone consult today. Patient provides verbal consent for consult held via phone.  Patient is identified with 2 separate identifiers.  At this time the entire area is on COVID-19 social distancing and stay home orders are in place.  Patient is of higher risk and therefore we are performing this by a virtual method.  Location of patient: Home Location of provider: HOME Others present for call: No  This patient is having a follow-up for his chronic medical conditions.  He does have hypertension, diabetes, hypothyroidism.  He does need labs for all of these.  He states his weight has stayed stable.  He has had an episode where he has had a little bit of chest discomfort and palpitations.  He has not had thyroid checked in a while we will have that performed today.  He also is having some lumbar pain episodes.  Is been going on for 2 or 3 weeks now.  He is tried a little bit of ibuprofen but not on a regular basis.  Some days he does have more physicality with his job.  And he does remember having a lot of lifting few weeks ago.   ROS: Per HPI  Allergies  Allergen Reactions  . Atorvastatin Other (See Comments)    Elevated liver enzymes  . Lisinopril Cough  . Cymbalta [Duloxetine Hcl] Rash   Past Medical History:  Diagnosis Date  . Anxiety   . Depression   . Diabetes mellitus without complication ([place])   . Hyperlipidemia   . Hypertension   . Hypothyroidism   . Obesity   . OSA (obstructive sleep apnea)   . Sinus congestion     Current Outpatient Medications:  .  amLODipine (NORVASC) 10 MG tablet, Take 1 tablet (10 mg total) by mouth daily., Disp: 90 tablet, Rfl: 3 .  aspirin 81 MG chewable tablet, Chew 81 mg by mouth daily., Disp: , Rfl:  .  fluticasone (FLONASE) 50 MCG/ACT nasal spray, Place 1 spray into both  nostrils 2 (two) times daily., Disp: 16 g, Rfl: 6 .  hydrochlorothiazide (HYDRODIURIL) 25 MG tablet, Take 1 tablet (25 mg total) by mouth daily., Disp: 90 tablet, Rfl: 1 .  ibuprofen (ADVIL) 800 MG tablet, Take 1 tablet (800 mg total) by mouth every 8 (eight) hours as needed for moderate pain., Disp: 60 tablet, Rfl: 1 .  levothyroxine (SYNTHROID, LEVOTHROID) 88 MCG tablet, Take 1 tablet (88 mcg total) by mouth daily., Disp: 30 tablet, Rfl: 0 .  loratadine (CLARITIN) 10 MG tablet, Take 1 tablet (10 mg total) by mouth daily., Disp: , Rfl:  .  metFORMIN (GLUCOPHAGE) 500 MG tablet, Take 1 tablet (500 mg total) by mouth 2 (two) times daily with a meal., Disp: 180 tablet, Rfl: 1 .  methocarbamol (ROBAXIN) 500 MG tablet, Take 1 tablet (500 mg total) by mouth every 8 (eight) hours as needed for muscle spasms., Disp: 60 tablet, Rfl: 2 .  olmesartan (BENICAR) 20 MG tablet, Take 1 tablet (20 mg total) by mouth daily., Disp: 90 tablet, Rfl: 1 .  rosuvastatin (CRESTOR) 40 MG tablet, Take 1 tablet (40 mg total) by mouth daily., Disp: 90 tablet, Rfl: 3  Assessment/ Plan: 43 y.o. male   1. HTN (hypertension), benign - amLODipine (NORVASC) 10 MG tablet; Take 1 tablet (10 mg total) by mouth daily.  Dispense: 90 tablet; Refill:  3 - olmesartan (BENICAR) 20 MG tablet; Take 1 tablet (20 mg total) by mouth daily.  Dispense: 90 tablet; Refill: 1 - CBC with Differential/Platelet; Future - CMP14+EGFR; Future - Lipid panel; Future - Microalbumin / creatinine urine ratio; Future - Microalbumin / creatinine urine ratio - Lipid panel - CMP14+EGFR - CBC with Differential/Platelet  2. Type 2 diabetes mellitus without complication, without long-term current use of insulin (HCC) - CBC with Differential/Platelet; Future - CMP14+EGFR; Future - Lipid panel; Future - Bayer DCA Hb A1c Waived; Future - Microalbumin / creatinine urine ratio; Future - Microalbumin / creatinine urine ratio - Bayer DCA Hb A1c Waived - Lipid  panel - CMP14+EGFR - CBC with Differential/Platelet  3. Hypothyroidism, unspecified type - TSH; Future - TSH  4. Lumbar pain - DG Lumbar Spine 2-3 Views; Future Ibuprofen and Robaxin  Start time: 8:31 AM End time: 8:48 AM  Meds ordered this encounter  Medications  . amLODipine (NORVASC) 10 MG tablet    Sig: Take 1 tablet (10 mg total) by mouth daily.    Dispense:  90 tablet    Refill:  3    Order Specific Question:   Supervising Provider    Answer:   Janora Norlander [[id number]]  . hydrochlorothiazide (HYDRODIURIL) 25 MG tablet    Sig: Take 1 tablet (25 mg total) by mouth daily.    Dispense:  90 tablet    Refill:  1    Order Specific Question:   Supervising Provider    Answer:   Janora Norlander [[id number]]  . metFORMIN (GLUCOPHAGE) 500 MG tablet    Sig: Take 1 tablet (500 mg total) by mouth 2 (two) times daily with a meal.    Dispense:  180 tablet    Refill:  1    Order Specific Question:   Supervising Provider    Answer:   Janora Norlander [[id number]]  . olmesartan (BENICAR) 20 MG tablet    Sig: Take 1 tablet (20 mg total) by mouth daily.    Dispense:  90 tablet    Refill:  1    Order Specific Question:   Supervising Provider    Answer:   Janora Norlander [[id number]]  . rosuvastatin (CRESTOR) 40 MG tablet    Sig: Take 1 tablet (40 mg total) by mouth daily.    Dispense:  90 tablet    Refill:  3    Order Specific Question:   Supervising Provider    Answer:   Janora Norlander [[id number]]  . ibuprofen (ADVIL) 800 MG tablet    Sig: Take 1 tablet (800 mg total) by mouth every 8 (eight) hours as needed for moderate pain.    Dispense:  60 tablet    Refill:  1    Order Specific Question:   Supervising Provider    Answer:   Janora Norlander [[id number]]  . methocarbamol (ROBAXIN) 500 MG tablet    Sig: Take 1 tablet (500 mg total) by mouth every 8 (eight) hours as needed for muscle spasms.    Dispense:  60 tablet    Refill:  2    Order Specific Question:    Supervising Provider    Answer:   Janora Norlander [[id number]]    Particia Nearing PA-C [place] [phone]

## 2018-07-23 ENCOUNTER — Telehealth: Payer: Self-pay | Admitting: Physician Assistant

## 2018-07-23 ENCOUNTER — Ambulatory Visit: Payer: No Typology Code available for payment source | Admitting: Physician Assistant

## 2018-07-23 LAB — CMP14+EGFR
ALT: 22 IU/L (ref 0–44)
AST: 15 IU/L (ref 0–40)
Albumin/Globulin Ratio: 1.5 (ref 1.2–2.2)
Albumin: 4.2 g/dL (ref 4.0–5.0)
Alkaline Phosphatase: 55 IU/L (ref 39–117)
BUN/Creatinine Ratio: 12 (ref 9–20)
BUN: 14 mg/dL (ref 6–24)
Bilirubin Total: 0.2 mg/dL (ref 0.0–1.2)
CO2: 24 mmol/L (ref 20–29)
Calcium: 9.2 mg/dL (ref 8.7–10.2)
Chloride: 101 mmol/L (ref 96–106)
Creatinine, Ser: 1.15 mg/dL (ref 0.76–1.27)
GFR calc Af Amer: 92 mL/min/{1.73_m2} (ref >59)
GFR calc non Af Amer: 79 mL/min/{1.73_m2} (ref >59)
Globulin, Total: 2.8 g/dL (ref 1.5–4.5)
Glucose: 95 mg/dL (ref 65–99)
Potassium: 4.5 mmol/L (ref 3.5–5.2)
Sodium: 139 mmol/L (ref 134–144)
Total Protein: 7 g/dL (ref 6.0–8.5)

## 2018-07-23 LAB — CBC WITH DIFFERENTIAL/PLATELET
Basophils Absolute: 0.1 10*3/uL (ref 0.0–0.2)
Basos: 1 %
EOS (ABSOLUTE): 0.2 10*3/uL (ref 0.0–0.4)
Eos: 3 %
Hematocrit: 46 % (ref 37.5–51.0)
Hemoglobin: 15.1 g/dL (ref 13.0–17.7)
Immature Grans (Abs): 0 10*3/uL (ref 0.0–0.1)
Immature Granulocytes: 0 %
Lymphocytes Absolute: 2.5 10*3/uL (ref 0.7–3.1)
Lymphs: 44 %
MCH: 26.4 pg — ABNORMAL LOW (ref 26.6–33.0)
MCHC: 32.8 g/dL (ref 31.5–35.7)
MCV: 81 fL (ref 79–97)
Monocytes Absolute: 0.5 10*3/uL (ref 0.1–0.9)
Monocytes: 9 %
Neutrophils Absolute: 2.4 10*3/uL (ref 1.4–7.0)
Neutrophils: 43 %
Platelets: 319 10*3/uL (ref 150–450)
RBC: 5.71 x10E6/uL (ref 4.14–5.80)
RDW: 15 % (ref 11.6–15.4)
WBC: 5.6 10*3/uL (ref 3.4–10.8)

## 2018-07-23 LAB — LIPID PANEL
Chol/HDL Ratio: 5.5 ratio — ABNORMAL HIGH (ref 0.0–5.0)
Cholesterol, Total: 230 mg/dL — ABNORMAL HIGH (ref 100–199)
HDL: 42 mg/dL (ref >39)
LDL Calculated: 172 mg/dL — ABNORMAL HIGH (ref 0–99)
Triglycerides: 82 mg/dL (ref 0–149)
VLDL Cholesterol Cal: 16 mg/dL (ref 5–40)

## 2018-07-23 LAB — MICROALBUMIN / CREATININE URINE RATIO
Creatinine, Urine: 157.7 mg/dL
Microalb/Creat Ratio: 2 mg/g creat (ref 0–29)
Microalbumin, Urine: 3.9 ug/mL

## 2018-07-23 LAB — TSH: TSH: 1.76 u[IU]/mL (ref 0.450–4.500)

## 2018-07-23 NOTE — Telephone Encounter (Signed)
Pt is wanting to talk to AJ if at all possible about some issues he didn't get to discuss with her yesterday. Pt would really like to come in and see AJ offered next avail. 5/29 pt states would rather me send message to AJ and her nurse to see if they could just call him.

## 2018-07-24 ENCOUNTER — Other Ambulatory Visit: Payer: Self-pay | Admitting: Physician Assistant

## 2018-07-24 DIAGNOSIS — K2100 Gastro-esophageal reflux disease with esophagitis, without bleeding: Secondary | ICD-10-CM | POA: Insufficient documentation

## 2018-07-24 MED ORDER — PANTOPRAZOLE SODIUM 40 MG PO TBEC: 40.0000 mg | DELAYED_RELEASE_TABLET | Freq: Every day | ORAL | 3 refills | Status: DC

## 2018-07-27 ENCOUNTER — Other Ambulatory Visit: Payer: Self-pay

## 2018-07-27 ENCOUNTER — Ambulatory Visit (HOSPITAL_COMMUNITY)
Admission: EM | Admit: 2018-07-27 | Discharge: 2018-07-27 | Disposition: A | Discharge status: Home / Self Care | Payer: No Typology Code available for payment source | Attending: Emergency Medicine | Admitting: Emergency Medicine

## 2018-07-27 ENCOUNTER — Ambulatory Visit (INDEPENDENT_AMBULATORY_CARE_PROVIDER_SITE_OTHER): Payer: No Typology Code available for payment source

## 2018-07-27 DIAGNOSIS — K219 Gastro-esophageal reflux disease without esophagitis: Secondary | ICD-10-CM

## 2018-07-27 DIAGNOSIS — J310 Chronic rhinitis: Secondary | ICD-10-CM

## 2018-07-27 DIAGNOSIS — R0602 Shortness of breath: Secondary | ICD-10-CM

## 2018-07-27 MED ORDER — ALBUTEROL SULFATE HFA 108 (90 BASE) MCG/ACT IN AERS: 1.0000 | INHALATION_SPRAY | Freq: Four times a day (QID) | RESPIRATORY_TRACT | 0 refills | Status: DC | PRN

## 2018-07-27 MED ORDER — MONTELUKAST SODIUM 10 MG PO TABS: 10.0000 mg | ORAL_TABLET | Freq: Every day | ORAL | 0 refills | Status: DC

## 2018-07-27 NOTE — ED Provider Notes (Signed)
[facility]    CSN: [id number] Arrival date & time: 07/27/18  1407     History   Chief Complaint Chief Complaint  Patient presents with  . Nasal Congestion  . Shortness of Breath    HPI Frank ArenasCarlos R Tubbs is a 43 y.o. male.   Frank ArenasCarlos R Farley presents with complaints of shortness of breath occasionally for the past few weeks. Worse if lay down. Comes and goes. E-visit with PCP a fe w days ago. Was told possibly reflux, new medications started. He has been taking, but symptoms haven't improved. Has a follow up appointment the end of this month. Some cough, which is dry. Nasal congestion, causing him to mouth breathe. This causes a little anxiety. No nasal drainage. Hx of URI's and allergies. Asthma has been discussed in the past with patient. No smoking history. No sore throat no ear pain. Can be worse after eating. No shortness of breath now. Denies any previous similar. No chest pain associated with this . Feels chest tightness occasionally, but is primarily after engaging chest muscles while at work. 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 No arm or jaw pain. No nausea or vomiting. No diaphoresis episodes. Has seen ENT in the past, follow up has been delayed due to covid. At initial consult, possible issue with nasal septum related to previous nose fracture. Encouraged to stay on flonase, which he has done. No rash. No fevers. Has been getting screened at work. No known ill contacts. No leg pain or edema. Hx of dm, htn, OSA, obesity, chronic sinusitis, anxiety, gerd.    ROS per HPI, negative if not otherwise mentioned.      Past Medical History:  Diagnosis Date  . Anxiety   . Depression   . Diabetes mellitus without complication (HCC)   . Hyperlipidemia   . Hypertension   . Hypothyroidism   . Obesity   . OSA (obstructive sleep apnea)   . Sinus congestion     Patient Active Problem List   Diagnosis Date Noted  . GERD with esophagitis 07/24/2018  . History of deviated  nasal septum 09/24/2017  . Controlled type 2 diabetes mellitus with diabetic nephropathy, without long-term current use of insulin (HCC) 06/11/2017  . Essential hypertension 01/05/2016  . Vitamin D deficiency 09/15/2014  . Vitamin B12 deficiency (non anemic) 09/15/2014  . Other allergic rhinitis 01/23/2014  . OSA (obstructive sleep apnea) 12/19/2013  . Bruxism 12/19/2013  . Onychomycosis 10/21/2013  . Type 2 diabetes mellitus without complication, without long-term current use of insulin (HCC) 07/08/2013  . HLD (hyperlipidemia) 09/24/2012  . Hypothyroidism 09/24/2012  . Morbid obesity (HCC) 09/24/2012  . Anxiety and depression 09/24/2012  . Lumbar pain 09/24/2012    Past Surgical History:  Procedure Laterality Date  . FRACTURE SURGERY     right arch  . HERNIA REPAIR  1990   Right ingruial & umbilical [place]   . LEFT HEART CATHETERIZATION WITH CORONARY ANGIOGRAM N/A 09/26/2011   Procedure: LEFT HEART CATHETERIZATION WITH CORONARY ANGIOGRAM;  Surgeon: Pamella PertJagadeesh R Ganji, MD;  [facility];  Service: Cardiovascular;  Laterality: N/A;  . Repair Right Arm Fracture  1996   [place]   . UVULOPALATOPHARYNGOPLASTY (UPPP)/TONSILLECTOMY/SEPTOPLASTY         Home Medications    Prior to Admission medications   Medication Sig Start Date End Date Taking? Authorizing Provider  albuterol (PROAIR HFA) 108 (90 Base) MCG/ACT inhaler Inhale 1-2 puffs into the lungs every 6 (six) hours as needed for wheezing or  shortness of breath. 07/27/18   Georgetta Haber, NP  amLODipine (NORVASC) 10 MG tablet Take 1 tablet (10 mg total) by mouth daily. 07/22/18   Remus Loffler, PA-C  aspirin 81 MG chewable tablet Chew 81 mg by mouth daily.    [provider]  fluticasone (FLONASE) 50 MCG/ACT nasal spray Place 1 spray into both nostrils 2 (two) times daily. 08/15/16   Remus Loffler, PA-C  hydrochlorothiazide (HYDRODIURIL) 25 MG tablet Take 1 tablet (25 mg total) by mouth daily. 07/22/18    Remus Loffler, PA-C  ibuprofen (ADVIL) 800 MG tablet Take 1 tablet (800 mg total) by mouth every 8 (eight) hours as needed for moderate pain. 07/22/18   Remus Loffler, PA-C  levothyroxine (SYNTHROID, LEVOTHROID) 88 MCG tablet Take 1 tablet (88 mcg total) by mouth daily. 05/29/18   Remus Loffler, PA-C  loratadine (CLARITIN) 10 MG tablet Take 1 tablet (10 mg total) by mouth daily. 04/04/18   Sonny Masters, FNP  metFORMIN (GLUCOPHAGE) 500 MG tablet Take 1 tablet (500 mg total) by mouth 2 (two) times daily with a meal. 07/22/18   Remus Loffler, PA-C  methocarbamol (ROBAXIN) 500 MG tablet Take 1 tablet (500 mg total) by mouth every 8 (eight) hours as needed for muscle spasms. 07/22/18   Remus Loffler, PA-C  montelukast (SINGULAIR) 10 MG tablet Take 1 tablet (10 mg total) by mouth at bedtime. 07/27/18   Georgetta Haber, NP  olmesartan (BENICAR) 20 MG tablet Take 1 tablet (20 mg total) by mouth daily. 07/22/18   Remus Loffler, PA-C  pantoprazole (PROTONIX) 40 MG tablet Take 1 tablet (40 mg total) by mouth daily. 07/24/18   Remus Loffler, PA-C  rosuvastatin (CRESTOR) 40 MG tablet Take 1 tablet (40 mg total) by mouth daily. 07/22/18   Remus Loffler, PA-C    Family History Family History  Problem Relation Age of Onset  . Hypertension Mother   . Hypertension Father   . Heart attack Father   . Breast cancer Paternal Grandmother   . Diabetes Paternal Grandmother   . Kidney disease Maternal Grandfather   . Down syndrome Brother     Social History Social History   Tobacco Use  . Smoking status: Never Smoker  . Smokeless tobacco: Never Used  Substance Use Topics  . Alcohol use: No  . Drug use: No     Allergies   Atorvastatin; Lisinopril; and Cymbalta [duloxetine hcl]   Review of Systems Review of Systems   Physical Exam Triage Vital Signs ED Triage Vitals  Enc Vitals Group     BP 07/27/18 1419 (!) 155/106     Pulse Rate 07/27/18 1419 89     Resp --      Temp 07/27/18 1419 98.2 F  (36.8 C)     Temp Source 07/27/18 1419 Oral     SpO2 07/27/18 1419 100 %     Weight --      Height --      Head Circumference --      Peak Flow --      Pain Score 07/27/18 1421 0     Pain Loc --      Pain Edu? --      Excl. in GC? --    No data found.  Updated Vital Signs BP (!) 155/106 (BP Location: Right Arm)   Pulse 89   Temp 98.2 F (36.8 C) (Oral)   SpO2 100%  Physical Exam Vitals signs reviewed.  Constitutional:      Appearance: He is well-developed.  HENT:     Head: Normocephalic and atraumatic.     Right Ear: Tympanic membrane, ear canal and external ear normal.     Left Ear: Tympanic membrane, ear canal and external ear normal.     Nose: Mucosal edema and rhinorrhea present.     Right Sinus: No maxillary sinus tenderness or frontal sinus tenderness.     Left Sinus: No maxillary sinus tenderness or frontal sinus tenderness.     Mouth/Throat:     Pharynx: Uvula midline.  Eyes:     Conjunctiva/sclera: Conjunctivae normal.     Pupils: Pupils are equal, round, and reactive to light.  Neck:     Musculoskeletal: Normal range of motion.  Cardiovascular:     Rate and Rhythm: Normal rate and regular rhythm.  Pulmonary:     Effort: Pulmonary effort is normal. No tachypnea or respiratory distress.     Breath sounds: Normal breath sounds. No wheezing or rhonchi.  Lymphadenopathy:     Cervical: No cervical adenopathy.  Skin:    General: Skin is warm and dry.  Neurological:     Mental Status: He is alert and oriented to person, place, and time.      UC Treatments / Results  Labs (all labs ordered are listed, but only abnormal results are displayed) Labs Reviewed - No data to display  EKG None  Radiology Dg Chest 2 View  Result Date: 07/27/2018 CLINICAL DATA:  Nasal congestion.  Shortness of breath.  Cough. EXAM: CHEST - 2 VIEW COMPARISON:  August 14, 2010 FINDINGS: The heart size and mediastinal contours are within normal limits. Both lungs are clear. The  visualized skeletal structures are unremarkable. IMPRESSION: No active cardiopulmonary disease. Electronically Signed   By: Gerome Sam III M.D   On: 07/27/2018 15:05    Procedures Procedures (including critical care time)  Medications Ordered in UC Medications - No data to display  Initial Impression / Assessment and Plan / UC Course  I have reviewed the triage vital signs and the nursing notes.  Pertinent labs & imaging results that were available during my care of the patient were reviewed by me and considered in my medical decision making (see chart for details).     Non toxic. No current chest pain  Or shortness of breath . No hypoxia, tachycardia or tachypnea. Chest xray reassuring. Reflux vs anxiety vs allergies vs asthma discussed and considered. Continue with protonix. Added inhaler as needed and nightly singulair. Return precautions provided. Patient verbalized understanding and agreeable to plan.  Ambulatory out of clinic without difficulty.   Final Clinical Impressions(s) / UC Diagnoses   Final diagnoses:  Chronic rhinitis  Gastroesophageal reflux disease, esophagitis presence not specified  SOB (shortness of breath)     Discharge Instructions     Your chest xray is normal today without acute findings.  Continue the medication prescribed for reflux, it can take consistent use for it to be effective.  I have sent for an inhaler which may be used as needed as well, for wheezing or shortness of breath .  Nightly singulair may also be helpful with your allergy symptoms, which may help your breathing.  Continue to follow with your primary care provider for management as needed.  Any worsening of symptoms, shortness of breath , chest pain , sweating, weakness, or otherwise worsening please go to the ER.     ED Prescriptions  Medication Sig Dispense Auth. Provider   montelukast (SINGULAIR) 10 MG tablet Take 1 tablet (10 mg total) by mouth at bedtime. 30 tablet  Linus Mako B, NP   albuterol (PROAIR HFA) 108 (90 Base) MCG/ACT inhaler Inhale 1-2 puffs into the lungs every 6 (six) hours as needed for wheezing or shortness of breath. 1 Inhaler Georgetta Haber, NP     Controlled Substance Prescriptions [place] Controlled Substance Registry consulted? Not Applicable   Georgetta Haber, NP 07/27/18 1540

## 2018-07-27 NOTE — Discharge Instructions (Signed)
Your chest xray is normal today without acute findings.  Continue the medication prescribed for reflux, it can take consistent use for it to be effective.  I have sent for an inhaler which may be used as needed as well, for wheezing or shortness of breath .  Nightly singulair may also be helpful with your allergy symptoms, which may help your breathing.  Continue to follow with your primary care provider for management as needed.  Any worsening of symptoms, shortness of breath , chest pain , sweating, weakness, or otherwise worsening please go to the ER.

## 2018-07-27 NOTE — Telephone Encounter (Signed)
Having severe GERD, will start medication Call if note better.

## 2018-07-27 NOTE — ED Triage Notes (Addendum)
Per pt he has been having some congestion with dry cough and SOB x 1 month.  No chest pain.Pt has hx of allergies. Pt also called PCP and was seen E visit on Wednesday. No fevers, no chills

## 2018-08-07 ENCOUNTER — Other Ambulatory Visit: Payer: Self-pay | Admitting: Physician Assistant

## 2018-08-07 MED FILL — AMLODIPINE BESYLATE 10 MG T: 10 | 90 days supply | Qty: 90 | Fill #0

## 2018-08-07 MED FILL — OLMESARTAN MEDOXOMIL 20 MG: 20 | 90 days supply | Qty: 90 | Fill #0

## 2018-08-07 MED FILL — ROSUVASTATIN CALCIUM 40 MG: 40 | 90 days supply | Qty: 90 | Fill #0

## 2018-08-07 MED FILL — metFORMIN HCL 500 MG TABS: 500 | 90 days supply | Qty: 180 | Fill #0

## 2018-08-07 MED FILL — LEVOTHYROXINE 88 MCG TABLET: 88 | 90 days supply | Qty: 90 | Fill #0

## 2018-08-31 ENCOUNTER — Encounter: Payer: Self-pay | Admitting: Physician Assistant

## 2018-10-04 ENCOUNTER — Other Ambulatory Visit: Payer: Self-pay

## 2018-10-04 ENCOUNTER — Encounter: Payer: Self-pay | Admitting: Physician Assistant

## 2018-10-04 ENCOUNTER — Ambulatory Visit (INDEPENDENT_AMBULATORY_CARE_PROVIDER_SITE_OTHER): Payer: No Typology Code available for payment source | Admitting: Physician Assistant

## 2018-10-04 DIAGNOSIS — J0110 Acute frontal sinusitis, unspecified: Secondary | ICD-10-CM | POA: Diagnosis not present

## 2018-10-04 DIAGNOSIS — I10 Essential (primary) hypertension: Secondary | ICD-10-CM

## 2018-10-04 DIAGNOSIS — F419 Anxiety disorder, unspecified: Secondary | ICD-10-CM

## 2018-10-04 DIAGNOSIS — R079 Chest pain, unspecified: Secondary | ICD-10-CM | POA: Diagnosis not present

## 2018-10-04 DIAGNOSIS — F329 Major depressive disorder, single episode, unspecified: Secondary | ICD-10-CM

## 2018-10-04 DIAGNOSIS — E119 Type 2 diabetes mellitus without complications: Secondary | ICD-10-CM

## 2018-10-04 DIAGNOSIS — F32A Depression, unspecified: Secondary | ICD-10-CM

## 2018-10-04 LAB — LIPID PANEL
Chol/HDL Ratio: 3.9 ratio (ref 0.0–5.0)
Cholesterol, Total: 155 mg/dL (ref 100–199)
HDL: 40 mg/dL (ref >39)
LDL Calculated: 103 mg/dL — ABNORMAL HIGH (ref 0–99)
Triglycerides: 59 mg/dL (ref 0–149)
VLDL Cholesterol Cal: 12 mg/dL (ref 5–40)

## 2018-10-04 LAB — BAYER DCA HB A1C WAIVED: HB A1C (BAYER DCA - WAIVED): 6.3 % (ref <7.0)

## 2018-10-04 MED ORDER — CITALOPRAM HYDROBROMIDE 20 MG PO TABS: 20.0000 mg | ORAL_TABLET | Freq: Every day | ORAL | 5 refills | Status: DC

## 2018-10-04 MED ORDER — AMOXICILLIN 500 MG PO CAPS: 1000.0000 mg | ORAL_CAPSULE | Freq: Two times a day (BID) | ORAL | 0 refills | Status: DC

## 2018-10-04 MED ORDER — PREDNISONE 10 MG (21) PO TBPK: ORAL_TABLET | ORAL | 0 refills | Status: DC

## 2018-10-04 MED FILL — CITALOPRAM HBR 20 MG TABLET: 20 | 30 days supply | Qty: 30 | Fill #0

## 2018-10-04 NOTE — Progress Notes (Signed)
Telephone visit  Subjective: NW:GNFAO pain, allergies PCP: Terald Sleeper, PA-C [id number] R Markuson is a 43 y.o. male calls for telephone consult today. Patient provides verbal consent for consult held via phone.  Patient is identified with 2 separate identifiers.  At this time the entire area is on COVID-19 social distancing and stay home orders are in place.  Patient is of higher risk and therefore we are performing this by a virtual method.  Location of patient: home Location of provider: HOME Others present for call: no  He has had a couple of episodes this week with chest pain, dyspnea and weakness after working a more strenuous position at work. 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 He feels very weak after this happens.  He does have family history of cardiac disease.  He has had very hard to control blood pressure in the past.  In the recent year or 2 he has had fairly good control.  He is also good control of his diabetes in the past year.  We will have labs performed as soon as possible.  But I do feel that he should have cardiology evaluation due to his risk factors.  This patient has had many days of sinus headache and postnasal drainage. There is copious drainage at times. Denies any fever at this time. There has been a history of sinus infections in the past.  No history of sinus surgery. There is cough at night. It has become more prevalent in recent days.    ROS: Per HPI  Allergies  Allergen Reactions  . Atorvastatin Other (See Comments)    Elevated liver enzymes  . Lisinopril Cough  . Cymbalta [Duloxetine Hcl] Rash   Past Medical History:  Diagnosis Date  . Anxiety   . Depression   . Diabetes mellitus without complication ([place])   . Hyperlipidemia   . Hypertension   . Hypothyroidism   . Obesity   . OSA (obstructive sleep apnea)   . Sinus congestion     Current Outpatient Medications:  .  albuterol (PROAIR HFA) 108 (90 Base) MCG/ACT  inhaler, Inhale 1-2 puffs into the lungs every 6 (six) hours as needed for wheezing or shortness of breath., Disp: 1 Inhaler, Rfl: 0 .  amLODipine (NORVASC) 10 MG tablet, Take 1 tablet (10 mg total) by mouth daily., Disp: 90 tablet, Rfl: 3 .  amoxicillin (AMOXIL) 500 MG capsule, Take 2 capsules (1,000 mg total) by mouth 2 (two) times daily., Disp: 40 capsule, Rfl: 0 .  aspirin 81 MG chewable tablet, Chew 81 mg by mouth daily., Disp: , Rfl:  .  fluticasone (FLONASE) 50 MCG/ACT nasal spray, Place 1 spray into both nostrils 2 (two) times daily., Disp: 16 g, Rfl: 6 .  hydrochlorothiazide (HYDRODIURIL) 25 MG tablet, Take 1 tablet (25 mg total) by mouth daily., Disp: 90 tablet, Rfl: 1 .  ibuprofen (ADVIL) 800 MG tablet, Take 1 tablet (800 mg total) by mouth every 8 (eight) hours as needed for moderate pain., Disp: 60 tablet, Rfl: 1 .  levothyroxine (SYNTHROID) 88 MCG tablet, TAKE 1 TABLET (88 MCG TOTAL) BY MOUTH DAILY., Disp: 90 tablet, Rfl: 3 .  loratadine (CLARITIN) 10 MG tablet, Take 1 tablet (10 mg total) by mouth daily., Disp: , Rfl:  .  metFORMIN (GLUCOPHAGE) 500 MG tablet, Take 1 tablet (500 mg total) by mouth 2 (two) times daily with a meal., Disp: 180 tablet, Rfl: 1 .  methocarbamol (ROBAXIN) 500  MG tablet, Take 1 tablet (500 mg total) by mouth every 8 (eight) hours as needed for muscle spasms., Disp: 60 tablet, Rfl: 2 .  montelukast (SINGULAIR) 10 MG tablet, Take 1 tablet (10 mg total) by mouth at bedtime., Disp: 30 tablet, Rfl: 0 .  olmesartan (BENICAR) 20 MG tablet, Take 1 tablet (20 mg total) by mouth daily., Disp: 90 tablet, Rfl: 1 .  pantoprazole (PROTONIX) 40 MG tablet, Take 1 tablet (40 mg total) by mouth daily., Disp: 30 tablet, Rfl: 3 .  predniSONE (STERAPRED UNI-PAK 21 TAB) 10 MG (21) TBPK tablet, As directed x 6 days, Disp: 21 tablet, Rfl: 0 .  rosuvastatin (CRESTOR) 40 MG tablet, Take 1 tablet (40 mg total) by mouth daily., Disp: 90 tablet, Rfl: 3  Assessment/ Plan: 43 y.o. male    1. Chest pain on exertion - Ambulatory referral to Cardiology  2. Acute non-recurrent frontal sinusitis Amoxicillin 500 mg 2 tablets twice daily A Sterapred 5 mg six-day Dosepak take as directed  3. HTN (hypertension), benign - Lipid panel - Bayer DCA Hb A1c Waived  4. Type 2 diabetes mellitus without complication, without long-term current use of insulin (HCC) - Lipid panel - Bayer DCA Hb A1c Waived  5. Anxiety and depression Citalopram 20 mg 1 daily  Check in 4 weeks   Continue all other maintenance medications as listed above.  Start time: 7:56 AM End time: 9:17 AM  Meds ordered this encounter  Medications  . amoxicillin (AMOXIL) 500 MG capsule    Sig: Take 2 capsules (1,000 mg total) by mouth 2 (two) times daily.    Dispense:  40 capsule    Refill:  0    Order Specific Question:   Supervising Provider    Answer:   Raliegh IpGOTTSCHALK, ASHLY M [[id number]]  . predniSONE (STERAPRED UNI-PAK 21 TAB) 10 MG (21) TBPK tablet    Sig: As directed x 6 days    Dispense:  21 tablet    Refill:  0    Order Specific Question:   Supervising Provider    Answer:   Raliegh IpGOTTSCHALK, ASHLY M [[id number]]    Prudy FeelerAngel Noland Pizano PA-C [facility] [phone]

## 2018-10-06 ENCOUNTER — Encounter: Payer: Self-pay | Admitting: Physician Assistant

## 2018-10-08 ENCOUNTER — Other Ambulatory Visit: Payer: Self-pay

## 2018-10-08 ENCOUNTER — Ambulatory Visit (INDEPENDENT_AMBULATORY_CARE_PROVIDER_SITE_OTHER): Payer: No Typology Code available for payment source | Admitting: Cardiovascular Disease

## 2018-10-08 ENCOUNTER — Encounter: Payer: Self-pay | Admitting: Cardiovascular Disease

## 2018-10-08 VITALS — BP 160/94 | HR 94 | Temp 98.7°F | Ht 72.0 in | Wt 295.0 lb

## 2018-10-08 DIAGNOSIS — R0602 Shortness of breath: Secondary | ICD-10-CM

## 2018-10-08 DIAGNOSIS — F419 Anxiety disorder, unspecified: Secondary | ICD-10-CM

## 2018-10-08 DIAGNOSIS — I10 Essential (primary) hypertension: Secondary | ICD-10-CM

## 2018-10-08 DIAGNOSIS — R0789 Other chest pain: Secondary | ICD-10-CM

## 2018-10-08 NOTE — Patient Instructions (Signed)
Medication Instructions:  Continue all current medications.  Labwork: none  Testing/Procedures: none  Follow-Up: 3 months   Any Other Special Instructions Will Be Listed Below (If Applicable).  If you need a refill on your cardiac medications before your next appointment, please call your pharmacy.  

## 2018-10-08 NOTE — Progress Notes (Signed)
CARDIOLOGY CONSULT NOTE  Patient ID: [name] MRN: [id number] DOB/AGE: 41-Jul-1977 41 y.o.  Admit date: (Not on file) Primary Physician: Terald Sleeper, PA-C  Reason for Consultation: Chest pain  HPI: [name] is a 43 y.o. male who is being seen today for the evaluation of chest pain at the request of Terald Sleeper, PA-C.   I reviewed office notes dated 10/04/2018.  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  Past medical history includes hypertension and hyperlipidemia.  He underwent cardiac catheterization on 09/26/2011 which demonstrated normal coronary arteries.  This was performed for a false positive stress test revealing anterior wall ischemia.  He told me about that particular day at work when all the symptoms occurred.  He had been working a strenuous job which was very fast-paced.  He was doing a lot of lifting and turning of his body.  He developed some bilateral chest tightness and also felt dizzy.  He said it was extremely hot and humid within the factory.  He drank some coffee that morning and a diet [facility] but does not remember drinking any water.  He also has a history of anxiety and said he was anxious before doing the job.  Afterwards he went out to his truck and went home and drank some water with the air conditioning on and began to feel better.  He still had some residual fatigue.  He saw his PCP who restarted Celexa.  He already feels like his symptoms have significantly improved.  He thinks his symptoms may have been due to a combination of dehydration and anxiety as well as musculoskeletal fatigue.  He has quit drinking coffee and is trying to drink more water.  He has lost 25 pounds.  He is a non-smoker.  I reviewed labs from May 2020 which demonstrated unremarkable CBC and comprehensive metabolic panel.  Lipids were markedly  elevated at that time but he is on Crestor 40 mg and repeat lipids on 10/04/2018 demonstrated much improved control.  ECG performed in the office today which I ordered and personally reviewed demonstrates sinus rhythm with PACs, incomplete right bundle branch block, and nonspecific T wave abnormalities.  Family history: Father had an MI in his mid 34s but drank alcohol and was a chronic smoker.   Allergies  Allergen Reactions  . Atorvastatin Other (See Comments)    Elevated liver enzymes  . Lisinopril Cough  . Cymbalta [Duloxetine Hcl] Rash    Current Outpatient Medications  Medication Sig Dispense Refill  . amLODipine (NORVASC) 10 MG tablet Take 1 tablet (10 mg total) by mouth daily. 90 tablet 3  . amoxicillin (AMOXIL) 500 MG capsule Take 2 capsules (1,000 mg total) by mouth 2 (two) times daily. 40 capsule 0  . aspirin 81 MG chewable tablet Chew 81 mg by mouth daily.    . citalopram (CELEXA) 20 MG tablet Take 1 tablet (20 mg total) by mouth daily. 30 tablet 5  . fluticasone (FLONASE) 50 MCG/ACT nasal spray Place 1 spray into both nostrils 2 (two) times daily. 16 g 6  . hydrochlorothiazide (HYDRODIURIL) 25 MG tablet Take 1 tablet (25 mg total) by mouth daily. 90 tablet 1  . ibuprofen (ADVIL) 800 MG tablet Take 1 tablet (800 mg total) by mouth every 8 (eight) hours as needed for moderate pain. 60 tablet 1  .  levothyroxine (SYNTHROID) 88 MCG tablet TAKE 1 TABLET (88 MCG TOTAL) BY MOUTH DAILY. 90 tablet 3  . loratadine (CLARITIN) 10 MG tablet Take 1 tablet (10 mg total) by mouth daily.    . metFORMIN (GLUCOPHAGE) 500 MG tablet Take 1 tablet (500 mg total) by mouth 2 (two) times daily with a meal. 180 tablet 1  . methocarbamol (ROBAXIN) 500 MG tablet Take 1 tablet (500 mg total) by mouth every 8 (eight) hours as needed for muscle spasms. 60 tablet 2  . olmesartan (BENICAR) 20 MG tablet Take 1 tablet (20 mg total) by mouth daily. 90 tablet 1  . pantoprazole (PROTONIX) 40 MG tablet Take 1  tablet (40 mg total) by mouth daily. 30 tablet 3  . rosuvastatin (CRESTOR) 40 MG tablet Take 1 tablet (40 mg total) by mouth daily. 90 tablet 3  . albuterol (PROAIR HFA) 108 (90 Base) MCG/ACT inhaler Inhale 1-2 puffs into the lungs every 6 (six) hours as needed for wheezing or shortness of breath. 1 Inhaler 0   No current facility-administered medications for this visit.     Past Medical History:  Diagnosis Date  . Anxiety   . Depression   . Diabetes mellitus without complication (HCC)   . Hyperlipidemia   . Hypertension   . Hypothyroidism   . Obesity   . OSA (obstructive sleep apnea)   . Sinus congestion     Past Surgical History:  Procedure Laterality Date  . FRACTURE SURGERY     right arch  . HERNIA REPAIR  1990   Right ingruial & umbilical [place]   . LEFT HEART CATHETERIZATION WITH CORONARY ANGIOGRAM N/A 09/26/2011   Procedure: LEFT HEART CATHETERIZATION WITH CORONARY ANGIOGRAM;  Surgeon: Pamella PertJagadeesh R Ganji, MD;  [facility];  Service: Cardiovascular;  Laterality: N/A;  . Repair Right Arm Fracture  1996   [place]   . UVULOPALATOPHARYNGOPLASTY (UPPP)/TONSILLECTOMY/SEPTOPLASTY      Social History   Socioeconomic History  . Marital status: Married    Spouse name: Not on file  . Number of children: Not on file  . Years of education: Not on file  . Highest education level: Not on file  Occupational History  . Occupation: Designer, television/film setmachine operator [employer]    Employer: [employer]    Comment: [place], [place]  Social Needs  . Financial resource strain: Not on file  . Food insecurity    Worry: Not on file    Inability: Not on file  . Transportation needs    Medical: Not on file    Non-medical: Not on file  Tobacco Use  . Smoking status: Never Smoker  . Smokeless tobacco: Never Used  Substance and Sexual Activity  . Alcohol use: No  . Drug use: No  . Sexual activity: Not on file  Lifestyle  . Physical activity    Days per week: Not on file    Minutes per  session: Not on file  . Stress: Not on file  Relationships  . Social Musicianconnections    Talks on phone: Not on file    Gets together: Not on file    Attends religious service: Not on file    Active member of club or organization: Not on file    Attends meetings of clubs or organizations: Not on file    Relationship status: Not on file  . Intimate partner violence    Fear of current or ex partner: Not on file    Emotionally abused: Not on file  Physically abused: Not on file    Forced sexual activity: Not on file  Other Topics Concern  . Not on file  Social History Narrative  . Not on file      Current Meds  Medication Sig  . amLODipine (NORVASC) 10 MG tablet Take 1 tablet (10 mg total) by mouth daily.  Marland Kitchen. amoxicillin (AMOXIL) 500 MG capsule Take 2 capsules (1,000 mg total) by mouth 2 (two) times daily.  Marland Kitchen. aspirin 81 MG chewable tablet Chew 81 mg by mouth daily.  . citalopram (CELEXA) 20 MG tablet Take 1 tablet (20 mg total) by mouth daily.  . fluticasone (FLONASE) 50 MCG/ACT nasal spray Place 1 spray into both nostrils 2 (two) times daily.  . hydrochlorothiazide (HYDRODIURIL) 25 MG tablet Take 1 tablet (25 mg total) by mouth daily.  Marland Kitchen. ibuprofen (ADVIL) 800 MG tablet Take 1 tablet (800 mg total) by mouth every 8 (eight) hours as needed for moderate pain.  Marland Kitchen. levothyroxine (SYNTHROID) 88 MCG tablet TAKE 1 TABLET (88 MCG TOTAL) BY MOUTH DAILY.  Marland Kitchen. loratadine (CLARITIN) 10 MG tablet Take 1 tablet (10 mg total) by mouth daily.  . metFORMIN (GLUCOPHAGE) 500 MG tablet Take 1 tablet (500 mg total) by mouth 2 (two) times daily with a meal.  . methocarbamol (ROBAXIN) 500 MG tablet Take 1 tablet (500 mg total) by mouth every 8 (eight) hours as needed for muscle spasms.  Marland Kitchen. olmesartan (BENICAR) 20 MG tablet Take 1 tablet (20 mg total) by mouth daily.  . pantoprazole (PROTONIX) 40 MG tablet Take 1 tablet (40 mg total) by mouth daily.  . rosuvastatin (CRESTOR) 40 MG tablet Take 1 tablet (40 mg  total) by mouth daily.  . [DISCONTINUED] montelukast (SINGULAIR) 10 MG tablet Take 1 tablet (10 mg total) by mouth at bedtime.      Review of systems complete and found to be negative unless listed above in HPI    Physical exam Blood pressure (!) 160/94, pulse 94, temperature 98.7 F (37.1 C), height 6' (1.829 m), weight 295 lb (133.8 kg), SpO2 96 %. General: NAD Neck: No JVD, no thyromegaly or thyroid nodule.  Lungs: Clear to auscultation bilaterally with normal respiratory effort. CV: Nondisplaced PMI. Regular rate and rhythm, normal S1/S2, no S3/S4, no murmur.  No peripheral edema.  No carotid bruit.    Abdomen: Soft, nontender, no distention.  Skin: Intact without lesions or rashes.  Neurologic: Alert and oriented x 3.  Psych: Normal affect. Extremities: No clubbing or cyanosis.  HEENT: Normal.   ECG: Most recent ECG reviewed.   Labs: Lab Results  Component Value Date/Time   K 4.5 07/22/2018 09:27 AM   BUN 14 07/22/2018 09:27 AM   CREATININE 1.15 07/22/2018 09:27 AM   CREATININE 1.33 09/24/2012 05:33 PM   ALT 22 07/22/2018 09:27 AM   TSH 1.760 07/22/2018 09:27 AM   HGB 15.1 07/22/2018 09:27 AM     Lipids: Lab Results  Component Value Date/Time   LDLCALC 103 (H) 10/04/2018 08:27 AM   LDLCALC 156 (H) 09/24/2012 05:33 PM   LDLDIRECT 208.6 03/26/2013 09:22 AM   CHOL 155 10/04/2018 08:27 AM   CHOL 221 (H) 09/24/2012 05:33 PM   TRIG 59 10/04/2018 08:27 AM   TRIG 125 09/24/2012 05:33 PM   HDL 40 10/04/2018 08:27 AM   HDL 40 09/24/2012 05:33 PM        ASSESSMENT AND PLAN:   1.  Chest tightness and shortness of breath: He underwent a normal cardiac catheterization in  July 2013 as detailed above.  This was performed for a false positive stress test which demonstrated anterior wall ischemia which in retrospect was likely caused by body habitus.  Symptoms have improved with increased water consumption and initiation of Celexa.  It is quite possible that his symptoms  were due to combination of dehydration, anxiety, and musculoskeletal fatigue.  I do not feel cardiac testing is indicated at this time.  I will have him follow-up in 3 months and if experiences a recurrence of symptoms I would consider cardiac testing at that time.  2.  Hypertension: Blood pressure is elevated.  He said he was very anxious about today's appointment and also has whitecoat hypertension.  He takes amlodipine, olmesartan, and hydrochlorothiazide.  This will need further monitoring.  3.  Hyperlipidemia: Currently on rosuvastatin.  Lipids reviewed above.  4.  Anxiety: Symptoms have improved with initiation of Celexa by PCP.    Disposition: Follow up in 3 months  Signed: Prentice DockerSuresh Eliette Drumwright, M.D., F.A.C.C.  10/08/2018, 9:25 AM

## 2018-10-09 ENCOUNTER — Ambulatory Visit (INDEPENDENT_AMBULATORY_CARE_PROVIDER_SITE_OTHER): Payer: No Typology Code available for payment source | Admitting: Family

## 2018-10-09 ENCOUNTER — Telehealth: Payer: Self-pay | Admitting: Physician Assistant

## 2018-10-09 ENCOUNTER — Encounter: Payer: Self-pay | Admitting: Family

## 2018-10-09 ENCOUNTER — Other Ambulatory Visit: Payer: Self-pay

## 2018-10-09 ENCOUNTER — Telehealth: Payer: Self-pay | Admitting: Cardiovascular Disease

## 2018-10-09 VITALS — BP 135/82 | HR 92 | Temp 98.8°F | Ht 72.0 in | Wt 292.2 lb

## 2018-10-09 DIAGNOSIS — F410 Panic disorder [episodic paroxysmal anxiety] without agoraphobia: Secondary | ICD-10-CM | POA: Diagnosis not present

## 2018-10-09 DIAGNOSIS — F411 Generalized anxiety disorder: Secondary | ICD-10-CM | POA: Diagnosis not present

## 2018-10-09 MED ORDER — BUSPIRONE HCL 7.5 MG PO TABS: 7.5000 mg | ORAL_TABLET | Freq: Three times a day (TID) | ORAL | 2 refills | Status: DC | PRN

## 2018-10-09 NOTE — Telephone Encounter (Signed)
Patient informed and verbalized understanding.  Today he was diagnosed with GAD and panic attack.

## 2018-10-09 NOTE — Progress Notes (Signed)
Subjective:    Patient ID: MALACKI MCPHEARSON, male    DOB: [birth year], 43 y.o.   MRN: [id number]  Chief Complaint  Patient presents with  . Anxiety   Pt presents to the office today with complaints of increased anxiety over the last few days. He states he thought it was his BP, but that is normal today. He states he gets periods of shakiness. He states he has to go see his Cardiologists and got him worked up. He also states his job has increased his anxiety.    He has recently started Celexa 20 mg last week.  Anxiety Presents for follow-up visit. Symptoms include decreased concentration, depressed mood, excessive worry, insomnia, irritability, nervous/anxious behavior, panic and restlessness. Symptoms occur constantly. The severity of symptoms is moderate. The quality of sleep is good.        Review of Systems  Constitutional: Positive for irritability.  Psychiatric/Behavioral: Positive for decreased concentration. The patient is nervous/anxious and has insomnia.   All other systems reviewed and are negative.      Objective:   Physical Exam Vitals signs reviewed.  Constitutional:      General: He is not in acute distress.    Appearance: He is well-developed.  HENT:     Head: Normocephalic.  Eyes:     General:        Right eye: No discharge.        Left eye: No discharge.     Pupils: Pupils are equal, round, and reactive to light.  Neck:     Musculoskeletal: Normal range of motion and neck supple.     Thyroid: No thyromegaly.  Cardiovascular:     Rate and Rhythm: Normal rate and regular rhythm.     Heart sounds: Normal heart sounds. No murmur.  Pulmonary:     Effort: Pulmonary effort is normal. No respiratory distress.     Breath sounds: Normal breath sounds. No wheezing.  Abdominal:     General: Bowel sounds are normal. There is no distension.     Palpations: Abdomen is soft.     Tenderness: There is no abdominal tenderness.  Musculoskeletal: Normal range of  motion.        General: No tenderness.  Skin:    General: Skin is warm and dry.     Findings: No erythema or rash.  Neurological:     Mental Status: He is alert and oriented to person, place, and time.     Cranial Nerves: No cranial nerve deficit.     Deep Tendon Reflexes: Reflexes are normal and symmetric.  Psychiatric:        Mood and Affect: Mood is anxious.        Behavior: Behavior normal.        Thought Content: Thought content normal.        Judgment: Judgment normal.       BP 135/82   Pulse 92   Temp 98.8 F (37.1 C) (Oral)   Ht 6' (1.829 m)   Wt 292 lb 3.2 oz (132.5 kg)   BMI 39.63 kg/m      Assessment & Plan:  [name] comes in today with chief complaint of Anxiety   Diagnosis and orders addressed:  1. GAD (generalized anxiety disorder) - busPIRone (BUSPAR) 7.5 MG tablet; Take 1 tablet (7.5 mg total) by mouth 3 (three) times daily as needed.  Dispense: 90 tablet; Refill: 2  2. Panic attack - busPIRone (BUSPAR) 7.5 MG tablet; Take 1  tablet (7.5 mg total) by mouth 3 (three) times daily as needed.  Dispense: 90 tablet; Refill: 2   Continue Celexa and keep f/u appt with PCP. Will more than likely increase to 40 mg from 20 mg  Will add buspar today as needed Stress management discussed Sedation precautions discussed Call office if symptoms worsen or do not improve   Jannifer Rodneyhristy Najee Cowens, FNP

## 2018-10-09 NOTE — Patient Instructions (Signed)

## 2018-10-09 NOTE — Telephone Encounter (Signed)
Please ask patient if he feels comfortable returning to work. If he needs to take the week off, I am fine with writing a note.

## 2018-10-09 NOTE — Telephone Encounter (Signed)
Pt c/o chest "fullness/tightness", indigestion, and some pain in both arms ans stiffness lasting a few minutes since yesterday afternoon - says he was told to contact cardiologist if symptoms started again - pt doesn't know what HR/BP has been today - denies SOB/chest pain/dizziness - pt was off work yesterday but symptoms were bothersome this morning that pt did not go to work today - pt will walk in pcp clinic today but wanted Dr Court Joy recs if he may need cardiac testing

## 2018-10-09 NOTE — Telephone Encounter (Signed)
I would like to see what PCP evaluation shows including vital signs.  If there is any suspicion that this is cardiac in etiology, I would recommend coronary CT angiography as a nuclear stress test several years ago had a false positive result which led to a cardiac catheterization due to his body habitus.

## 2018-10-09 NOTE — Telephone Encounter (Signed)
Patient was seen in office 10-08-2018. States that he did not go to work today because yesterday afternoon he started feeling dizzy, chest tightness, pain in outside of both arms and shoulders.  Called his PCP and they told him that he could walk in to be seen also.

## 2018-10-10 ENCOUNTER — Telehealth: Payer: Self-pay | Admitting: Physician Assistant

## 2018-10-10 NOTE — Telephone Encounter (Signed)
Note given to patient from July 28 through Aug 2. Left up front for patient pick up. Patient verbalized understanding

## 2018-10-22 ENCOUNTER — Ambulatory Visit: Payer: No Typology Code available for payment source | Admitting: Physician Assistant

## 2018-10-28 ENCOUNTER — Ambulatory Visit: Payer: No Typology Code available for payment source | Admitting: Neurology

## 2018-10-28 ENCOUNTER — Encounter: Payer: Self-pay | Admitting: Neurology

## 2018-10-28 ENCOUNTER — Other Ambulatory Visit: Payer: Self-pay

## 2018-10-28 VITALS — BP 156/97 | HR 80 | Ht 72.0 in | Wt 294.0 lb

## 2018-10-28 DIAGNOSIS — R351 Nocturia: Secondary | ICD-10-CM | POA: Diagnosis not present

## 2018-10-28 DIAGNOSIS — E669 Obesity, unspecified: Secondary | ICD-10-CM

## 2018-10-28 DIAGNOSIS — G4733 Obstructive sleep apnea (adult) (pediatric): Secondary | ICD-10-CM | POA: Diagnosis not present

## 2018-10-28 DIAGNOSIS — G4719 Other hypersomnia: Secondary | ICD-10-CM | POA: Diagnosis not present

## 2018-10-28 NOTE — Progress Notes (Signed)
Subjective:    Patient ID: [name] is a 43 y.o. male.  HPI     Star Age, MD, PhD [facility] [address] P.O. [place], [place] [address]  Dear Glenard Haring,   I saw your patient, [name], upon your kind request to my sleep clinic today for initial consultation of his sleep disorder, in particular, reevaluation of his prior diagnosis of OSA.  The patient is unaccompanied today.  As you know, [name] is a 43 year old right-handed gentleman with an underlying medical history of hypertension, hyperlipidemia, diabetes, depression, anxiety, hypothyroidism, and obesity, who was previously diagnosed with obstructive sleep apnea and placed on CPAP therapy.  I was able to review his baseline sleep study report from 03/13/2014.  Overall AHI was 9.5/h.  He is currently no longer on CPAP therapy.  He reports that he had trouble fulfilling the compliance percentage because he used to fall asleep after work without putting the mask on.  Generally, he was not bothered by the CPAP itself or the mask.  He would like to get reevaluated because he is not waking up rested and sleep is interrupted.  He denies any telltale symptoms of RLS.  They have no pets in the household.  He has been trying to lose weight.  At the time of his sleep study in 2016 he weighed 309 pounds current weight 294.  I reviewed your office note from 04/22/2018. His Epworth sleepiness score is 10 out of 24, fatigue severity score is 12 out of 63.  He is a non-smoker, he does not drink alcohol, drinks caffeine, on average 1/day in the form of coffee.  He lives at home with his wife and 49 year old son, his older daughter just started college at [employer].  He denies recurrent morning headaches.  He has nocturia about once per average night, typically in the early morning hours.  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  Bedtime is  generally around 9.  He has to be at work at 16.  He had a tonsillectomy as an adult, he also had deviated septum surgery.   His Past Medical History Is Significant For: Past Medical History:  Diagnosis Date  . Anxiety   . Depression   . Diabetes mellitus without complication ([place])   . Hyperlipidemia   . Hypertension   . Hypothyroidism   . Obesity   . OSA (obstructive sleep apnea)   . Sinus congestion     His Past Surgical History Is Significant For: Past Surgical History:  Procedure Laterality Date  . FRACTURE SURGERY     right arch  . HERNIA REPAIR  1990   Right ingruial & umbilical [place]   . LEFT HEART CATHETERIZATION WITH CORONARY ANGIOGRAM N/A 09/26/2011   Procedure: LEFT HEART CATHETERIZATION WITH CORONARY ANGIOGRAM;  Surgeon: Laverda Page, MD;  Location: [facility] CATH LAB;  Service: Cardiovascular;  Laterality: N/A;  . Repair Right Arm Fracture  1996   [place]   . UVULOPALATOPHARYNGOPLASTY (UPPP)/TONSILLECTOMY/SEPTOPLASTY      His Family History Is Significant For: Family History  Problem Relation Age of Onset  . Hypertension Mother   . Hypertension Father   . Heart attack Father   . Breast cancer Paternal Grandmother   . Diabetes Paternal Grandmother   . Kidney disease Maternal Grandfather   . Down syndrome Brother     His Social History Is Significant  For: Social History   Socioeconomic History  . Marital status: Married    Spouse name: Not on file  . Number of children: Not on file  . Years of education: Not on file  . Highest education level: Not on file  Occupational History  . Occupation: Designer, television/film setmachine operator [employer]    Employer: [employer]    Comment: [place], [place]  Social Needs  . Financial resource strain: Not on file  . Food insecurity    Worry: Not on file    Inability: Not on file  . Transportation needs    Medical: Not on file    Non-medical: Not on file  Tobacco Use  . Smoking status: Never Smoker  . Smokeless tobacco: Never Used   Substance and Sexual Activity  . Alcohol use: No  . Drug use: No  . Sexual activity: Not on file  Lifestyle  . Physical activity    Days per week: Not on file    Minutes per session: Not on file  . Stress: Not on file  Relationships  . Social Musicianconnections    Talks on phone: Not on file    Gets together: Not on file    Attends religious service: Not on file    Active member of club or organization: Not on file    Attends meetings of clubs or organizations: Not on file    Relationship status: Not on file  Other Topics Concern  . Not on file  Social History Narrative  . Not on file    His Allergies Are:  Allergies  Allergen Reactions  . Atorvastatin Other (See Comments)    Elevated liver enzymes  . Lisinopril Cough  . Cymbalta [Duloxetine Hcl] Rash  :   His Current Medications Are:  Outpatient Encounter Medications as of 10/28/2018  Medication Sig  . amLODipine (NORVASC) 10 MG tablet Take 1 tablet (10 mg total) by mouth daily.  Marland Kitchen. aspirin 81 MG chewable tablet Chew 81 mg by mouth daily.  . busPIRone (BUSPAR) 7.5 MG tablet Take 1 tablet (7.5 mg total) by mouth 3 (three) times daily as needed.  . citalopram (CELEXA) 20 MG tablet Take 1 tablet (20 mg total) by mouth daily.  . fluticasone (FLONASE) 50 MCG/ACT nasal spray Place 1 spray into both nostrils 2 (two) times daily.  . hydrochlorothiazide (HYDRODIURIL) 25 MG tablet Take 1 tablet (25 mg total) by mouth daily.  Marland Kitchen. levothyroxine (SYNTHROID) 88 MCG tablet TAKE 1 TABLET (88 MCG TOTAL) BY MOUTH DAILY.  Marland Kitchen. loratadine (CLARITIN) 10 MG tablet Take 1 tablet (10 mg total) by mouth daily.  . metFORMIN (GLUCOPHAGE) 500 MG tablet Take 1 tablet (500 mg total) by mouth 2 (two) times daily with a meal.  . olmesartan (BENICAR) 20 MG tablet Take 1 tablet (20 mg total) by mouth daily.  . pantoprazole (PROTONIX) 40 MG tablet Take 1 tablet (40 mg total) by mouth daily.  . rosuvastatin (CRESTOR) 40 MG tablet Take 1 tablet (40 mg total) by mouth  daily.  Marland Kitchen. ibuprofen (ADVIL) 800 MG tablet Take 1 tablet (800 mg total) by mouth every 8 (eight) hours as needed for moderate pain.  . [DISCONTINUED] amoxicillin (AMOXIL) 500 MG capsule Take 2 capsules (1,000 mg total) by mouth 2 (two) times daily.   No facility-administered encounter medications on file as of 10/28/2018.   :  Review of Systems:  Out of a complete 14 point review of systems, all are reviewed and negative with the exception of these symptoms as  listed below:  Review of Systems  Neurological:       Pt presents today to discuss his sleep. Pt had a sleep study in 2016 and started cpap but his cpap had to be returned due to non compliance. He is willing to do it again. Pt does endorse snoring.  Epworth Sleepiness Scale 0= would never doze 1= slight chance of dozing 2= moderate chance of dozing 3= high chance of dozing  Sitting and reading: 1 Watching TV: 2 Sitting inactive in a public place (ex. Theater or meeting): 3 As a passenger in a car for an hour without a break: 2 Lying down to rest in the afternoon: 2 Sitting and talking to someone: 0 Sitting quietly after lunch (no alcohol): 0 In a car, while stopped in traffic: 0 Total: 10     Objective:  Neurological Exam  Physical Exam Physical Examination:   Vitals:   10/28/18 1601  BP: (!) 156/97  Pulse: 80    General Examination: The patient is a very pleasant 43 y.o. male in no acute distress. He appears well-developed and well-nourished and well groomed.   HEENT: Normocephalic, atraumatic, pupils are equal, round and reactive to light. Extraocular tracking is good without limitation to gaze excursion or nystagmus noted. Normal smooth pursuit is noted. Hearing is grossly intact. Face is symmetric with normal facial animation and normal facial sensation. Speech is clear with no dysarthria noted. There is no hypophonia. There is no lip, neck/head, jaw or voice tremor. Neck is supple with full range of passive and  active motion. There are no carotid bruits on auscultation. Oropharynx exam reveals: mild mouth dryness, adequate dental hygiene and mild airway crowding, due to Small airway entry. Mallampati is class I. Tongue protrudes centrally and palate elevates symmetrically. Tonsils are absent. He also appears to have had a partial uvulectomy.  His neck circumference is 18-7/8 inches.  He has a mild underbite.  Chest: Clear to auscultation without wheezing, rhonchi or crackles noted.  Heart: S1+S2+0, regular and normal without murmurs, rubs or gallops noted.   Abdomen: Soft, non-tender and non-distended with normal bowel sounds appreciated on auscultation.  Extremities: There is no pitting edema in the distal lower extremities bilaterally. Pedal pulses are intact.  Skin: Warm and dry without trophic changes noted.  Musculoskeletal: exam reveals no obvious joint deformities, tenderness or joint swelling or erythema.   Neurologically:  Mental status: The patient is awake, alert and oriented in all 4 spheres. His immediate and remote memory, attention, language skills and fund of knowledge are appropriate. There is no evidence of aphasia, agnosia, apraxia or anomia. Speech is clear with normal prosody and enunciation. Thought process is linear. Mood is normal and affect is normal.  Cranial nerves II - XII are as described above under HEENT exam. In addition: shoulder shrug is normal with equal shoulder height noted. Motor exam: Normal bulk, strength and tone is noted. There is no drift, tremor or rebound. Romberg is negative. Reflexes are 1+. Fine motor skills and coordination: intact with normal finger taps, normal hand movements, normal rapid alternating patting, normal foot taps and normal foot agility.  Cerebellar testing: No dysmetria or intention tremor on finger to nose testing. Heel to shin is unremarkable bilaterally. There is no truncal or gait ataxia.  Sensory exam: intact to light touch in the  upper and lower extremities.  Gait, station and balance: He stands easily. No veering to one side is noted. No leaning to one side is noted. Posture  is age-appropriate and stance is narrow based. Gait shows normal stride length and normal pace. No problems turning are noted. Tandem walk is unremarkable.   Assessment and Plan:  In summary, [name] is a very pleasant 43 y.o.-year old male  with an underlying medical history of hypertension, hyperlipidemia, diabetes, depression, anxiety, hypothyroidism, and obesity, who Presents for evaluation of his sleep disorder, in particular, reevaluation of his prior diagnosis of obstructive sleep apnea.  He reports snoring and sleep disruption, as well as nocturia and daytime somnolence. I had a long chat with the patient about my findings and the diagnosis of OSA, its prognosis and treatment options. We talked about medical treatments, surgical interventions and non-pharmacological approaches. I explained in particular the risks and ramifications of untreated moderate to severe OSA, especially with respect to developing cardiovascular disease down the Road, including congestive heart failure, difficult to treat hypertension, cardiac arrhythmias, or stroke. Even type 2 diabetes has, in part, been linked to untreated OSA. Symptoms of untreated OSA include daytime sleepiness, memory problems, mood irritability and mood disorder such as depression and anxiety, lack of energy, as well as recurrent headaches, especially morning headaches. We talked about trying to maintain a healthy lifestyle in general, as well as the importance of weight control. I encouraged the patient to eat healthy, exercise daily and keep well hydrated, to keep a scheduled bedtime and wake time routine, to not skip any meals and eat healthy snacks in between meals. I advised the patient not to drive when feeling sleepy.  For his sleep maintenance issue he is wondering if he could try melatonin.   He has never tried an over-the-counter sleep aid.  He is advised that he could try over-the-counter melatonin which is typically a little bit more helpful for sleep initiation issues but he can certainly give it a try. I recommended the following at this time: sleep study.   I explained the sleep test procedure to the patient and also outlined possible surgical and non-surgical treatment options of OSA, including the use of a custom-made dental device (which would require a referral to a specialist dentist or oral surgeon), upper airway surgical options, such as pillar implants, radiofrequency surgery, tongue base surgery, and UPPP (which would involve a referral to an ENT surgeon). Rarely, jaw surgery such as mandibular advancement may be considered.  I also explained the CPAP treatment option to the patient, who indicated that he would be willing to try CPAP if the need arises. I explained the importance of being compliant with PAP treatment, not only for insurance purposes but primarily to improve His symptoms, and for the patient's long term health benefit, including to reduce His cardiovascular risks. I answered all his questions today and the patient was in agreement. I would like to see him back after the sleep study is completed and encouraged him to call with any interim questions, concerns, problems or updates.   Thank you very much for allowing me to participate in the care of this nice patient. If I can be of any further assistance to you please do not hesitate to call me at [phone].  Sincerely,   Huston FoleySaima Lowella Kindley, MD, PhD

## 2018-10-28 NOTE — Patient Instructions (Signed)

## 2018-11-05 ENCOUNTER — Ambulatory Visit: Payer: No Typology Code available for payment source | Admitting: Physician Assistant

## 2018-11-05 MED FILL — CITALOPRAM HBR 20 MG TABLET: 20 | 30 days supply | Qty: 30 | Fill #1

## 2018-11-05 MED FILL — OLMESARTAN MEDOXOMIL 20 MG: 20 | 30 days supply | Qty: 30 | Fill #0

## 2018-11-05 MED FILL — AMLODIPINE BESYLATE 10 MG T: 10 | 90 days supply | Qty: 90 | Fill #0

## 2018-11-05 MED FILL — LEVOTHYROXINE 88 MCG TABLET: 88 | 90 days supply | Qty: 90 | Fill #0

## 2018-11-11 ENCOUNTER — Ambulatory Visit (INDEPENDENT_AMBULATORY_CARE_PROVIDER_SITE_OTHER): Payer: No Typology Code available for payment source | Admitting: Otolaryngology

## 2018-11-11 DIAGNOSIS — J343 Hypertrophy of nasal turbinates: Secondary | ICD-10-CM

## 2018-11-11 DIAGNOSIS — J342 Deviated nasal septum: Secondary | ICD-10-CM

## 2018-11-22 ENCOUNTER — Ambulatory Visit (INDEPENDENT_AMBULATORY_CARE_PROVIDER_SITE_OTHER): Payer: No Typology Code available for payment source | Admitting: Neurology

## 2018-11-22 ENCOUNTER — Other Ambulatory Visit: Payer: Self-pay

## 2018-11-22 DIAGNOSIS — E669 Obesity, unspecified: Secondary | ICD-10-CM

## 2018-11-22 DIAGNOSIS — G4733 Obstructive sleep apnea (adult) (pediatric): Secondary | ICD-10-CM

## 2018-11-22 DIAGNOSIS — R351 Nocturia: Secondary | ICD-10-CM

## 2018-11-22 DIAGNOSIS — G4719 Other hypersomnia: Secondary | ICD-10-CM

## 2018-11-22 DIAGNOSIS — G4720 Circadian rhythm sleep disorder, unspecified type: Secondary | ICD-10-CM

## 2018-11-27 ENCOUNTER — Telehealth: Payer: Self-pay

## 2018-11-27 NOTE — Addendum Note (Signed)
Addended by: Star Age on: 11/27/2018 08:40 AM   Modules accepted: Orders

## 2018-11-27 NOTE — Progress Notes (Signed)
Patient referred by Particia Nearing, PA, seen by me on 10/28/18 for re-eval of prior Dx of OSA, no longer on CPAP, diagnostic PSG on 11/22/18.   Please call and notify the patient that the recent sleep study showed overall mild OSA, AHI worse than a few years ago and severe REM related sleep apnea. I recommend treatment for this in the form of CPAP. This will require a repeat sleep study for proper titration and mask fitting and correct monitoring of the oxygen saturations. Please explain to patient. I have placed an order in the chart. Thanks.  Star Age, MD, PhD [facility])

## 2018-11-27 NOTE — Telephone Encounter (Signed)
I called pt to discuss his sleep study results. No answer, left a message asking him to call me back. 

## 2018-11-27 NOTE — Telephone Encounter (Signed)
-----   Message from Star Age, MD sent at 11/27/2018  8:40 AM EDT ----- Patient referred by Particia Nearing, PA, seen by me on 10/28/18 for re-eval of prior Dx of OSA, no longer on CPAP, diagnostic PSG on 11/22/18.   Please call and notify the patient that the recent sleep study showed overall mild OSA, AHI worse than a few years ago and severe REM related sleep apnea. I recommend treatment for this in the form of CPAP. This will require a repeat sleep study for proper titration and mask fitting and correct monitoring of the oxygen saturations. Please explain to patient. I have placed an order in the chart. Thanks.  Star Age, MD, PhD [facility])

## 2018-11-27 NOTE — Procedures (Signed)
PATIENT'S NAME:  Frank, Farley DOB:      Nov 20, 1975      MR#:    [id number]     DATE OF RECORDING: 11/22/2018 REFERRING M.D.:  Particia Nearing PA-C Study Performed:   Baseline Polysomnogram HISTORY: 43 year old man with a history of hypertension, hyperlipidemia, diabetes, depression, anxiety, hypothyroidism, and obesity, who was previously diagnosed with obstructive sleep apnea and placed on CPAP therapy. He is no longer on CPAP therapy. He would like to get reevaluated because he is not waking up rested and sleep is interrupted. The patient endorsed the Epworth Sleepiness Scale at 10 points. The patient's weight 294 pounds with a height of 72 (inches), resulting in a BMI of 39.7 kg/m2. The patient's neck circumference measured 19 inches.  CURRENT MEDICATIONS: Norvasc, ASA 81 mg, Buspar, Celexa, Flonase, Hydrodiuril, Synthroid, Claritin, Glucophage, Benicar, Protonix, Crestor, Advil, Amoxil   PROCEDURE:  This is a multichannel digital polysomnogram utilizing the Somnostar 11.2 system.  Electrodes and sensors were applied and monitored per AASM Specifications.   EEG, EOG, Chin and Limb EMG, were sampled at 200 Hz.  ECG, Snore and Nasal Pressure, Thermal Airflow, Respiratory Effort, CPAP Flow and Pressure, Oximetry was sampled at 50 Hz. Digital video and audio were recorded.      BASELINE STUDY  Lights Out was at 21:51 and Lights On at 04:31.  Total recording time (TRT) was 400.5 minutes, with a total sleep time (TST) of 363.5 minutes.   The patient's sleep latency was 7.5 minutes, which is normal. REM latency was 91 minutes, which is normal. The sleep efficiency was 90.8%.     SLEEP ARCHITECTURE: WASO (Wake after sleep onset) was 29.5 minutes with minimal sleep fragmentation noted. There were 25 minutes in Stage N1, 224.5 minutes Stage N2, 19 minutes Stage N3 and 95 minutes in Stage REM.  The percentage of Stage N1 was 6.9%, Stage N2 was 61.8%, which is mildly increased, Stage N3 was 5.2% and Stage R (REM  sleep) was 26.1%, which is mildly increased. The arousals were noted as: 21 were spontaneous, 0 were associated with PLMs, 9 were associated with respiratory events.  RESPIRATORY ANALYSIS:  There were a total of 75 respiratory events:  0 obstructive apneas, 0 central apneas and 0 mixed apneas with a total of 0 apneas and an apnea index (AI) of 0 /hour. There were 75 hypopneas with a hypopnea index of 12.4 /hour. The patient also had 0 respiratory event related arousals (RERAs).      The total APNEA/HYPOPNEA INDEX (AHI) was 12.4 /hour and the total RESPIRATORY DISTURBANCE INDEX was 12.4 /hour.  62 events occurred in REM sleep and 26 events in NREM. The REM AHI was 39.2 /hour, versus a non-REM AHI of 2.9. The patient spent 57.5 minutes of total sleep time in the supine position and 306 minutes in non-supine. The supine AHI was 11.5 versus a non-supine AHI of 12.5.  OXYGEN SATURATION & C02:  The Wake baseline 02 saturation was 94%, with the lowest being 82%. Time spent below 89% saturation equaled 47 minutes. PERIODIC LIMB MOVEMENTS: The patient had a total of 8 Periodic Limb Movements.  The Periodic Limb Movement (PLM) index was 1.3 and the PLM Arousal index was 0/hour.  Audio and video analysis did not show any abnormal or unusual movements, behaviors, phonations or vocalizations. The patient took one bathroom break. Mild snoring was noted. The EKG was in keeping with normal sinus rhythm (NSR).  Post-study, the patient indicated that sleep was the same as  usual.   IMPRESSION:  1. Obstructive Sleep Apnea (OSA) 2. Dysfunctions associated with sleep stages or arousal from sleep  RECOMMENDATIONS:  1. This study demonstrates overall mild obstructive sleep apnea, severe in REM sleep with a total AHI of 12.4/hour, REM AHI of 39.2/hour, and O2 nadir of 82%. Given the patient's medical history and sleep related complaints, and severe REM related OSA, treatment with positive airway pressure is advised; a  full-night CPAP titration study is recommended to optimize therapy. Other treatment options may include - generally speaking - avoidance of supine sleep position along with weight loss, upper airway or jaw surgery in selected patients or the use of an oral appliance in certain patients. ENT evaluation and/or consultation with a maxillofacial surgeon or dentist may be feasible in some instances.    2. This study shows some sleep fragmentation and mildly abnormal sleep stage percentages; these are nonspecific findings and per se do not signify an intrinsic sleep disorder or a cause for the patient's sleep-related symptoms. Causes include (but are not limited to) the first night effect of the sleep study, circadian rhythm disturbances, medication effect or an underlying mood disorder or medical problem.  3. The patient should be cautioned not to drive, work at heights, or operate dangerous or heavy equipment when tired or sleepy. Review and reiteration of good sleep hygiene measures should be pursued with any patient. 4. The patient [facility] for discussion of the test results, symptom and treatment compliance review, further management strategies, etc. The referring provider will be notified of the test results.  I certify that I have reviewed the entire raw data recording prior to the issuance of this report in accordance with the Standards of Accreditation of the American Academy of Sleep Medicine (AASM)   Huston FoleySaima Marckus Hanover, MD, PhD Diplomat, American Board of Neurology and Sleep Medicine (Neurology and Sleep Medicine)

## 2018-11-28 NOTE — Telephone Encounter (Signed)
Pt has called RN back, please call

## 2018-11-28 NOTE — Telephone Encounter (Signed)
I called pt. I advised pt that Dr. Rexene Alberts reviewed their sleep study results and found that pt has osa and recommends that pt be treated with a cpap. Dr. Rexene Alberts recommends that pt return for a repeat sleep study in order to properly titrate the cpap and ensure a good mask fit. Pt is agreeable to returning for a titration study. I advised pt that our sleep lab will file with pt's insurance and call pt to schedule the sleep study when we hear back from the pt's insurance regarding coverage of this sleep study. Pt verbalized understanding of results. Pt had no questions at this time but was encouraged to call back if questions arise.

## 2018-12-02 ENCOUNTER — Ambulatory Visit: Payer: No Typology Code available for payment source

## 2018-12-02 ENCOUNTER — Other Ambulatory Visit: Payer: Self-pay

## 2018-12-02 VITALS — BP 135/93 | HR 93

## 2018-12-02 DIAGNOSIS — I10 Essential (primary) hypertension: Secondary | ICD-10-CM

## 2018-12-02 NOTE — Progress Notes (Signed)
Patient here today for blood pressure check.

## 2018-12-02 NOTE — Telephone Encounter (Signed)
Pt called in again, I was able to take the call. I had another extended conversation with him regarding him regarding his sleep study results and recommendations. Pt does want to proceed with his cpap titration study. I explained to him again that our sleep lab will call him to schedule it when they get insurance authorization. Pt verbalized understanding.

## 2018-12-02 NOTE — Telephone Encounter (Signed)
Insurance was submitted on 9/17 for auth on CPAP. Still waiting to hear back from insurance. Pt will be called when we get authorization.

## 2018-12-04 ENCOUNTER — Ambulatory Visit (INDEPENDENT_AMBULATORY_CARE_PROVIDER_SITE_OTHER): Payer: No Typology Code available for payment source | Admitting: Physician Assistant

## 2018-12-04 DIAGNOSIS — I10 Essential (primary) hypertension: Secondary | ICD-10-CM | POA: Diagnosis not present

## 2018-12-04 DIAGNOSIS — E1121 Type 2 diabetes mellitus with diabetic nephropathy: Secondary | ICD-10-CM

## 2018-12-04 DIAGNOSIS — G4733 Obstructive sleep apnea (adult) (pediatric): Secondary | ICD-10-CM | POA: Diagnosis not present

## 2018-12-05 ENCOUNTER — Encounter: Payer: Self-pay | Admitting: Physician Assistant

## 2018-12-05 NOTE — Progress Notes (Signed)
Telephone visit  Subjective: CC: Chronic medical conditions PCP: Terald Sleeper, PA-C NTI:[name] is a 43 y.o. male calls for telephone consult today. Patient provides verbal consent for consult held via phone.  Patient is identified with 2 separate identifiers.  At this time the entire area is on COVID-19 social distancing and stay home orders are in place.  Patient is of higher risk and therefore we are performing this by a virtual method.  Location of patient: Home Location of provider: HOME Others present for call: No  This patient is having a follow-up for his chronic medical conditions which do include obstructive sleep apnea, diabetes, hypertension.  The patient was seen by neurology and they have performed his sleep study.  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  They are going to do a titration study for him to go back in and get the CPAP properly L aligned.  He also has seen ear nose and throat to see if there is any obstruction and that should be changed there.  He does have a deviated septum.  And they can repair it.  The neurologist does not feel like this is the main culprit in his sleep apnea.  The patient is doing pretty well with his sugar readings he is not having anything significantly high.  He will have some labs performed in the near future.  He states he is working every day.   ROS: Per HPI  Allergies  Allergen Reactions  . Atorvastatin Other (See Comments)    Elevated liver enzymes  . Lisinopril Cough  . Cymbalta [Duloxetine Hcl] Rash   Past Medical History:  Diagnosis Date  . Anxiety   . Depression   . Diabetes mellitus without complication ([place])   . Hyperlipidemia   . Hypertension   . Hypothyroidism   . Obesity   . OSA (obstructive sleep apnea)   . Sinus congestion     Current Outpatient Medications:  .  amLODipine (NORVASC) 10 MG tablet, Take 1 tablet (10 mg total) by mouth daily., Disp: 90 tablet, Rfl: 3 .   aspirin 81 MG chewable tablet, Chew 81 mg by mouth daily., Disp: , Rfl:  .  busPIRone (BUSPAR) 7.5 MG tablet, Take 1 tablet (7.5 mg total) by mouth 3 (three) times daily as needed., Disp: 90 tablet, Rfl: 2 .  citalopram (CELEXA) 20 MG tablet, Take 1 tablet (20 mg total) by mouth daily., Disp: 30 tablet, Rfl: 5 .  fluticasone (FLONASE) 50 MCG/ACT nasal spray, Place 1 spray into both nostrils 2 (two) times daily., Disp: 16 g, Rfl: 6 .  hydrochlorothiazide (HYDRODIURIL) 25 MG tablet, Take 1 tablet (25 mg total) by mouth daily., Disp: 90 tablet, Rfl: 1 .  ibuprofen (ADVIL) 800 MG tablet, Take 1 tablet (800 mg total) by mouth every 8 (eight) hours as needed for moderate pain., Disp: 60 tablet, Rfl: 1 .  levothyroxine (SYNTHROID) 88 MCG tablet, TAKE 1 TABLET (88 MCG TOTAL) BY MOUTH DAILY., Disp: 90 tablet, Rfl: 3 .  loratadine (CLARITIN) 10 MG tablet, Take 1 tablet (10 mg total) by mouth daily., Disp: , Rfl:  .  metFORMIN (GLUCOPHAGE) 500 MG tablet, Take 1 tablet (500 mg total) by mouth 2 (two) times daily with a meal., Disp: 180 tablet, Rfl: 1 .  olmesartan (BENICAR) 20 MG tablet, Take 1 tablet (20 mg total) by mouth daily., Disp: 90 tablet, Rfl: 1 .  pantoprazole (PROTONIX) 40 MG tablet, Take 1 tablet (  40 mg total) by mouth daily., Disp: 30 tablet, Rfl: 3 .  rosuvastatin (CRESTOR) 40 MG tablet, Take 1 tablet (40 mg total) by mouth daily., Disp: 90 tablet, Rfl: 3  Assessment/ Plan: 43 y.o. male   1. Obstructive sleep apnea Continue with neurology evaluation  2. Essential hypertension Continue maintenance medications  3. Controlled type 2 diabetes mellitus with diabetic nephropathy, without long-term current use of insulin (HCC) Continue maintenance medications   No follow-ups on file.  Continue all other maintenance medications as listed above.  Start time: 4:10 PM End time: 4:23 PM  No orders of the defined types were placed in this encounter.   Prudy Feeler PA-C [facility] [phone]

## 2018-12-09 MED FILL — HYDROCHLOROTHIAZIDE 25 MG T: 25 | 90 days supply | Qty: 90 | Fill #0

## 2018-12-09 MED FILL — OLMESARTAN MEDOXOMIL 20 MG: 20 | 30 days supply | Qty: 30 | Fill #1

## 2018-12-09 MED FILL — CITALOPRAM HBR 20 MG TABLET: 20 | 30 days supply | Qty: 30 | Fill #2

## 2019-01-03 ENCOUNTER — Encounter: Payer: Self-pay | Admitting: Cardiovascular Disease

## 2019-01-03 ENCOUNTER — Ambulatory Visit: Payer: No Typology Code available for payment source | Admitting: Cardiovascular Disease

## 2019-01-03 ENCOUNTER — Telehealth (INDEPENDENT_AMBULATORY_CARE_PROVIDER_SITE_OTHER): Payer: No Typology Code available for payment source | Admitting: Cardiovascular Disease

## 2019-01-03 ENCOUNTER — Telehealth: Payer: Self-pay | Admitting: Cardiovascular Disease

## 2019-01-03 VITALS — Ht 72.0 in | Wt 292.0 lb

## 2019-01-03 DIAGNOSIS — F419 Anxiety disorder, unspecified: Secondary | ICD-10-CM

## 2019-01-03 DIAGNOSIS — R0789 Other chest pain: Secondary | ICD-10-CM

## 2019-01-03 DIAGNOSIS — G4733 Obstructive sleep apnea (adult) (pediatric): Secondary | ICD-10-CM

## 2019-01-03 DIAGNOSIS — I10 Essential (primary) hypertension: Secondary | ICD-10-CM

## 2019-01-03 DIAGNOSIS — R0602 Shortness of breath: Secondary | ICD-10-CM

## 2019-01-03 NOTE — Progress Notes (Signed)
Virtual Visit via Telephone Note   This visit type was conducted due to national recommendations for restrictions regarding the COVID-19 Pandemic (e.g. social distancing) in an effort to limit this patient's exposure and mitigate transmission in our community.  Due to his co-morbid illnesses, this patient is at least at moderate risk for complications without adequate follow up.  This format is felt to be most appropriate for this patient at this time.  The patient did not have access to video technology/had technical difficulties with video requiring transitioning to audio format only (telephone).  All issues noted in this document were discussed and addressed.  No physical exam could be performed with this format.  Please refer to the patient's chart for his  consent to [facility].   Date:  01/03/2019   ID:  [name], DOB Feb 23, 1976, MRN [id number]  Patient Location: Home Provider Location: Office  PCP:  Remus LofflerJones, Angel S, PA-C  Cardiologist:  Prentice DockerSuresh Zianne Schubring, MD  Electrophysiologist:  None   Evaluation Performed:  Follow-Up Visit  Chief Complaint:  Chest pain  History of Present Illness:    [name] is a 43 y.o. male with a history of chest tightness and shortness of breath, hypertension, hyperlipidemia, and anxiety.  Past medical history includes hypertension and hyperlipidemia.  He underwent cardiac catheterization on 09/26/2011 which demonstrated normal coronary arteries.  This was performed for a false positive stress test revealing anterior wall ischemia.  He has had some occasional dizziness but otherwise feels well, and attributes this to mask wearing.  He denies chest pain and shortness of breath.  He's been walking.   Past Medical History:  Diagnosis Date  . Anxiety   . Depression   . Diabetes mellitus without complication (HCC)   . Hyperlipidemia   . Hypertension   . Hypothyroidism   . Obesity   . OSA (obstructive sleep apnea)    . Sinus congestion    Past Surgical History:  Procedure Laterality Date  . FRACTURE SURGERY     right arch  . HERNIA REPAIR  1990   Right ingruial & umbilical [place]   . LEFT HEART CATHETERIZATION WITH CORONARY ANGIOGRAM N/A 09/26/2011   Procedure: LEFT HEART CATHETERIZATION WITH CORONARY ANGIOGRAM;  Surgeon: Pamella PertJagadeesh R Ganji, MD;  [facility];  Service: Cardiovascular;  Laterality: N/A;  . Repair Right Arm Fracture  1996   [place]   . UVULOPALATOPHARYNGOPLASTY (UPPP)/TONSILLECTOMY/SEPTOPLASTY       Current Meds  Medication Sig  . amLODipine (NORVASC) 10 MG tablet Take 1 tablet (10 mg total) by mouth daily.  Marland Kitchen. aspirin EC 81 MG tablet Take 81 mg by mouth. Only takes occasionally.  . busPIRone (BUSPAR) 7.5 MG tablet Take 1 tablet (7.5 mg total) by mouth 3 (three) times daily as needed.  . citalopram (CELEXA) 20 MG tablet Take 1 tablet (20 mg total) by mouth daily.  . fluticasone (FLONASE) 50 MCG/ACT nasal spray Place 1 spray into both nostrils 2 (two) times daily.  . hydrochlorothiazide (HYDRODIURIL) 25 MG tablet Take 1 tablet (25 mg total) by mouth daily.  Marland Kitchen. levothyroxine (SYNTHROID) 88 MCG tablet TAKE 1 TABLET (88 MCG TOTAL) BY MOUTH DAILY.  Marland Kitchen. loratadine (CLARITIN) 10 MG tablet Take 1 tablet (10 mg total) by mouth daily.  . metFORMIN (GLUCOPHAGE) 500 MG tablet Take 1 tablet (500 mg total) by mouth 2 (two) times daily with a meal.  . olmesartan (BENICAR) 20 MG tablet Take 1 tablet (20 mg total) by mouth  daily.  . pantoprazole (PROTONIX) 40 MG tablet Take 40 mg by mouth as needed.  . rosuvastatin (CRESTOR) 40 MG tablet Take 1 tablet (40 mg total) by mouth daily.     Allergies:   Atorvastatin, Lisinopril, and Cymbalta [duloxetine hcl]   Social History   Tobacco Use  . Smoking status: Never Smoker  . Smokeless tobacco: Never Used  Substance Use Topics  . Alcohol use: No  . Drug use: No     Family Hx: The patient's family history includes Breast cancer in his  paternal grandmother; Diabetes in his paternal grandmother; Down syndrome in his brother; Heart attack in his father; Hypertension in his father and mother; Kidney disease in his maternal grandfather.  ROS:   Please see the history of present illness.     All other systems reviewed and are negative.   Prior CV studies:   The following studies were reviewed today:  Reviewed above  Labs/Other Tests and Data Reviewed:    EKG:  No ECG reviewed.  Recent Labs: 07/22/2018: ALT 22; BUN 14; Creatinine, Ser 1.15; Hemoglobin 15.1; Platelets 319; Potassium 4.5; Sodium 139; TSH 1.760   Recent Lipid Panel Lab Results  Component Value Date/Time   CHOL 155 10/04/2018 08:27 AM   CHOL 221 (H) 09/24/2012 05:33 PM   TRIG 59 10/04/2018 08:27 AM   TRIG 125 09/24/2012 05:33 PM   HDL 40 10/04/2018 08:27 AM   HDL 40 09/24/2012 05:33 PM   CHOLHDL 3.9 10/04/2018 08:27 AM   CHOLHDL 4 10/29/2015 08:38 AM   LDLCALC 103 (H) 10/04/2018 08:27 AM   LDLCALC 156 (H) 09/24/2012 05:33 PM   LDLDIRECT 208.6 03/26/2013 09:22 AM    Wt Readings from Last 3 Encounters:  01/03/19 292 lb (132.5 kg)  10/28/18 294 lb (133.4 kg)  10/09/18 292 lb 3.2 oz (132.5 kg)     Objective:    Vital Signs:  Ht 6' (1.829 m)   Wt 292 lb (132.5 kg)   BMI 39.60 kg/m    VITAL SIGNS:  reviewed  ASSESSMENT & PLAN:    1.  Chest tightness and shortness of breath: He underwent a normal cardiac catheterization in July 2013 as detailed above.  This was performed for a false positive stress test which demonstrated anterior wall ischemia which in retrospect was likely caused by body habitus. He has decreased caffeine consumption. Symptoms have improved with increased water consumption and initiation of Celexa. He has been scheduled for a sleep study.  It is quite possible that his symptoms were due to combination of dehydration, anxiety, and musculoskeletal fatigue.  I do not feel cardiac testing is indicated at this time.    2.   Hypertension: He has has whitecoat hypertension.  He takes amlodipine, olmesartan, and hydrochlorothiazide.  He has been scheduled for a sleep study. He has decreased caffeine consumption.  3.  Hyperlipidemia: Currently on rosuvastatin.  Lipids reviewed above.  4.  Anxiety: Symptoms have improved with initiation of Celexa by PCP.  5. OSA: He has been scheduled for a sleep study. He has used CPAP in the past.   COVID-19 Education: The signs and symptoms of COVID-19 were discussed with the patient and how to seek care for testing (follow up with PCP or arrange E-visit).  The importance of social distancing was discussed today.  Time:   Today, I have spent 7 minutes with the patient with telehealth technology discussing the above problems.     Medication Adjustments/Labs and Tests Ordered: Current medicines are  reviewed at length with the patient today.  Concerns regarding medicines are outlined above.   Tests Ordered: No orders of the defined types were placed in this encounter.   Medication Changes: No orders of the defined types were placed in this encounter.   Follow Up:  prn  Signed, Kate Sable, MD  01/03/2019 4:17 PM    [facility]

## 2019-01-03 NOTE — Telephone Encounter (Signed)
Virtual Visit Pre-Appointment Phone Call  "(Name), I am calling you today to discuss your upcoming appointment. We are currently trying to limit exposure to the virus that causes COVID-19 by seeing patients at home rather than in the office."  1. "What is the BEST phone number to call the day of the visit?" -   [phone]  2. Do you have or have access to (through a family member/friend) a smartphone with video capability that we can use for your visit?" a. If yes - list this number in appt notes as cell (if different from BEST phone #) and list the appointment type as a VIDEO visit in appointment notes b. If no - list the appointment type as a PHONE visit in appointment notes  3. Confirm consent - "In the setting of the current Covid19 crisis, you are scheduled for a (phone or video) visit with your provider on (date) at (time).  Just as we do with many in-office visits, in order for you to participate in this visit, we must obtain consent.  If you'd like, I can send this to your mychart (if signed up) or email for you to review.  Otherwise, I can obtain your verbal consent now.  All virtual visits are billed to your insurance company just like a normal visit would be.  By agreeing to a virtual visit, we'd like you to understand that the technology does not allow for your provider to perform an examination, and thus may limit your provider's ability to fully assess your condition. If your provider identifies any concerns that need to be evaluated in person, we will make arrangements to do so.  Finally, though the technology is pretty good, we cannot assure that it will always work on either your or our end, and in the setting of a video visit, we may have to convert it to a phone-only visit.  In either situation, we cannot ensure that we have a secure connection.  Are you willing to proceed?" STAFF: Did the patient verbally acknowledge consent to telehealth visit? Document YES/NO here:  YES  4. Advise patient to be prepared - "Two hours prior to your appointment, go ahead and check your blood pressure, pulse, oxygen saturation, and your weight (if you have the equipment to check those) and write them all down. When your visit starts, your provider will ask you for this information. If you have an Apple Watch or Kardia device, please plan to have heart rate information ready on the day of your appointment. Please have a pen and paper handy nearby the day of the visit as well."  5. Give patient instructions for MyChart download to smartphone OR Doximity/Doxy.me as below if video visit (depending on what platform provider is using)  6. Inform patient they will receive a phone call 15 minutes prior to their appointment time (may be from unknown caller ID) so they should be prepared to answer    TELEPHONE CALL NOTE  [name] has been deemed a candidate for a follow-up tele-health visit to limit community exposure during the Covid-19 pandemic. I spoke with the patient via phone to ensure availability of phone/video source, confirm preferred email & phone number, and discuss instructions and expectations.  I reminded [name] to be prepared with any vital sign and/or heart rhythm information that could potentially be obtained via home monitoring, at the time of his visit. I reminded [name] to expect a phone call prior to his visit.  Lynnda Child Slaughter 01/03/2019 10:59 AM   INSTRUCTIONS FOR DOWNLOADING THE MYCHART APP TO SMARTPHONE  - The patient must first make sure to have activated MyChart and know their login information - If Apple, go to [employer] and type in MyChart in the search bar and download the app. If Android, ask patient to go to [employer] and type in [place] in the search bar and download the app. The app is free but as with any other app downloads, their phone may require them to verify saved payment information or Apple/Android password.   - The patient will need to then log into the app with their MyChart username and password, and select [facility] as their healthcare provider to link the account. When it is time for your visit, go to the MyChart app, find appointments, and click Begin Video Visit. Be sure to Select Allow for your device to access the Microphone and Camera for your visit. You will then be connected, and your provider will be with you shortly.  **If they have any issues connecting, or need assistance please contact MyChart service desk [phone])**  **If using a computer, in order to ensure the best quality for their visit they will need to use either of the following Internet Browsers: [employer], or Google Chrome**  IF USING DOXIMITY or DOXY.ME - The patient will receive a link just prior to their visit by text.     FULL LENGTH CONSENT FOR TELE-HEALTH VISIT   I hereby voluntarily request, consent and authorize [facility] and its employed or contracted physicians, physician assistants, nurse practitioners or other licensed health care professionals (the Practitioner), to provide me with telemedicine health care services (the Services") as deemed necessary by the treating Practitioner. I acknowledge and consent to receive the Services by the Practitioner via telemedicine. I understand that the telemedicine visit will involve communicating with the Practitioner through live audiovisual communication technology and the disclosure of certain medical information by electronic transmission. I acknowledge that I have been given the opportunity to request an in-person assessment or other available alternative prior to the telemedicine visit and am voluntarily participating in the telemedicine visit.  I understand that I have the right to withhold or withdraw my consent to the use of telemedicine in the course of my care at any time, without affecting my right to future care or treatment, and that  the Practitioner or I may terminate the telemedicine visit at any time. I understand that I have the right to inspect all information obtained and/or recorded in the course of the telemedicine visit and may receive copies of available information for a reasonable fee.  I understand that some of the potential risks of receiving the Services via telemedicine include:   Delay or interruption in medical evaluation due to technological equipment failure or disruption;  Information transmitted may not be sufficient (e.g. poor resolution of images) to allow for appropriate medical decision making by the Practitioner; and/or   In rare instances, security protocols could fail, causing a breach of personal health information.  Furthermore, I acknowledge that it is my responsibility to provide information about my medical history, conditions and care that is complete and accurate to the best of my ability. I acknowledge that Practitioner's advice, recommendations, and/or decision may be based on factors not within their control, such as incomplete or inaccurate data provided by me or distortions of diagnostic images or specimens that may result from electronic transmissions. I understand that the  practice of medicine is not an Chief Strategy Officer and that Practitioner makes no warranties or guarantees regarding treatment outcomes. I acknowledge that I will receive a copy of this consent concurrently upon execution via email to the email address I last provided but may also request a printed copy by calling the office of [place].    I understand that my insurance will be billed for this visit.   I have read or had this consent read to me.  I understand the contents of this consent, which adequately explains the benefits and risks of the Services being provided via telemedicine.   I have been provided ample opportunity to ask questions regarding this consent and the Services and have had my questions answered to  my satisfaction.  I give my informed consent for the services to be provided through the use of telemedicine in my medical care  By participating in this telemedicine visit I agree to the above.

## 2019-01-03 NOTE — Patient Instructions (Signed)
Medication Instructions:  Continue all current medications.  Labwork: none  Testing/Procedures: none  Follow-Up: As needed.    Any Other Special Instructions Will Be Listed Below (If Applicable).  If you need a refill on your cardiac medications before your next appointment, please call your pharmacy.  

## 2019-01-10 MED FILL — CITALOPRAM HBR 20 MG TABLET: 20 | 30 days supply | Qty: 30 | Fill #3

## 2019-01-10 MED FILL — OLMESARTAN MEDOXOMIL 20 MG: 20 | 30 days supply | Qty: 30 | Fill #2

## 2019-01-15 ENCOUNTER — Other Ambulatory Visit: Payer: Self-pay

## 2019-01-15 ENCOUNTER — Other Ambulatory Visit (HOSPITAL_COMMUNITY)
Admission: RE | Admit: 2019-01-15 | Discharge: 2019-01-15 | Disposition: A | Discharge status: Home / Self Care | Payer: No Typology Code available for payment source | Source: Ambulatory Visit | Attending: Neurology | Admitting: Neurology

## 2019-01-15 DIAGNOSIS — Z01812 Encounter for preprocedural laboratory examination: Secondary | ICD-10-CM | POA: Insufficient documentation

## 2019-01-15 DIAGNOSIS — Z20828 Contact with and (suspected) exposure to other viral communicable diseases: Secondary | ICD-10-CM | POA: Diagnosis not present

## 2019-01-15 LAB — SARS CORONAVIRUS 2 (TAT 6-24 HRS): SARS Coronavirus 2: NEGATIVE

## 2019-01-17 ENCOUNTER — Other Ambulatory Visit: Payer: Self-pay

## 2019-01-17 ENCOUNTER — Ambulatory Visit (INDEPENDENT_AMBULATORY_CARE_PROVIDER_SITE_OTHER): Payer: No Typology Code available for payment source | Admitting: Neurology

## 2019-01-17 DIAGNOSIS — G4733 Obstructive sleep apnea (adult) (pediatric): Secondary | ICD-10-CM | POA: Diagnosis not present

## 2019-01-17 DIAGNOSIS — E669 Obesity, unspecified: Secondary | ICD-10-CM

## 2019-01-17 DIAGNOSIS — R9431 Abnormal electrocardiogram [ECG] [EKG]: Secondary | ICD-10-CM

## 2019-01-17 DIAGNOSIS — G4720 Circadian rhythm sleep disorder, unspecified type: Secondary | ICD-10-CM

## 2019-01-17 DIAGNOSIS — R351 Nocturia: Secondary | ICD-10-CM

## 2019-01-17 DIAGNOSIS — G4719 Other hypersomnia: Secondary | ICD-10-CM

## 2019-01-28 ENCOUNTER — Telehealth: Payer: Self-pay

## 2019-01-28 NOTE — Telephone Encounter (Signed)
-----   Message from Star Age, MD sent at 01/28/2019  8:12 AM EST ----- Patient referred by Particia Nearing, PA, seen by me on 10/28/18 for re-eval of prior Dx of OSA, no longer on CPAP, diagnostic PSG on 11/22/18.  Patient had a CPAP titration study on 01/17/19.  Please call and inform patient that I have entered an order for treatment with positive airway pressure (PAP) treatment for obstructive sleep apnea (OSA). He did well during the latest sleep study with BiPAP. We will, therefore, arrange for a machine for home use through a DME (durable medical equipment) company of His choice; and I will see the patient back in follow-up in about 10 weeks. Please also explain to the patient that I will be looking out for compliance data, which can be downloaded from the machine (stored on an SD card, that is inserted in the machine) or via remote access through a modem, that is built into the machine. At the time of the followup appointment we will discuss sleep study results and how it is going with PAP treatment at home. Please advise patient to bring His machine at the time of the first FU visit, even though this is cumbersome. Bringing the machine for every visit after that will likely not be needed, but often helps for the first visit to troubleshoot if needed. Please re-enforce the importance of compliance with treatment and the need for [place] to monitor compliance data - often an insurance requirement and actually good feedback for the patient as far as how they are doing.  Also remind patient, that any interim PAP machine or mask issues should be first addressed with the DME company, as they can often help better with technical and mask fit issues. Please ask if patient has a preference regarding DME company.  Please also make sure, the patient has a follow-up appointment with me in about 10 weeks from the setup date, thanks. May see one of our nurse practitioners if needed for proper timing of the FU appointment.   Please fax or rout report to the referring provider. Thanks,   Star Age, MD, PhD [facility])

## 2019-01-28 NOTE — Progress Notes (Signed)
Patient referred by Particia Nearing, PA, seen by me on 10/28/18 for re-eval of prior Dx of OSA, no longer on CPAP, diagnostic PSG on 11/22/18.  Patient had a CPAP titration study on 01/17/19.  Please call and inform patient that I have entered an order for treatment with positive airway pressure (PAP) treatment for obstructive sleep apnea (OSA). He did well during the latest sleep study with BiPAP. We will, therefore, arrange for a machine for home use through a DME (durable medical equipment) company of His choice; and I will see the patient back in follow-up in about 10 weeks. Please also explain to the patient that I will be looking out for compliance data, which can be downloaded from the machine (stored on an SD card, that is inserted in the machine) or via remote access through a modem, that is built into the machine. At the time of the followup appointment we will discuss sleep study results and how it is going with PAP treatment at home. Please advise patient to bring His machine at the time of the first FU visit, even though this is cumbersome. Bringing the machine for every visit after that will likely not be needed, but often helps for the first visit to troubleshoot if needed. Please re-enforce the importance of compliance with treatment and the need for [place] to monitor compliance data - often an insurance requirement and actually good feedback for the patient as far as how they are doing.  Also remind patient, that any interim PAP machine or mask issues should be first addressed with the DME company, as they can often help better with technical and mask fit issues. Please ask if patient has a preference regarding DME company.  Please also make sure, the patient has a follow-up appointment with me in about 10 weeks from the setup date, thanks. May see one of our nurse practitioners if needed for proper timing of the FU appointment.  Please fax or rout report to the referring provider. Thanks,   Star Age, MD, PhD [facility])

## 2019-01-28 NOTE — Procedures (Signed)
PATIENT'S NAME:  [name], [name] DOB:      [birth year]      MR#:    [id number]     DATE OF RECORDING: 01/17/2019 REFERRING M.D.:  Particia Nearing, PA-C Study Performed:   CPAP  Titration HISTORY: 43 year old man with a history of hypertension, hyperlipidemia, diabetes, depression, anxiety, hypothyroidism, and obesity, who presents for a full night titration study. His baseline sleep study from 11/22/18 showed an AHI of 12.4/hour, REM AHI of 39.2/hour, supine AHI of 11.5/hour, and O2 nadir of 82%. The patient endorsed the Epworth Sleepiness Scale at 10 points. His BMI is 39.7 kg/m2. The patient's neck circumference measured 19 inches.  CURRENT MEDICATIONS: Norvasc, ASA 81mg , Buspar, Celexa, Flonase, Hydrodiuril, Synthroid, Claritn, Glucophage, Benicar, Protonix, Crestor, Advil  PROCEDURE:  This is a multichannel digital polysomnogram utilizing the SomnoStar 11.2 system.  Electrodes and sensors were applied and monitored per AASM Specifications.   EEG, EOG, Chin and Limb EMG, were sampled at 200 Hz.  ECG, Snore and Nasal Pressure, Thermal Airflow, Respiratory Effort, CPAP Flow and Pressure, Oximetry was sampled at 50 Hz. Digital video and audio were recorded.      The patient was fitted with a large F20 FFM. CPAP was initiated at 6 cm H20 with heated humidity per AASM standards and pressure was advanced to 15 cm. Due to higher pressure requirement, his treatment modality was changed to BiPAP of 1/12 cmH20 because of hypopneas, apneas and desaturations.  At a BiPAP pressure of 16/12 cmH20, there was a reduction of the AHI to 0/hour with non-supine REM sleep achieved and O2 nadir of 95%.   Lights Out was at 22:06 and Lights On at 04:10. Total recording time (TRT) was 364.5 minutes, with a total sleep time (TST) of 322 minutes. The patient's sleep latency was 11 minutes. REM latency was 81.5 minutes.  The sleep efficiency was 88.3 %.    SLEEP ARCHITECTURE: WASO (Wake after sleep onset) was 12 minutes.  There were  17 minutes in Stage N1, 158 minutes Stage N2, 14.5 minutes Stage N3 and 132.5 minutes in Stage REM.  The percentage of Stage N1 was 5.3%, Stage N2 was 49.1%, Stage N3 was 4.5% and Stage R (REM sleep) was 41.1%, which is increased and in keeping with rebound.   RESPIRATORY ANALYSIS:  There was a total of 32 respiratory events: 0 obstructive apneas, 0 central apneas and 0 mixed apneas with a total of 0 apneas and an apnea index (AI) of 0 /hour. There were 32 hypopneas with a hypopnea index of 6./hour. The patient also had 0 respiratory event related arousals (RERAs).      The total APNEA/HYPOPNEA INDEX  (AHI) was 6. /hour and the total RESPIRATORY DISTURBANCE INDEX was 6. /hour  9 events occurred in REM sleep and 23 events in NREM. The REM AHI was 4.1 /hour versus a non-REM AHI of 7.3 /hour.  The patient spent 153 minutes of total sleep time in the supine position and 169 minutes in non-supine. The supine AHI was 12.5, versus a non-supine AHI of 0.0.  OXYGEN SATURATION & C02:  The baseline 02 saturation was 94%, with the lowest being 85%. Time spent below 89% saturation equaled 3 minutes.  PERIODIC LIMB MOVEMENTS:  The patient had a total of 0 Periodic Limb Movements. The Periodic Limb Movement (PLM) index was 0 and the PLM Arousal index was 0 /hour.  Audio and video analysis did not show any abnormal or unusual movements, behaviors, phonations or vocalizations. The patient took  1 bathroom break. The EKG was in keeping with normal sinus rhythm (NSR) with occasional PACs noted.  Post-study, the patient indicated that sleep was better than usual.   IMPRESSION:   1. Obstructive Sleep Apnea (OSA) 2. Non-specific abnormal EKG   RECOMMENDATIONS:   1. This study demonstrates resolution of the patient's obstructive sleep apnea with BiPAP therapy. I will, therefore, start the patient on home a BiPAP of 16/12 cm via large FFM with heated humidity. The patient should be reminded to be fully compliant with  PAP therapy to improve sleep related symptoms and decrease long term cardiovascular risks. The patient should be reminded, that it may take up to 3 months to get fully used to using PAP with all planned sleep. The earlier full compliance is achieved, the better long term compliance tends to be. Please note that untreated obstructive sleep apnea may carry additional perioperative morbidity. Patients with significant obstructive sleep apnea should receive perioperative PAP therapy and the surgeons and particularly the anesthesiologist should be informed of the diagnosis and the severity of the sleep disordered breathing. 2. The study showed occasional PACs on single lead EKG; clinical correlation is recommended and consultation with cardiology may be feasible.  3. The patient should be cautioned not to drive, work at heights, or operate dangerous or heavy equipment when tired or sleepy. Review and reiteration of good sleep hygiene measures should be pursued with any patient. 4. The patient will be seen in follow-up in the sleep clinic at [facility] for discussion of the test results, symptom and treatment compliance review, further management strategies, etc. The referring provider will be notified of the test results.   I certify that I have reviewed the entire raw data recording prior to the issuance of this report in accordance with the Standards of Accreditation of the American Academy of Sleep Medicine (AASM)     Huston Foley, MD, PhD Diplomat, American Board of Neurology and Sleep Medicine (Neurology and Sleep Medicine)

## 2019-01-28 NOTE — Telephone Encounter (Signed)
I called pt. I advised pt that Dr. Rexene Alberts reviewed their sleep study results and found that pt did well with a bipap during his latest sleep study. Dr. Rexene Alberts recommends that pt start a bipap at home. I reviewed PAP compliance expectations with the pt. Pt is agreeable to starting a BiPAP. I advised pt that an order will be sent to a DME, [facility], and [facility] will call the pt within about one week after they file with the pt's insurance. [facility] will show the pt how to use the machine, fit for masks, and troubleshoot the BiPAP if needed. A follow up appt was made for insurance purposes with Amy, NP on 05/01/19 at 3:00pm. Pt verbalized understanding to arrive 15 minutes early and bring their BiPAP. A letter with all of this information in it will be mailed to the pt as a reminder. I verified with the pt that the address we have on file is correct. Pt verbalized understanding of results. Pt had no questions at this time but was encouraged to call back if questions arise. I have sent the order to [facility] and have received confirmation that they have received the order.

## 2019-01-28 NOTE — Telephone Encounter (Signed)
I called pt to discuss his sleep study results. No answer, left a message asking him to call me back. 

## 2019-01-28 NOTE — Addendum Note (Signed)
Addended by: Star Age on: 01/28/2019 08:13 AM   Modules accepted: Orders

## 2019-02-10 MED FILL — OLMESARTAN MEDOXOMIL 20 MG: 20 | 30 days supply | Qty: 30 | Fill #0

## 2019-02-10 MED FILL — CITALOPRAM HBR 20 MG TABLET: 20 | 30 days supply | Qty: 30 | Fill #4

## 2019-02-10 MED FILL — AMLODIPINE BESYLATE 10 MG T: 10 | 90 days supply | Qty: 90 | Fill #1

## 2019-03-13 ENCOUNTER — Other Ambulatory Visit: Payer: Self-pay | Admitting: Physician Assistant

## 2019-03-13 DIAGNOSIS — I10 Essential (primary) hypertension: Secondary | ICD-10-CM

## 2019-03-13 MED FILL — CITALOPRAM HBR 20 MG TABLET: 20 | 30 days supply | Qty: 30 | Fill #0

## 2019-03-17 ENCOUNTER — Telehealth: Payer: Self-pay | Admitting: Physician Assistant

## 2019-03-17 ENCOUNTER — Other Ambulatory Visit: Payer: Self-pay | Admitting: Physician Assistant

## 2019-03-17 DIAGNOSIS — I10 Essential (primary) hypertension: Secondary | ICD-10-CM

## 2019-03-17 MED ORDER — OLMESARTAN MEDOXOMIL 20 MG PO TABS: 20.0000 mg | ORAL_TABLET | Freq: Every day | ORAL | 0 refills | Status: DC

## 2019-03-17 MED FILL — OLMESARTAN MEDOXOMIL 20 MG: 20 | 90 days supply | Qty: 90 | Fill #0

## 2019-03-17 NOTE — Telephone Encounter (Signed)
Hold has been released on patient's account.  Supply of medication has been sent.  Left patient a message to call and set up an appointment.

## 2019-03-17 NOTE — Telephone Encounter (Signed)
Patient aware ntbs- will speak to billing.

## 2019-03-17 NOTE — Telephone Encounter (Signed)
What is the name of the medication? bp med  Have you contacted your pharmacy to request a refill yes  Which pharmacy would you like this sent to? [facility]   Patient notified that their request is being sent to the clinical staff for review and that they should receive a call once it is complete. If they do not receive a call within 24 hours they can check with their pharmacy or our office.    pt is completely out

## 2019-04-16 ENCOUNTER — Telehealth: Payer: Self-pay | Admitting: Physician Assistant

## 2019-04-16 MED ORDER — LEVOTHYROXINE SODIUM 88 MCG PO TABS: 88.0000 ug | ORAL_TABLET | Freq: Every day | ORAL | 0 refills | Status: DC

## 2019-04-16 MED ORDER — CITALOPRAM HYDROBROMIDE 20 MG PO TABS: 20.0000 mg | ORAL_TABLET | Freq: Every day | ORAL | 0 refills | Status: DC

## 2019-04-16 MED FILL — LEVOTHYROXINE 88 MCG TABLET: 88 | 90 days supply | Qty: 90 | Fill #1

## 2019-04-16 MED FILL — CITALOPRAM HBR 20 MG TABLET: 20 | 30 days supply | Qty: 30 | Fill #0

## 2019-04-16 NOTE — Telephone Encounter (Signed)
What is the name of the medication? Levathyroxine & Cetalipram?  Have you contacted your pharmacy to request a refill? Yes  Which pharmacy would you like this sent to? [facility]   Patient notified that their request is being sent to the clinical staff for review and that they should receive a call once it is complete. If they do not receive a call within 24 hours they can check with their pharmacy or our office.   [name]' pt.  Pt is completely out of meds. He took his last meds this morning.

## 2019-04-16 NOTE — Telephone Encounter (Signed)
Pt aware sent, has appt for 05/02/19

## 2019-05-01 ENCOUNTER — Telehealth: Payer: Self-pay | Admitting: *Deleted

## 2019-05-01 ENCOUNTER — Ambulatory Visit: Payer: Self-pay | Admitting: Family Medicine

## 2019-05-01 NOTE — Telephone Encounter (Signed)
LMVM for pt to return call to r/s todays  appt due to weather.  Left [phone] to call Friday AM or Monday to r/s.

## 2019-05-02 ENCOUNTER — Other Ambulatory Visit: Payer: Self-pay

## 2019-05-02 ENCOUNTER — Encounter: Payer: Self-pay | Admitting: Physician Assistant

## 2019-05-02 ENCOUNTER — Ambulatory Visit (INDEPENDENT_AMBULATORY_CARE_PROVIDER_SITE_OTHER): Payer: No Typology Code available for payment source | Admitting: Physician Assistant

## 2019-05-02 DIAGNOSIS — I10 Essential (primary) hypertension: Secondary | ICD-10-CM

## 2019-05-02 DIAGNOSIS — E1121 Type 2 diabetes mellitus with diabetic nephropathy: Secondary | ICD-10-CM | POA: Diagnosis not present

## 2019-05-02 DIAGNOSIS — E785 Hyperlipidemia, unspecified: Secondary | ICD-10-CM | POA: Diagnosis not present

## 2019-05-02 DIAGNOSIS — E039 Hypothyroidism, unspecified: Secondary | ICD-10-CM | POA: Diagnosis not present

## 2019-05-02 MED ORDER — AMLODIPINE BESYLATE 10 MG PO TABS: 10.0000 mg | ORAL_TABLET | Freq: Every day | ORAL | 1 refills | Status: DC

## 2019-05-02 MED ORDER — CITALOPRAM HYDROBROMIDE 20 MG PO TABS: 20.0000 mg | ORAL_TABLET | Freq: Every day | ORAL | 1 refills | Status: DC

## 2019-05-02 MED ORDER — LEVOTHYROXINE SODIUM 88 MCG PO TABS: 88.0000 ug | ORAL_TABLET | Freq: Every day | ORAL | 1 refills | Status: DC

## 2019-05-02 MED ORDER — OLMESARTAN MEDOXOMIL 20 MG PO TABS: 20.0000 mg | ORAL_TABLET | Freq: Every day | ORAL | 1 refills | Status: DC

## 2019-05-02 MED ORDER — HYDROCHLOROTHIAZIDE 25 MG PO TABS: 25.0000 mg | ORAL_TABLET | Freq: Every day | ORAL | 1 refills | Status: DC

## 2019-05-02 MED ORDER — METFORMIN HCL 500 MG PO TABS: 500.0000 mg | ORAL_TABLET | Freq: Two times a day (BID) | ORAL | 1 refills | Status: DC

## 2019-05-02 NOTE — Progress Notes (Signed)
1     Telephone visit  Subjective: CC: Recheck on chronic conditions PCP: Terald Sleeper, PA-C [id number] [name] is a 44 y.o. male calls for telephone consult today. Patient provides verbal consent for consult held via phone.  Patient is identified with 2 separate identifiers.  At this time the entire area is on COVID-19 social distancing and stay home orders are in place.  Patient is of higher risk and therefore we are performing this by a virtual method.  Location of patient: Home Location of provider: HOME Others present for call: No   Hypertension The patient does need refills on his medication.  He states he is tolerating it well.  He has not had opportunity to have his blood pressure checked.  But he states he has not felt any significant headache, chest pain, shortness of breath  Diabetes He does feel like his diabetes will probably be more out of control just from having to be home so much he feels like he has gained some weight.  He is still taking his medication.  We will have him come in for labs as soon as possible next week  Hypothyroidism He does need labs and refills on his medicine.  He denies any new symptoms of dryness, fatigue, cold intolerance.  Hyperlipidemia Patient does need refills on this medication.  We will have labs performed next week.  He denies any chest pain, shortness of breath stroke or TIA symptoms   ROS: Per HPI  Allergies  Allergen Reactions  . Atorvastatin Other (See Comments)    Elevated liver enzymes  . Lisinopril Cough  . Cymbalta [Duloxetine Hcl] Rash   Past Medical History:  Diagnosis Date  . Anxiety   . Depression   . Diabetes mellitus without complication ([place])   . Hyperlipidemia   . Hypertension   . Hypothyroidism   . Obesity   . OSA (obstructive sleep apnea)   . Sinus congestion     Current Outpatient Medications:  .  amLODipine (NORVASC) 10 MG tablet, Take 1 tablet (10 mg total) by mouth daily., Disp: 90  tablet, Rfl: 1 .  aspirin EC 81 MG tablet, Take 81 mg by mouth. Only takes occasionally., Disp: , Rfl:  .  citalopram (CELEXA) 20 MG tablet, Take 1 tablet (20 mg total) by mouth daily., Disp: 90 tablet, Rfl: 1 .  fluticasone (FLONASE) 50 MCG/ACT nasal spray, Place 1 spray into both nostrils 2 (two) times daily., Disp: 16 g, Rfl: 6 .  hydrochlorothiazide (HYDRODIURIL) 25 MG tablet, Take 1 tablet (25 mg total) by mouth daily., Disp: 90 tablet, Rfl: 1 .  levothyroxine (SYNTHROID) 88 MCG tablet, Take 1 tablet (88 mcg total) by mouth daily., Disp: 90 tablet, Rfl: 1 .  loratadine (CLARITIN) 10 MG tablet, Take 1 tablet (10 mg total) by mouth daily., Disp: , Rfl:  .  metFORMIN (GLUCOPHAGE) 500 MG tablet, Take 1 tablet (500 mg total) by mouth 2 (two) times daily with a meal., Disp: 180 tablet, Rfl: 1 .  olmesartan (BENICAR) 20 MG tablet, Take 1 tablet (20 mg total) by mouth daily., Disp: 90 tablet, Rfl: 1 .  pantoprazole (PROTONIX) 40 MG tablet, Take 40 mg by mouth as needed., Disp: , Rfl:  .  rosuvastatin (CRESTOR) 40 MG tablet, Take 1 tablet (40 mg total) by mouth daily., Disp: 90 tablet, Rfl: 3  Assessment/ Plan: 44 y.o. male   1. HTN (hypertension), benign - CBC with Differential/Platelet; Future - CMP14+EGFR; Future - Lipid panel; Future -  TSH; Future - Bayer DCA Hb A1c Waived; Future - olmesartan (BENICAR) 20 MG tablet; Take 1 tablet (20 mg total) by mouth daily.  Dispense: 90 tablet; Refill: 1 - amLODipine (NORVASC) 10 MG tablet; Take 1 tablet (10 mg total) by mouth daily.  Dispense: 90 tablet; Refill: 1  2. Controlled type 2 diabetes mellitus with diabetic nephropathy, without long-term current use of insulin (HCC) - CBC with Differential/Platelet; Future - CMP14+EGFR; Future - Lipid panel; Future - TSH; Future - Bayer DCA Hb A1c Waived; Future  3. Hypothyroidism, unspecified type - TSH; Future  4. Hyperlipidemia, unspecified hyperlipidemia type - CMP14+EGFR; Future - Lipid panel;  Future - TSH; Future   Recheck in 3 months  Continue all other maintenance medications as listed above.  Start time: 11:03 AM End time: 11:13 AM  Meds ordered this encounter  Medications  . hydrochlorothiazide (HYDRODIURIL) 25 MG tablet    Sig: Take 1 tablet (25 mg total) by mouth daily.    Dispense:  90 tablet    Refill:  1    Order Specific Question:   Supervising Provider    Answer:   Janora Norlander [[id number]]  . levothyroxine (SYNTHROID) 88 MCG tablet    Sig: Take 1 tablet (88 mcg total) by mouth daily.    Dispense:  90 tablet    Refill:  1    Order Specific Question:   Supervising Provider    Answer:   Janora Norlander [[id number]]  . metFORMIN (GLUCOPHAGE) 500 MG tablet    Sig: Take 1 tablet (500 mg total) by mouth 2 (two) times daily with a meal.    Dispense:  180 tablet    Refill:  1    Order Specific Question:   Supervising Provider    Answer:   Janora Norlander [[id number]]  . olmesartan (BENICAR) 20 MG tablet    Sig: Take 1 tablet (20 mg total) by mouth daily.    Dispense:  90 tablet    Refill:  1    Order Specific Question:   Supervising Provider    Answer:   Janora Norlander [[id number]]  . citalopram (CELEXA) 20 MG tablet    Sig: Take 1 tablet (20 mg total) by mouth daily.    Dispense:  90 tablet    Refill:  1    Order Specific Question:   Supervising Provider    Answer:   Janora Norlander [[id number]]  . amLODipine (NORVASC) 10 MG tablet    Sig: Take 1 tablet (10 mg total) by mouth daily.    Dispense:  90 tablet    Refill:  1    Order Specific Question:   Supervising Provider    Answer:   Janora Norlander [[id number]]    Particia Nearing PA-C [place] [phone]

## 2019-05-15 ENCOUNTER — Encounter: Payer: Self-pay | Admitting: Family Medicine

## 2019-05-15 ENCOUNTER — Other Ambulatory Visit: Payer: Self-pay

## 2019-05-15 ENCOUNTER — Ambulatory Visit (INDEPENDENT_AMBULATORY_CARE_PROVIDER_SITE_OTHER): Payer: No Typology Code available for payment source | Admitting: Family Medicine

## 2019-05-15 VITALS — BP 165/100 | HR 83 | Ht 72.0 in | Wt 306.0 lb

## 2019-05-15 DIAGNOSIS — G4733 Obstructive sleep apnea (adult) (pediatric): Secondary | ICD-10-CM

## 2019-05-15 DIAGNOSIS — I10 Essential (primary) hypertension: Secondary | ICD-10-CM

## 2019-05-15 MED FILL — AMLODIPINE BESYLATE 10 MG T: 10 | 90 days supply | Qty: 90 | Fill #0

## 2019-05-15 MED FILL — CITALOPRAM HBR 20 MG TABLET: 20 | 90 days supply | Qty: 90 | Fill #0

## 2019-05-15 NOTE — Patient Instructions (Signed)
Please continue using your BiPAP regularly. While your insurance requires that you use BiPAP at least 4 hours each night on 70% of the nights, I recommend, that you not skip any nights and use it throughout the night if you can. Getting used to BiPAP and staying with the treatment long term does take time and patience and discipline. Untreated obstructive sleep apnea when it is moderate to severe can have an adverse impact on cardiovascular health and raise her risk for heart disease, arrhythmias, hypertension, congestive heart failure, stroke and diabetes. Untreated obstructive sleep apnea causes sleep disruption, nonrestorative sleep, and sleep deprivation. This can have an impact on your day to day functioning and cause daytime sleepiness and impairment of cognitive function, memory loss, mood disturbance, and problems focussing. Using BiPAP regularly can improve these symptoms.   Follow up in 3 months   Sleep Apnea Sleep apnea affects breathing during sleep. It causes breathing to stop for a short time or to become shallow. It can also increase the risk of:  Heart attack.  Stroke.  Being very overweight (obese).  Diabetes.  Heart failure.  Irregular heartbeat. The goal of treatment is to help you breathe normally again. What are the causes? There are three kinds of sleep apnea:  Obstructive sleep apnea. This is caused by a blocked or collapsed airway.  Central sleep apnea. This happens when the brain does not send the right signals to the muscles that control breathing.  Mixed sleep apnea. This is a combination of obstructive and central sleep apnea. The most common cause of this condition is a collapsed or blocked airway. This can happen if:  Your throat muscles are too relaxed.  Your tongue and tonsils are too large.  You are overweight.  Your airway is too small. What increases the risk?  Being overweight.  Smoking.  Having a small airway.  Being older.  Being  male.  Drinking alcohol.  Taking medicines to calm yourself (sedatives or tranquilizers).  Having family members with the condition. What are the signs or symptoms?  Trouble staying asleep.  Being sleepy or tired during the day.  Getting angry a lot.  Loud snoring.  Headaches in the morning.  Not being able to focus your mind (concentrate).  Forgetting things.  Less interest in sex.  Mood swings.  Personality changes.  Feelings of sadness (depression).  Waking up a lot during the night to pee (urinate).  Dry mouth.  Sore throat. How is this diagnosed?  Your medical history.  A physical exam.  A test that is done when you are sleeping (sleep study). The test is most often done in a sleep lab but may also be done at home. How is this treated?   Sleeping on your side.  Using a medicine to get rid of mucus in your nose (decongestant).  Avoiding the use of alcohol, medicines to help you relax, or certain pain medicines (narcotics).  Losing weight, if needed.  Changing your diet.  Not smoking.  Using a machine to open your airway while you sleep, such as: ? An oral appliance. This is a mouthpiece that shifts your lower jaw forward. ? A CPAP device. This device blows air through a mask when you breathe out (exhale). ? An EPAP device. This has valves that you put in each nostril. ? A BPAP device. This device blows air through a mask when you breathe in (inhale) and breathe out.  Having surgery if other treatments do not work. It is   important to get treatment for sleep apnea. Without treatment, it can lead to:  High blood pressure.  Coronary artery disease.  In men, not being able to have an erection (impotence).  Reduced thinking ability. Follow these instructions at home: Lifestyle  Make changes that your doctor recommends.  Eat a healthy diet.  Lose weight if needed.  Avoid alcohol, medicines to help you relax, and some pain  medicines.  Do not use any products that contain nicotine or tobacco, such as cigarettes, e-cigarettes, and chewing tobacco. If you need help quitting, ask your doctor. General instructions  Take over-the-counter and prescription medicines only as told by your doctor.  If you were given a machine to use while you sleep, use it only as told by your doctor.  If you are having surgery, make sure to tell your doctor you have sleep apnea. You may need to bring your device with you.  Keep all follow-up visits as told by your doctor. This is important. Contact a doctor if:  The machine that you were given to use during sleep bothers you or does not seem to be working.  You do not get better.  You get worse. Get help right away if:  Your chest hurts.  You have trouble breathing in enough air.  You have an uncomfortable feeling in your back, arms, or stomach.  You have trouble talking.  One side of your body feels weak.  A part of your face is hanging down. These symptoms may be an emergency. Do not wait to see if the symptoms will go away. Get medical help right away. Call your local emergency services (911 in the [place] Do not drive yourself to the hospital. Summary  This condition affects breathing during sleep.  The most common cause is a collapsed or blocked airway.  The goal of treatment is to help you breathe normally while you sleep. This information is not intended to replace advice given to you by your health care provider. Make sure you discuss any questions you have with your health care provider. Document Revised: 12/14/2017 Document Reviewed: 10/23/2017 Elsevier Patient Education  2020 [employer]  

## 2019-05-15 NOTE — Progress Notes (Addendum)
PATIENT: [name] DOB: [birth year]  REASON FOR VISIT: follow up HISTORY FROM: patient  Chief Complaint  Patient presents with  . Follow-up    RM 1, alone. He reports that bipap is going well.     HISTORY OF PRESENT ILLNESS: Today 05/15/19 [name] is a 44 y.o. male here today for follow up for overall mild obstructive sleep apnea with severe REM sleep apnea.  Total AHI of 12.4 events per hour, REM AHI of 39.2 events per hour and oxygen nadir of 82%.  Titration study showed resolution of obstructive sleep apnea with BiPAP with pressure settings of 16/12 and a large fullface mask with heated humidity.  He reports that he is doing very well with BiPAP therapy.  He does admit that he wakes frequently at night and does not always get 4-hour usage.  He does much better on the weekends when he can rest longer.   Compliance report dated 04/15/2019 through 05/14/2019 reveals that he used BiPAP therapy 23 of the last 30 days for compliance of 77%.  He used BiPAP greater than 4 hours 15 of the last 30 days for compliance of 50%.  Average usage was 4 hours and 42 minutes.  Residual AHI was 0.8 with IPAP of 16 cm of water and EPAP of 12 cm of water.  There was no significant leak noted.  He does note that his blood pressure has been elevated for the past couple of days.  He denies chest pain, trouble breathing, numbness or tingling but has noted some blurry vision at times over the last couple days.  He has a longstanding history of hypertension treated with amlodipine 10 mg Benicar 20 mg and hydrochlorothiazide 25 mg daily.  He has taken his amlodipine this morning but did not take Benicar hydrochlorothiazide.  He typically takes all 3 agents in the morning.  He reports going to pick up a blood pressure cuff when he leaves our office and will take his medications.    HISTORY: (copied from Dr Teofilo Pod note on 10/28/2018)  Dear [name],   I saw your patient, [name], upon your kind  request to my sleep clinic today for initial consultation of his sleep disorder, in particular, reevaluation of his prior diagnosis of OSA.  The patient is unaccompanied today.  As you know, [name] is a 44 year old right-handed gentleman with an underlying medical history of hypertension, hyperlipidemia, diabetes, depression, anxiety, hypothyroidism, and obesity, who was previously diagnosed with obstructive sleep apnea and placed on CPAP therapy.  I was able to review his baseline sleep study report from 03/13/2014.  Overall AHI was 9.5/h.  He is currently no longer on CPAP therapy.  He reports that he had trouble fulfilling the compliance percentage because he used to fall asleep after work without putting the mask on.  Generally, he was not bothered by the CPAP itself or the mask.  He would like to get reevaluated because he is not waking up rested and sleep is interrupted.  He denies any telltale symptoms of RLS.  They have no pets in the household.  He has been trying to lose weight.  At the time of his sleep study in 2016 he weighed 309 pounds current weight 294.  I reviewed your office note from 04/22/2018. His Epworth sleepiness score is 10 out of 24, fatigue severity score is 12 out of 63.  He is a non-smoker, he does not drink alcohol, drinks caffeine, on average 1/day in the  form of coffee.  He lives at home with his wife and 5 year old son, his older daughter just started college at [employer].  He denies recurrent morning headaches.  He has nocturia about once per average night, typically in the early morning hours.  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  Bedtime is generally around 9.  He has to be at work at 55.  He had a tonsillectomy as an adult, he also had deviated septum surgery.    REVIEW OF SYSTEMS: Out of a complete 14 system review of symptoms, the patient complains only of the following symptoms, none and all other  reviewed systems are negative.   ALLERGIES: Allergies  Allergen Reactions  . Atorvastatin Other (See Comments)    Elevated liver enzymes  . Lisinopril Cough  . Cymbalta [Duloxetine Hcl] Rash    HOME MEDICATIONS: Outpatient Medications Prior to Visit  Medication Sig Dispense Refill  . amLODipine (NORVASC) 10 MG tablet Take 1 tablet (10 mg total) by mouth daily. 90 tablet 1  . aspirin EC 81 MG tablet Take 81 mg by mouth. Only takes occasionally.    . citalopram (CELEXA) 20 MG tablet Take 1 tablet (20 mg total) by mouth daily. 90 tablet 1  . fluticasone (FLONASE) 50 MCG/ACT nasal spray Place 1 spray into both nostrils 2 (two) times daily. 16 g 6  . hydrochlorothiazide (HYDRODIURIL) 25 MG tablet Take 1 tablet (25 mg total) by mouth daily. 90 tablet 1  . levothyroxine (SYNTHROID) 88 MCG tablet Take 1 tablet (88 mcg total) by mouth daily. 90 tablet 1  . loratadine (CLARITIN) 10 MG tablet Take 1 tablet (10 mg total) by mouth daily.    . metFORMIN (GLUCOPHAGE) 500 MG tablet Take 1 tablet (500 mg total) by mouth 2 (two) times daily with a meal. 180 tablet 1  . olmesartan (BENICAR) 20 MG tablet Take 1 tablet (20 mg total) by mouth daily. 90 tablet 1  . pantoprazole (PROTONIX) 40 MG tablet Take 40 mg by mouth as needed.    . rosuvastatin (CRESTOR) 40 MG tablet Take 1 tablet (40 mg total) by mouth daily. 90 tablet 3   No facility-administered medications prior to visit.    PAST MEDICAL HISTORY: Past Medical History:  Diagnosis Date  . Anxiety   . Depression   . Diabetes mellitus without complication ([place])   . Hyperlipidemia   . Hypertension   . Hypothyroidism   . Obesity   . OSA (obstructive sleep apnea)   . Sinus congestion     PAST SURGICAL HISTORY: Past Surgical History:  Procedure Laterality Date  . FRACTURE SURGERY     right arch  . HERNIA REPAIR  1990   Right ingruial & umbilical [place]   . LEFT HEART CATHETERIZATION WITH CORONARY ANGIOGRAM N/A 09/26/2011   Procedure:  LEFT HEART CATHETERIZATION WITH CORONARY ANGIOGRAM;  Surgeon: Laverda Page, MD;  Location: [facility] CATH LAB;  Service: Cardiovascular;  Laterality: N/A;  . Repair Right Arm Fracture  1996   [place]   . UVULOPALATOPHARYNGOPLASTY (UPPP)/TONSILLECTOMY/SEPTOPLASTY      FAMILY HISTORY: Family History  Problem Relation Age of Onset  . Hypertension Mother   . Hypertension Father   . Heart attack Father   . Breast cancer Paternal Grandmother   . Diabetes Paternal Grandmother   . Kidney disease Maternal Grandfather   . Down syndrome Brother     SOCIAL HISTORY: Social History  Socioeconomic History  . Marital status: Married    Spouse name: Not on file  . Number of children: Not on file  . Years of education: Not on file  . Highest education level: Not on file  Occupational History  . Occupation: Designer, television/film set: [employer]    Comment: [place], [place]  Tobacco Use  . Smoking status: Never Smoker  . Smokeless tobacco: Never Used  Substance and Sexual Activity  . Alcohol use: No  . Drug use: No  . Sexual activity: Not on file  Other Topics Concern  . Not on file  Social History Narrative  . Not on file   Social Determinants of Health   Financial Resource Strain:   . Difficulty of Paying Living Expenses: Not on file  Food Insecurity:   . Worried About Programme researcher, broadcasting/film/video in the Last Year: Not on file  . Ran Out of Food in the Last Year: Not on file  Transportation Needs:   . Lack of Transportation (Medical): Not on file  . Lack of Transportation (Non-Medical): Not on file  Physical Activity:   . Days of Exercise per Week: Not on file  . Minutes of Exercise per Session: Not on file  Stress:   . Feeling of Stress : Not on file  Social Connections:   . Frequency of Communication with Friends and Family: Not on file  . Frequency of Social Gatherings with Friends and Family: Not on file  . Attends Religious Services: Not on file  . Active Member of  Clubs or Organizations: Not on file  . Attends Banker Meetings: Not on file  . Marital Status: Not on file  Intimate Partner Violence:   . Fear of Current or Ex-Partner: Not on file  . Emotionally Abused: Not on file  . Physically Abused: Not on file  . Sexually Abused: Not on file      PHYSICAL EXAM  Vitals:   05/15/19 0951  BP: (!) 165/100  Pulse: 83  Weight: (!) 306 lb (138.8 kg)  Height: 6' (1.829 m)   Body mass index is 41.5 kg/m.  Generalized: Well developed, in no acute distress  Cardiology: normal rate and rhythm, no murmur noted Respiratory: Clear to auscultation bilaterally Neurological examination  Mentation: Alert oriented to time, place, history taking. Follows all commands speech and language fluent Cranial nerve II-XII: Pupils were equal round reactive to light. Extraocular movements were full, visual field were full on confrontational test. Facial sensation and strength were normal. Uvula tongue midline. Head turning and shoulder shrug  were normal and symmetric. Motor: The motor testing reveals 5 over 5 strength of all 4 extremities. Good symmetric motor tone is noted throughout.  Gait and station: Gait is normal.   DIAGNOSTIC DATA (LABS, IMAGING, TESTING) - I reviewed patient records, labs, notes, testing and imaging myself where available.  No flowsheet data found.   Lab Results  Component Value Date   WBC 5.6 07/22/2018   HGB 15.1 07/22/2018   HCT 46.0 07/22/2018   MCV 81 07/22/2018   PLT 319 07/22/2018      Component Value Date/Time   NA 139 07/22/2018 0927   K 4.5 07/22/2018 0927   CL 101 07/22/2018 0927   CO2 24 07/22/2018 0927   GLUCOSE 95 07/22/2018 0927   GLUCOSE 107 (H) 09/25/2016 1848   BUN 14 07/22/2018 0927   CREATININE 1.15 07/22/2018 0927   CREATININE 1.33 09/24/2012 1733   CALCIUM  9.2 07/22/2018 0927   PROT 7.0 07/22/2018 0927   ALBUMIN 4.2 07/22/2018 0927   AST 15 07/22/2018 0927   ALT 22 07/22/2018 0927    ALKPHOS 55 07/22/2018 0927   BILITOT 0.2 07/22/2018 0927   GFRNONAA 79 07/22/2018 0927   GFRNONAA 69 09/24/2012 1733   GFRAA 92 07/22/2018 0927   GFRAA 80 09/24/2012 1733   Lab Results  Component Value Date   CHOL 155 10/04/2018   HDL 40 10/04/2018   LDLCALC 103 (H) 10/04/2018   LDLDIRECT 208.6 03/26/2013   TRIG 59 10/04/2018   CHOLHDL 3.9 10/04/2018   Lab Results  Component Value Date   HGBA1C 6.3 10/04/2018   Lab Results  Component Value Date   VITAMINB12 315 10/29/2015   Lab Results  Component Value Date   TSH 1.760 07/22/2018       ASSESSMENT AND PLAN 44 y.o. year old male  has a past medical history of Anxiety, Depression, Diabetes mellitus without complication (HCC), Hyperlipidemia, Hypertension, Hypothyroidism, Obesity, OSA (obstructive sleep apnea), and Sinus congestion. here with     ICD-10-CM   1. OSA treated with BiPAP  G47.33   2. Essential hypertension  I10     Wallie is doing well on BiPAP therapy.  Compliance report reviewed in the office reveals acceptable daily use and suboptimal 4-hour compliance.  We have discussed the importance of using CPAP every night and for greater than 4 hours each night.  Risk of untreated sleep apnea reviewed.  He was encouraged to use CPAP every night and work on goal of 4-hour usage each night.  He may consider melatonin to help with frequent waking.  I have also encouraged him to reach out to primary care regarding elevated blood pressures.  Recheck blood pressure in the office is 170/92.  He does admit that he has missed his dose of Benicar and hydrochlorothiazide this morning.  I have advised that he take this medication as soon as he gets home.  He will recheck his blood pressure in 1 hour and call primary care if it remains elevated.  We have reviewed red flag warnings and when to seek emergency medical attention.  Well-balanced, low-sodium diet and regular exercise advised.  He will follow-up with me in 3 months, sooner if  needed.  He verbalizes understanding and agreement with this plan.   No orders of the defined types were placed in this encounter.    No orders of the defined types were placed in this encounter.     I spent 15 minutes with the patient. 50% of this time was spent counseling and educating patient on plan of care and medications.    Shawnie Dapper, FNP-C 05/15/2019, 10:37 AM [facility] [address] [place], [place] [address] [phone]  I reviewed the above note and documentation by the Nurse Practitioner and agree with the history, exam, assessment and plan as outlined above. I was available for consultation. Huston Foley, MD, PhD [facility])

## 2019-05-18 ENCOUNTER — Ambulatory Visit: Payer: No Typology Code available for payment source | Attending: Internal Medicine

## 2019-05-18 DIAGNOSIS — Z23 Encounter for immunization: Secondary | ICD-10-CM | POA: Insufficient documentation

## 2019-05-18 NOTE — Progress Notes (Signed)
   Covid-19 Vaccination Clinic  Name:  [name]    MRN: [id number] DOB: 16-Aug-1975  05/18/2019  [name] was observed post Covid-19 immunization for 15 minutes without incident. He was provided with Vaccine Information Sheet and instruction to access the V-Safe system.   [name] was instructed to call 911 with any severe reactions post vaccine: Marland Kitchen Difficulty breathing  . Swelling of face and throat  . A fast heartbeat  . A bad rash all over body  . Dizziness and weakness   Immunizations Administered    Name Date Dose VIS Date Route   Pfizer COVID-19 Vaccine 05/18/2019  5:44 PM 0.3 mL 02/21/2019 Intramuscular   Manufacturer: [employer], [employer]   Lot: [id number]   NDC: [address]-5

## 2019-05-27 ENCOUNTER — Other Ambulatory Visit: Payer: Self-pay

## 2019-05-27 ENCOUNTER — Other Ambulatory Visit (INDEPENDENT_AMBULATORY_CARE_PROVIDER_SITE_OTHER): Payer: No Typology Code available for payment source

## 2019-05-27 DIAGNOSIS — I10 Essential (primary) hypertension: Secondary | ICD-10-CM

## 2019-05-27 DIAGNOSIS — E785 Hyperlipidemia, unspecified: Secondary | ICD-10-CM

## 2019-05-27 DIAGNOSIS — E1121 Type 2 diabetes mellitus with diabetic nephropathy: Secondary | ICD-10-CM

## 2019-05-27 DIAGNOSIS — E039 Hypothyroidism, unspecified: Secondary | ICD-10-CM

## 2019-05-27 LAB — BAYER DCA HB A1C WAIVED: HB A1C (BAYER DCA - WAIVED): 6.8 % (ref <7.0)

## 2019-05-28 LAB — CBC WITH DIFFERENTIAL/PLATELET
Basophils Absolute: 0.1 10*3/uL (ref 0.0–0.2)
Basos: 1 %
EOS (ABSOLUTE): 0.1 10*3/uL (ref 0.0–0.4)
Eos: 3 %
Hematocrit: 46.4 % (ref 37.5–51.0)
Hemoglobin: 15.5 g/dL (ref 13.0–17.7)
Immature Grans (Abs): 0 10*3/uL (ref 0.0–0.1)
Immature Granulocytes: 0 %
Lymphocytes Absolute: 1.9 10*3/uL (ref 0.7–3.1)
Lymphs: 41 %
MCH: 27.8 pg (ref 26.6–33.0)
MCHC: 33.4 g/dL (ref 31.5–35.7)
MCV: 83 fL (ref 79–97)
Monocytes Absolute: 0.5 10*3/uL (ref 0.1–0.9)
Monocytes: 11 %
Neutrophils Absolute: 2 10*3/uL (ref 1.4–7.0)
Neutrophils: 44 %
Platelets: 288 10*3/uL (ref 150–450)
RBC: 5.58 x10E6/uL (ref 4.14–5.80)
RDW: 14.7 % (ref 11.6–15.4)
WBC: 4.6 10*3/uL (ref 3.4–10.8)

## 2019-05-28 LAB — CMP14+EGFR
ALT: 33 IU/L (ref 0–44)
AST: 21 IU/L (ref 0–40)
Albumin/Globulin Ratio: 1.4 (ref 1.2–2.2)
Albumin: 4.2 g/dL (ref 4.0–5.0)
Alkaline Phosphatase: 55 IU/L (ref 39–117)
BUN/Creatinine Ratio: 18 (ref 9–20)
BUN: 17 mg/dL (ref 6–24)
Bilirubin Total: 0.2 mg/dL (ref 0.0–1.2)
CO2: 26 mmol/L (ref 20–29)
Calcium: 9.5 mg/dL (ref 8.7–10.2)
Chloride: 100 mmol/L (ref 96–106)
Creatinine, Ser: 0.97 mg/dL (ref 0.76–1.27)
GFR calc Af Amer: 112 mL/min/{1.73_m2} (ref >59)
GFR calc non Af Amer: 97 mL/min/{1.73_m2} (ref >59)
Globulin, Total: 3 g/dL (ref 1.5–4.5)
Glucose: 103 mg/dL — ABNORMAL HIGH (ref 65–99)
Potassium: 4.3 mmol/L (ref 3.5–5.2)
Sodium: 140 mmol/L (ref 134–144)
Total Protein: 7.2 g/dL (ref 6.0–8.5)

## 2019-05-28 LAB — LIPID PANEL
Chol/HDL Ratio: 5.7 ratio — ABNORMAL HIGH (ref 0.0–5.0)
Cholesterol, Total: 234 mg/dL — ABNORMAL HIGH (ref 100–199)
HDL: 41 mg/dL (ref >39)
LDL Chol Calc (NIH): 177 mg/dL — ABNORMAL HIGH (ref 0–99)
Triglycerides: 90 mg/dL (ref 0–149)
VLDL Cholesterol Cal: 16 mg/dL (ref 5–40)

## 2019-05-28 LAB — TSH: TSH: 1.09 u[IU]/mL (ref 0.450–4.500)

## 2019-06-02 ENCOUNTER — Other Ambulatory Visit: Payer: Self-pay | Admitting: *Deleted

## 2019-06-02 MED ORDER — ROSUVASTATIN CALCIUM 40 MG PO TABS: 80.0000 mg | ORAL_TABLET | Freq: Every day | ORAL | 3 refills | Status: DC

## 2019-06-08 ENCOUNTER — Ambulatory Visit: Payer: No Typology Code available for payment source | Attending: Internal Medicine

## 2019-06-08 DIAGNOSIS — Z23 Encounter for immunization: Secondary | ICD-10-CM

## 2019-06-08 NOTE — Progress Notes (Signed)
   Covid-19 Vaccination Clinic  Name:  [name]    MRN: [id number] DOB: 08/21/1975  06/08/2019  [name] was observed post Covid-19 immunization for 15 minutes without incident. He was provided with Vaccine Information Sheet and instruction to access the V-Safe system.   [name] was instructed to call 911 with any severe reactions post vaccine: Marland Kitchen Difficulty breathing  . Swelling of face and throat  . A fast heartbeat  . A bad rash all over body  . Dizziness and weakness   Immunizations Administered    Name Date Dose VIS Date Route   Pfizer COVID-19 Vaccine 06/08/2019  3:39 PM 0.3 mL 02/21/2019 Intramuscular   Manufacturer: [employer], [employer]   Lot: [id number]   NDC: [address]-4

## 2019-06-20 MED FILL — OLMESARTAN MEDOXOMIL 20 MG: 20 | 90 days supply | Qty: 90 | Fill #0

## 2019-06-20 MED FILL — HYDROCHLOROTHIAZIDE 25 MG T: 25 | 90 days supply | Qty: 90 | Fill #0

## 2019-07-30 ENCOUNTER — Encounter: Payer: Self-pay | Admitting: Family Medicine

## 2019-08-25 ENCOUNTER — Ambulatory Visit: Payer: No Typology Code available for payment source | Admitting: Family Medicine

## 2019-10-01 MED FILL — OLMESARTAN MEDOXOMIL 20 MG: 20 | 90 days supply | Qty: 90 | Fill #1

## 2019-11-03 ENCOUNTER — Other Ambulatory Visit: Payer: Self-pay | Admitting: *Deleted

## 2019-11-06 ENCOUNTER — Ambulatory Visit (INDEPENDENT_AMBULATORY_CARE_PROVIDER_SITE_OTHER): Payer: No Typology Code available for payment source | Admitting: Family Medicine

## 2019-11-06 ENCOUNTER — Other Ambulatory Visit: Payer: Self-pay

## 2019-11-06 ENCOUNTER — Encounter: Payer: Self-pay | Admitting: Family Medicine

## 2019-11-06 VITALS — BP 139/86 | HR 83 | Temp 98.2°F | Ht 72.0 in | Wt 328.2 lb

## 2019-11-06 DIAGNOSIS — E1121 Type 2 diabetes mellitus with diabetic nephropathy: Secondary | ICD-10-CM | POA: Diagnosis not present

## 2019-11-06 DIAGNOSIS — R350 Frequency of micturition: Secondary | ICD-10-CM | POA: Diagnosis not present

## 2019-11-06 DIAGNOSIS — R8290 Unspecified abnormal findings in urine: Secondary | ICD-10-CM

## 2019-11-06 DIAGNOSIS — E782 Mixed hyperlipidemia: Secondary | ICD-10-CM

## 2019-11-06 LAB — URINALYSIS, COMPLETE
Bilirubin, UA: NEGATIVE
Glucose, UA: NEGATIVE
Ketones, UA: NEGATIVE
Leukocytes,UA: NEGATIVE
Nitrite, UA: NEGATIVE
Protein,UA: NEGATIVE
RBC, UA: NEGATIVE
Specific Gravity, UA: 1.025 (ref 1.005–1.030)
Urobilinogen, Ur: 1 mg/dL (ref 0.2–1.0)
pH, UA: 7 (ref 5.0–7.5)

## 2019-11-06 LAB — MICROSCOPIC EXAMINATION
Bacteria, UA: NONE SEEN
Epithelial Cells (non renal): NONE SEEN /hpf (ref 0–10)
RBC, Urine: NONE SEEN /hpf (ref 0–2)

## 2019-11-06 LAB — BAYER DCA HB A1C WAIVED: HB A1C (BAYER DCA - WAIVED): 7.2 % — ABNORMAL HIGH (ref <7.0)

## 2019-11-06 NOTE — Progress Notes (Signed)
Assessment & Plan:  1. Abnormal urine odor - Urinalysis, Complete - Urine dipstick shows negative for all components.  Micro exam: 0-5 WBC's per HPF and no bacteria. - Microscopic Examination  2. Urinary frequency - PSA, total and free  3. Mixed hyperlipidemia - Labs to assess.  - CMP14+EGFR - Lipid panel  4. Controlled type 2 diabetes mellitus with diabetic nephropathy, without long-term current use of insulin (HCC) - Medications: continue current medications until A1c results - Patient is currently taking a statin. Patient is taking an ACE-inhibitor/ARB.  - Urine Microalbumin/Creat Ratio: 07/22/2018 - CMP14+EGFR - Lipid panel - Bayer DCA Hb A1c Waived   Follow up plan: Return for establish care.  Hendricks Limes, MSN, APRN, FNP-C [facility]  Subjective:   Patient ID: [name], male    DOB: 03/05/1976, 44 y.o.   MRN: [id number]  HPI: [name] is a 44 y.o. male presenting on 11/06/2019 for abnormal urinary odor (x 1 month follow up) and Urinary Frequency  Patient c/o abnormal odor to his urine for the past month. He states sometimes he thinks it is due to not drinking enough water. Sometimes it has a sweet smell. He also reports he has been urinating frequently but in normal amounts.    ROS: Negative unless specifically indicated above in HPI.   Relevant past medical history reviewed and updated as indicated.   Allergies and medications reviewed and updated.   Current Outpatient Medications:  .  amLODipine (NORVASC) 10 MG tablet, Take 1 tablet (10 mg total) by mouth daily., Disp: 90 tablet, Rfl: 1 .  aspirin EC 81 MG tablet, Take 81 mg by mouth. Only takes occasionally., Disp: , Rfl:  .  citalopram (CELEXA) 20 MG tablet, Take 1 tablet (20 mg total) by mouth daily., Disp: 90 tablet, Rfl: 1 .  fluticasone (FLONASE) 50 MCG/ACT nasal spray, Place 1 spray into both nostrils 2 (two) times daily., Disp: 16 g, Rfl: 6 .  hydrochlorothiazide  (HYDRODIURIL) 25 MG tablet, Take 1 tablet (25 mg total) by mouth daily., Disp: 90 tablet, Rfl: 1 .  levothyroxine (SYNTHROID) 88 MCG tablet, Take 1 tablet (88 mcg total) by mouth daily., Disp: 90 tablet, Rfl: 1 .  loratadine (CLARITIN) 10 MG tablet, Take 1 tablet (10 mg total) by mouth daily., Disp: , Rfl:  .  metFORMIN (GLUCOPHAGE) 500 MG tablet, Take 1 tablet (500 mg total) by mouth 2 (two) times daily with a meal., Disp: 180 tablet, Rfl: 1 .  olmesartan (BENICAR) 20 MG tablet, Take 1 tablet (20 mg total) by mouth daily., Disp: 90 tablet, Rfl: 1 .  pantoprazole (PROTONIX) 40 MG tablet, Take 40 mg by mouth as needed., Disp: , Rfl:  .  rosuvastatin (CRESTOR) 40 MG tablet, Take 2 tablets (80 mg total) by mouth daily., Disp: 180 tablet, Rfl: 3  Allergies  Allergen Reactions  . Atorvastatin Other (See Comments)    Elevated liver enzymes  . Lisinopril Cough  . Cymbalta [Duloxetine Hcl] Rash    Objective:   BP 139/86   Pulse 83   Temp 98.2 F (36.8 C) (Temporal)   Ht 6' (1.829 m)   Wt (!) 328 lb 3.2 oz (148.9 kg)   SpO2 96%   BMI 44.51 kg/m    Physical Exam Vitals reviewed.  Constitutional:      General: He is not in acute distress.    Appearance: Normal appearance. He is morbidly obese. He is not ill-appearing, toxic-appearing or diaphoretic.  HENT:  Head: Normocephalic and atraumatic.  Eyes:     General: No scleral icterus.       Right eye: No discharge.        Left eye: No discharge.     Conjunctiva/sclera: Conjunctivae normal.  Cardiovascular:     Rate and Rhythm: Normal rate and regular rhythm.     Heart sounds: Normal heart sounds. No murmur heard.  No friction rub. No gallop.   Pulmonary:     Effort: Pulmonary effort is normal. No respiratory distress.     Breath sounds: Normal breath sounds. No stridor. No wheezing, rhonchi or rales.  Musculoskeletal:        General: Normal range of motion.     Cervical back: Normal range of motion.  Skin:    General: Skin is  warm and dry.  Neurological:     Mental Status: He is alert and oriented to person, place, and time. Mental status is at baseline.  Psychiatric:        Mood and Affect: Mood normal.        Behavior: Behavior normal.        Thought Content: Thought content normal.        Judgment: Judgment normal.

## 2019-11-07 ENCOUNTER — Other Ambulatory Visit: Payer: Self-pay | Admitting: Family Medicine

## 2019-11-07 DIAGNOSIS — E119 Type 2 diabetes mellitus without complications: Secondary | ICD-10-CM

## 2019-11-07 DIAGNOSIS — E785 Hyperlipidemia, unspecified: Secondary | ICD-10-CM

## 2019-11-07 LAB — CMP14+EGFR
ALT: 48 IU/L — ABNORMAL HIGH (ref 0–44)
AST: 26 IU/L (ref 0–40)
Albumin/Globulin Ratio: 1.5 (ref 1.2–2.2)
Albumin: 4.3 g/dL (ref 4.0–5.0)
Alkaline Phosphatase: 52 IU/L (ref 48–121)
BUN/Creatinine Ratio: 12 (ref 9–20)
BUN: 15 mg/dL (ref 6–24)
Bilirubin Total: <0.2 mg/dL (ref 0.0–1.2)
CO2: 26 mmol/L (ref 20–29)
Calcium: 9.1 mg/dL (ref 8.7–10.2)
Chloride: 103 mmol/L (ref 96–106)
Creatinine, Ser: 1.22 mg/dL (ref 0.76–1.27)
GFR calc Af Amer: 85 mL/min/{1.73_m2} (ref >59)
GFR calc non Af Amer: 73 mL/min/{1.73_m2} (ref >59)
Globulin, Total: 2.8 g/dL (ref 1.5–4.5)
Glucose: 94 mg/dL (ref 65–99)
Potassium: 4.5 mmol/L (ref 3.5–5.2)
Sodium: 141 mmol/L (ref 134–144)
Total Protein: 7.1 g/dL (ref 6.0–8.5)

## 2019-11-07 LAB — PSA, TOTAL AND FREE
PSA, Free Pct: 26.3 %
PSA, Free: 0.21 ng/mL
Prostate Specific Ag, Serum: 0.8 ng/mL (ref 0.0–4.0)

## 2019-11-07 LAB — LIPID PANEL
Chol/HDL Ratio: 5 ratio (ref 0.0–5.0)
Cholesterol, Total: 190 mg/dL (ref 100–199)
HDL: 38 mg/dL — ABNORMAL LOW (ref >39)
LDL Chol Calc (NIH): 125 mg/dL — ABNORMAL HIGH (ref 0–99)
Triglycerides: 150 mg/dL — ABNORMAL HIGH (ref 0–149)
VLDL Cholesterol Cal: 27 mg/dL (ref 5–40)

## 2019-11-07 MED ORDER — METFORMIN HCL 500 MG PO TABS: ORAL_TABLET | ORAL | 1 refills | Status: DC

## 2019-11-07 MED ORDER — EZETIMIBE 10 MG PO TABS: 10.0000 mg | ORAL_TABLET | Freq: Every day | ORAL | 1 refills | Status: DC

## 2019-11-07 MED ORDER — ROSUVASTATIN CALCIUM 40 MG PO TABS: 40.0000 mg | ORAL_TABLET | Freq: Every day | ORAL | 1 refills | Status: DC

## 2019-11-08 ENCOUNTER — Encounter: Payer: Self-pay | Admitting: Family Medicine

## 2019-11-10 ENCOUNTER — Ambulatory Visit: Payer: No Typology Code available for payment source | Admitting: Family Medicine

## 2019-11-10 ENCOUNTER — Encounter: Payer: No Typology Code available for payment source | Admitting: Nurse Practitioner

## 2019-11-27 MED FILL — CITALOPRAM HBR 20 MG TABLET: 20 | 30 days supply | Qty: 30 | Fill #0

## 2019-11-27 MED FILL — METFORMIN HCL 500 MG TABS: 500 | 90 days supply | Qty: 270 | Fill #0

## 2019-11-27 MED FILL — ROSUVASTATIN CALCIUM 40 MG: 40 | 90 days supply | Qty: 90 | Fill #0

## 2019-11-28 ENCOUNTER — Other Ambulatory Visit: Payer: Self-pay | Admitting: *Deleted

## 2019-11-28 DIAGNOSIS — E785 Hyperlipidemia, unspecified: Secondary | ICD-10-CM

## 2019-11-28 DIAGNOSIS — I10 Essential (primary) hypertension: Secondary | ICD-10-CM

## 2019-11-28 DIAGNOSIS — E119 Type 2 diabetes mellitus without complications: Secondary | ICD-10-CM

## 2019-11-28 MED ORDER — ROSUVASTATIN CALCIUM 40 MG PO TABS: 40.0000 mg | ORAL_TABLET | Freq: Every day | ORAL | 0 refills | Status: DC

## 2019-11-28 MED ORDER — AMLODIPINE BESYLATE 10 MG PO TABS: 10.0000 mg | ORAL_TABLET | Freq: Every day | ORAL | 0 refills | Status: DC

## 2019-11-28 MED FILL — AMLODIPINE BESYLATE 10 MG T: 10 | 90 days supply | Qty: 90 | Fill #0

## 2019-12-04 ENCOUNTER — Encounter: Payer: No Typology Code available for payment source | Admitting: Nurse Practitioner

## 2019-12-29 ENCOUNTER — Other Ambulatory Visit: Payer: Self-pay | Admitting: Family Medicine

## 2019-12-29 ENCOUNTER — Telehealth: Payer: Self-pay

## 2019-12-29 MED ORDER — CITALOPRAM HYDROBROMIDE 20 MG PO TABS
20.0000 mg | ORAL_TABLET | Freq: Every day | ORAL | 0 refills | Status: DC
Start: 2019-12-29 — End: 2020-01-30

## 2019-12-29 MED FILL — CITALOPRAM HBR 20 MG TABLET: 20 | 30 days supply | Qty: 30 | Fill #0

## 2019-12-29 NOTE — Telephone Encounter (Signed)
lmtcb

## 2019-12-29 NOTE — Telephone Encounter (Signed)
Patient aware of lab results.  Rx sent in for 30 days since patient has appointment sent up.

## 2020-01-02 ENCOUNTER — Other Ambulatory Visit: Payer: Self-pay | Admitting: *Deleted

## 2020-01-02 DIAGNOSIS — I10 Essential (primary) hypertension: Secondary | ICD-10-CM

## 2020-01-02 MED ORDER — OLMESARTAN MEDOXOMIL 20 MG PO TABS: 20.0000 mg | ORAL_TABLET | Freq: Every day | ORAL | 0 refills | Status: DC

## 2020-01-06 MED FILL — OLMESARTAN MEDOXOMIL 20 MG: 20 | 90 days supply | Qty: 90 | Fill #0

## 2020-01-15 ENCOUNTER — Ambulatory Visit (INDEPENDENT_AMBULATORY_CARE_PROVIDER_SITE_OTHER): Payer: No Typology Code available for payment source | Admitting: Nurse Practitioner

## 2020-01-15 ENCOUNTER — Ambulatory Visit (INDEPENDENT_AMBULATORY_CARE_PROVIDER_SITE_OTHER): Payer: No Typology Code available for payment source

## 2020-01-15 ENCOUNTER — Encounter: Payer: Self-pay | Admitting: Nurse Practitioner

## 2020-01-15 ENCOUNTER — Other Ambulatory Visit: Payer: Self-pay

## 2020-01-15 VITALS — BP 138/85 | HR 88 | Temp 98.0°F | Resp 20 | Ht 72.0 in | Wt 326.0 lb

## 2020-01-15 DIAGNOSIS — F419 Anxiety disorder, unspecified: Secondary | ICD-10-CM

## 2020-01-15 DIAGNOSIS — Z23 Encounter for immunization: Secondary | ICD-10-CM

## 2020-01-15 DIAGNOSIS — I10 Essential (primary) hypertension: Secondary | ICD-10-CM

## 2020-01-15 DIAGNOSIS — E785 Hyperlipidemia, unspecified: Secondary | ICD-10-CM

## 2020-01-15 DIAGNOSIS — E1121 Type 2 diabetes mellitus with diabetic nephropathy: Secondary | ICD-10-CM

## 2020-01-15 DIAGNOSIS — E782 Mixed hyperlipidemia: Secondary | ICD-10-CM

## 2020-01-15 DIAGNOSIS — Z0001 Encounter for general adult medical examination with abnormal findings: Secondary | ICD-10-CM | POA: Diagnosis not present

## 2020-01-15 DIAGNOSIS — F32A Depression, unspecified: Secondary | ICD-10-CM

## 2020-01-15 DIAGNOSIS — E119 Type 2 diabetes mellitus without complications: Secondary | ICD-10-CM

## 2020-01-15 DIAGNOSIS — K2100 Gastro-esophageal reflux disease with esophagitis, without bleeding: Secondary | ICD-10-CM | POA: Diagnosis not present

## 2020-01-15 DIAGNOSIS — Z0000 Encounter for general adult medical examination without abnormal findings: Secondary | ICD-10-CM

## 2020-01-15 LAB — BAYER DCA HB A1C WAIVED: HB A1C (BAYER DCA - WAIVED): 6.9 % (ref <7.0)

## 2020-01-15 MED ORDER — OLMESARTAN MEDOXOMIL 20 MG PO TABS: 20.0000 mg | ORAL_TABLET | Freq: Every day | ORAL | 0 refills | Status: DC

## 2020-01-15 MED ORDER — EZETIMIBE 10 MG PO TABS: 10.0000 mg | ORAL_TABLET | Freq: Every day | ORAL | 1 refills | Status: DC

## 2020-01-15 MED ORDER — ROSUVASTATIN CALCIUM 40 MG PO TABS: 40.0000 mg | ORAL_TABLET | Freq: Every day | ORAL | 0 refills | Status: DC

## 2020-01-15 MED ORDER — AMLODIPINE BESYLATE 10 MG PO TABS: 10.0000 mg | ORAL_TABLET | Freq: Every day | ORAL | 0 refills | Status: DC

## 2020-01-15 NOTE — Patient Instructions (Signed)
Diabetes Mellitus and Exercise Exercising regularly is important for your overall health, especially when you have diabetes (diabetes mellitus). Exercising is not only about losing weight. It has many other health benefits, such as increasing muscle strength and bone density and reducing body fat and stress. This leads to improved fitness, flexibility, and endurance, all of which result in better overall health. Exercise has additional benefits for people with diabetes, including:  Reducing appetite.  Helping to lower and control blood glucose.  Lowering blood pressure.  Helping to control amounts of fatty substances (lipids) in the blood, such as cholesterol and triglycerides.  Helping the body to respond better to insulin (improving insulin sensitivity).  Reducing how much insulin the body needs.  Decreasing the risk for heart disease by: ? Lowering cholesterol and triglyceride levels. ? Increasing the levels of good cholesterol. ? Lowering blood glucose levels. What is my activity plan? Your health care provider or certified diabetes educator can help you make a plan for the type and frequency of exercise (activity plan) that works for you. Make sure that you:  Do at least 150 minutes of moderate-intensity or vigorous-intensity exercise each week. This could be brisk walking, biking, or water aerobics. ? Do stretching and strength exercises, such as yoga or weightlifting, at least 2 times a week. ? Spread out your activity over at least 3 days of the week.  Get some form of physical activity every day. ? Do not go more than 2 days in a row without some kind of physical activity. ? Avoid being inactive for more than 30 minutes at a time. Take frequent breaks to walk or stretch.  Choose a type of exercise or activity that you enjoy, and set realistic goals.  Start slowly, and gradually increase the intensity of your exercise over time. What do I need to know about managing my  diabetes?   Check your blood glucose before and after exercising. ? If your blood glucose is 240 mg/dL (13.3 mmol/L) or higher before you exercise, check your urine for ketones. If you have ketones in your urine, do not exercise until your blood glucose returns to normal. ? If your blood glucose is 100 mg/dL (5.6 mmol/L) or lower, eat a snack containing 15-20 grams of carbohydrate. Check your blood glucose 15 minutes after the snack to make sure that your level is above 100 mg/dL (5.6 mmol/L) before you start your exercise.  Know the symptoms of low blood glucose (hypoglycemia) and how to treat it. Your risk for hypoglycemia increases during and after exercise. Common symptoms of hypoglycemia can include: ? Hunger. ? Anxiety. ? Sweating and feeling clammy. ? Confusion. ? Dizziness or feeling light-headed. ? Increased heart rate or palpitations. ? Blurry vision. ? Tingling or numbness around the mouth, lips, or tongue. ? Tremors or shakes. ? Irritability.  Keep a rapid-acting carbohydrate snack available before, during, and after exercise to help prevent or treat hypoglycemia.  Avoid injecting insulin into areas of the body that are going to be exercised. For example, avoid injecting insulin into: ? The arms, when playing tennis. ? The legs, when jogging.  Keep records of your exercise habits. Doing this can help you and your health care provider adjust your diabetes management plan as needed. Write down: ? Food that you eat before and after you exercise. ? Blood glucose levels before and after you exercise. ? The type and amount of exercise you have done. ? When your insulin is expected to peak, if you use   insulin. Avoid exercising at times when your insulin is peaking.  When you start a new exercise or activity, work with your health care provider to make sure the activity is safe for you, and to adjust your insulin, medicines, or food intake as needed.  Drink plenty of water while  you exercise to prevent dehydration or heat stroke. Drink enough fluid to keep your urine clear or pale yellow. Summary  Exercising regularly is important for your overall health, especially when you have diabetes (diabetes mellitus).  Exercising has many health benefits, such as increasing muscle strength and bone density and reducing body fat and stress.  Your health care provider or certified diabetes educator can help you make a plan for the type and frequency of exercise (activity plan) that works for you.  When you start a new exercise or activity, work with your health care provider to make sure the activity is safe for you, and to adjust your insulin, medicines, or food intake as needed. This information is not intended to replace advice given to you by your health care provider. Make sure you discuss any questions you have with your health care provider. Document Revised: 09/21/2016 Document Reviewed: 08/09/2015 Elsevier Patient Education  2020 [employer]  

## 2020-01-15 NOTE — Progress Notes (Signed)
Subjective:    Patient ID: [name], male    DOB: 1975/08/09, 44 y.o.   MRN: [id number]   Chief Complaint: annual physical exam   HPI:  1. Essential hypertension BP Readings from Last 3 Encounters:  01/15/20 138/85  11/06/19 139/86  05/15/19 (!) 165/100  Takes medication as prescribed. Check BP at home sometimes. Denies chest pain, SOB, headaches, or weakness. Does not smoke/has never smoked. Does not add salt.    2. Gastroesophageal reflux disease with esophagitis, unspecified whether hemorrhage Takes medication as prescribed. States he does not eat as late as he used to. Eats fried and greasy foods.   3. Controlled type 2 diabetes mellitus with diabetic nephropathy, without long-term current use of insulin ([place]) Lab Results  Component Value Date   HGBA1C 7.2 (H) 11/06/2019  Takes medication as prescribed. Does not check CBG at home. Does not watch carb or sugar intake.    4. Mixed hyperlipidemia Lab Results  Component Value Date   CHOL 190 11/06/2019   HDL 38 (L) 11/06/2019   LDLCALC 125 (H) 11/06/2019   LDLDIRECT 208.6 03/26/2013   TRIG 150 (H) 11/06/2019   CHOLHDL 5.0 11/06/2019  Takes medication as prescribed. Does not watch diet.    5. Morbid obesity ([place]) Wt Readings from Last 3 Encounters:  01/15/20 (!) 326 lb (147.9 kg)  11/06/19 (!) 328 lb 3.2 oz (148.9 kg)  05/15/19 (!) 306 lb (138.8 kg)   BMI Readings from Last 3 Encounters:  01/15/20 44.21 kg/m  11/06/19 44.51 kg/m  05/15/19 41.50 kg/m  Works on his feet a lot. No formal exercise.    6. Anxiety and depression   Depression screen [facility] 2/9 01/15/2020 01/15/2020 10/09/2018 04/04/2018 02/08/2018  Decreased Interest 0 0 0 0 0  Down, Depressed, Hopeless 0 0 0 0 0  PHQ - 2 Score 0 0 0 0 0  Altered sleeping 0 - - - -  Tired, decreased energy 0 - - - -  Change in appetite 1 - - - -  Feeling bad or failure about yourself  0 - - - -  Trouble concentrating 0 - - - -  Moving slowly or  fidgety/restless 0 - - - -  Suicidal thoughts 0 - - - -  PHQ-9 Score 1 - - - -  Difficult doing work/chores Not difficult at all - - - -    GAD 7 : Generalized Anxiety Score 10/09/2018  Nervous, Anxious, on Edge 1  Control/stop worrying 1  Worry too much - different things 1  Trouble relaxing 1  Restless 1  Easily annoyed or irritable 1  Afraid - awful might happen 1  Total GAD 7 Score 7   States no symptoms or issues at this time.      Outpatient Encounter Medications as of 01/15/2020  Medication Sig  . amLODipine (NORVASC) 10 MG tablet Take 1 tablet (10 mg total) by mouth daily.  Marland Kitchen aspirin EC 81 MG tablet Take 81 mg by mouth. Only takes occasionally.  . citalopram (CELEXA) 20 MG tablet Take 1 tablet (20 mg total) by mouth daily.  Marland Kitchen ezetimibe (ZETIA) 10 MG tablet Take 1 tablet (10 mg total) by mouth daily.  . fluticasone (FLONASE) 50 MCG/ACT nasal spray Place 1 spray into both nostrils 2 (two) times daily.  . hydrochlorothiazide (HYDRODIURIL) 25 MG tablet Take 1 tablet (25 mg total) by mouth daily.  Marland Kitchen levothyroxine (SYNTHROID) 88 MCG tablet Take 1 tablet (88 mcg total) by mouth daily.  Marland Kitchen  loratadine (CLARITIN) 10 MG tablet Take 1 tablet (10 mg total) by mouth daily.  . metFORMIN (GLUCOPHAGE) 500 MG tablet Take 2 tablets (1,000 mg total) by mouth daily with breakfast AND 1 tablet (500 mg total) daily with supper.  . olmesartan (BENICAR) 20 MG tablet Take 1 tablet (20 mg total) by mouth daily.  . rosuvastatin (CRESTOR) 40 MG tablet Take 1 tablet (40 mg total) by mouth daily.   No facility-administered encounter medications on file as of 01/15/2020.    Past Surgical History:  Procedure Laterality Date  . FRACTURE SURGERY     right arch  . HERNIA REPAIR  1990   Right ingruial & umbilical [place]   . LEFT HEART CATHETERIZATION WITH CORONARY ANGIOGRAM N/A 09/26/2011   Procedure: LEFT HEART CATHETERIZATION WITH CORONARY ANGIOGRAM;  Surgeon: Laverda Page, MD;  Location: [facility]  CATH LAB;  Service: Cardiovascular;  Laterality: N/A;  . Repair Right Arm Fracture  1996   [place]   . UVULOPALATOPHARYNGOPLASTY (UPPP)/TONSILLECTOMY/SEPTOPLASTY      Family History  Problem Relation Age of Onset  . Hypertension Mother   . Hypertension Father   . Heart attack Father   . Breast cancer Paternal Grandmother   . Diabetes Paternal Grandmother   . Kidney disease Maternal Grandfather   . Down syndrome Brother     New complaints: No new complaints.   Social history: Lives at home with wife and son, goes to church regularly. Thrift store shopping.   Controlled substance contract: n/a     Review of Systems  Constitutional: Negative.   HENT: Negative.   Eyes: Negative.   Respiratory: Negative.   Cardiovascular: Negative.   Gastrointestinal: Negative.   Endocrine: Negative.   Genitourinary: Negative.   Musculoskeletal: Negative.   Skin: Negative.   Allergic/Immunologic: Negative.   Neurological: Negative.   Hematological: Negative.   Psychiatric/Behavioral: Negative.   All other systems reviewed and are negative.      Objective:   Physical Exam Constitutional:      Appearance: He is obese.  HENT:     Head: Normocephalic and atraumatic.     Right Ear: Tympanic membrane, ear canal and external ear normal. There is impacted cerumen.     Left Ear: Tympanic membrane, ear canal and external ear normal. There is impacted cerumen.     Nose: Nose normal.     Mouth/Throat:     Mouth: Mucous membranes are moist.     Pharynx: Oropharynx is clear.  Eyes:     Extraocular Movements: Extraocular movements intact.     Conjunctiva/sclera: Conjunctivae normal.     Pupils: Pupils are equal, round, and reactive to light.  Cardiovascular:     Rate and Rhythm: Normal rate and regular rhythm.     Pulses: Normal pulses.     Heart sounds: Normal heart sounds.  Pulmonary:     Effort: Pulmonary effort is normal.     Breath sounds: Normal breath sounds.  Abdominal:      General: Abdomen is flat. Bowel sounds are normal.     Palpations: Abdomen is soft.  Musculoskeletal:        General: Normal range of motion.     Cervical back: Normal range of motion.  Skin:    General: Skin is warm and dry.     Capillary Refill: Capillary refill takes less than 2 seconds.  Neurological:     General: No focal deficit present.     Mental Status: He is alert and oriented to person,  place, and time. Mental status is at baseline.  Psychiatric:        Mood and Affect: Mood normal.        Behavior: Behavior normal.        Thought Content: Thought content normal.        Judgment: Judgment normal.   BP 138/85   Pulse 88   Temp 98 F (36.7 C) (Temporal)   Resp 20   Ht 6' (1.829 m)   Wt (!) 326 lb (147.9 kg)   SpO2 98%   BMI 44.21 kg/m   EKG- NSR-Mary-Margaret Hassell Done, FNP  BIl ear irrigation- TM's clear bil     Assessment & Plan:  KHYREE CARILLO comes in today with chief complaint of Annual Exam   Diagnosis and orders addressed:  1. Essential hypertension Take medication as prescribed and check blood pressure regularly. Avoid foods that are high in salt and eat a heart healthy diet.   2. Gastroesophageal reflux disease with esophagitis, unspecified whether hemorrhage Take medication as prescribed. Avoid trigger foods such as spicy or fried foods. Eat smaller meals more frequently verses large meals. Do not lie down after meals.   3. Controlled type 2 diabetes mellitus with diabetic nephropathy, without long-term current use of insulin (HCC) Take medication as prescribed and check blood glucose levels regularly. Avoid foods that are high in carb and sugars.   4. Mixed hyperlipidemia Take medication as prescribed. Avoid foods that have a high fat content and greasy.   5. Morbid obesity ([place]) Exercise regularly. Swimming and walking are great low-impact exercises that help lower blood pressure, cholesterol levels, blood glucose levels, and helps you maintain  a healthy weight.   6. Anxiety and depression Take medication as prescribed. Report any new or worsening symptoms.   Meds ordered this encounter  Medications  . olmesartan (BENICAR) 20 MG tablet    Sig: Take 1 tablet (20 mg total) by mouth daily.    Dispense:  90 tablet    Refill:  0    Order Specific Question:   Supervising Provider    Answer:   Caryl Pina A [id number]  . amLODipine (NORVASC) 10 MG tablet    Sig: Take 1 tablet (10 mg total) by mouth daily.    Dispense:  90 tablet    Refill:  0    Order Specific Question:   Supervising Provider    Answer:   Caryl Pina A [id number]  . rosuvastatin (CRESTOR) 40 MG tablet    Sig: Take 1 tablet (40 mg total) by mouth daily.    Dispense:  90 tablet    Refill:  0    Order Specific Question:   Supervising Provider    Answer:   Caryl Pina A [id number]  . ezetimibe (ZETIA) 10 MG tablet    Sig: Take 1 tablet (10 mg total) by mouth daily.    Dispense:  90 tablet    Refill:  1    Order Specific Question:   Supervising Provider    Answer:   Caryl Pina A [[id number]]   Orders Placed This Encounter  Procedures  . DG Chest 2 View    Order Specific Question:   Reason for Exam (SYMPTOM  OR DIAGNOSIS REQUIRED)    Answer:   screening    Order Specific Question:   Preferred imaging location?    Answer:   Internal  . CBC with Differential/Platelet  . CMP14+EGFR  . Lipid panel  . Bayer DCA Hb A1c Waived  .  EKG 12-Lead     Labs pending Health Maintenance reviewed Diet and exercise encouraged  Follow up plan: Follow up ion 3 months.    Mary-Margaret Hassell Done, FNP

## 2020-01-16 LAB — CMP14+EGFR
ALT: 36 IU/L (ref 0–44)
AST: 25 IU/L (ref 0–40)
Albumin/Globulin Ratio: 1.2 (ref 1.2–2.2)
Albumin: 4.1 g/dL (ref 4.0–5.0)
Alkaline Phosphatase: 60 IU/L (ref 44–121)
BUN/Creatinine Ratio: 17 (ref 9–20)
BUN: 17 mg/dL (ref 6–24)
Bilirubin Total: <0.2 mg/dL (ref 0.0–1.2)
CO2: 26 mmol/L (ref 20–29)
Calcium: 9.6 mg/dL (ref 8.7–10.2)
Chloride: 98 mmol/L (ref 96–106)
Creatinine, Ser: 1.03 mg/dL (ref 0.76–1.27)
GFR calc Af Amer: 103 mL/min/{1.73_m2} (ref >59)
GFR calc non Af Amer: 89 mL/min/{1.73_m2} (ref >59)
Globulin, Total: 3.5 g/dL (ref 1.5–4.5)
Glucose: 101 mg/dL — ABNORMAL HIGH (ref 65–99)
Potassium: 4 mmol/L (ref 3.5–5.2)
Sodium: 138 mmol/L (ref 134–144)
Total Protein: 7.6 g/dL (ref 6.0–8.5)

## 2020-01-16 LAB — CBC WITH DIFFERENTIAL/PLATELET
Basophils Absolute: 0.1 10*3/uL (ref 0.0–0.2)
Basos: 1 %
EOS (ABSOLUTE): 0.2 10*3/uL (ref 0.0–0.4)
Eos: 3 %
Hematocrit: 43.4 % (ref 37.5–51.0)
Hemoglobin: 14.1 g/dL (ref 13.0–17.7)
Immature Grans (Abs): 0 10*3/uL (ref 0.0–0.1)
Immature Granulocytes: 0 %
Lymphocytes Absolute: 2.4 10*3/uL (ref 0.7–3.1)
Lymphs: 38 %
MCH: 26.8 pg (ref 26.6–33.0)
MCHC: 32.5 g/dL (ref 31.5–35.7)
MCV: 82 fL (ref 79–97)
Monocytes Absolute: 0.8 10*3/uL (ref 0.1–0.9)
Monocytes: 12 %
Neutrophils Absolute: 2.9 10*3/uL (ref 1.4–7.0)
Neutrophils: 46 %
Platelets: 320 10*3/uL (ref 150–450)
RBC: 5.27 x10E6/uL (ref 4.14–5.80)
RDW: 14.5 % (ref 11.6–15.4)
WBC: 6.3 10*3/uL (ref 3.4–10.8)

## 2020-01-16 LAB — LIPID PANEL
Chol/HDL Ratio: 3.7 ratio (ref 0.0–5.0)
Cholesterol, Total: 148 mg/dL (ref 100–199)
HDL: 40 mg/dL (ref >39)
LDL Chol Calc (NIH): 90 mg/dL (ref 0–99)
Triglycerides: 95 mg/dL (ref 0–149)
VLDL Cholesterol Cal: 18 mg/dL (ref 5–40)

## 2020-01-30 ENCOUNTER — Other Ambulatory Visit: Payer: Self-pay | Admitting: Family Medicine

## 2020-01-30 ENCOUNTER — Other Ambulatory Visit: Payer: Self-pay | Admitting: Nurse Practitioner

## 2020-01-30 ENCOUNTER — Telehealth: Payer: Self-pay

## 2020-01-30 MED ORDER — CITALOPRAM HYDROBROMIDE 20 MG PO TABS
20.0000 mg | ORAL_TABLET | Freq: Every day | ORAL | 1 refills | Status: DC
Start: 2020-01-30 — End: 2020-05-17

## 2020-01-30 MED FILL — CITALOPRAM HBR 20 MG TABLET: 20 | 90 days supply | Qty: 90 | Fill #0

## 2020-01-30 MED FILL — CITALOPRAM HBR 20 MG TABLET: 20 | 90 days supply | Qty: 90 | Fill #0 | Status: TO

## 2020-01-30 NOTE — Telephone Encounter (Signed)
Left detailed message stating Rx has been sent to pharmacy and to call back with any questions or concerns ° °

## 2020-02-12 ENCOUNTER — Ambulatory Visit: Payer: No Typology Code available for payment source | Admitting: Family Medicine

## 2020-02-25 ENCOUNTER — Ambulatory Visit: Payer: No Typology Code available for payment source | Admitting: Neurology

## 2020-03-09 ENCOUNTER — Other Ambulatory Visit: Payer: Self-pay | Admitting: Family Medicine

## 2020-03-09 DIAGNOSIS — I10 Essential (primary) hypertension: Secondary | ICD-10-CM

## 2020-03-10 ENCOUNTER — Other Ambulatory Visit: Payer: Self-pay | Admitting: Nurse Practitioner

## 2020-03-10 MED FILL — AMLODIPINE BESYLATE 10 MG T: 10 | 90 days supply | Qty: 90 | Fill #0

## 2020-04-12 ENCOUNTER — Other Ambulatory Visit: Payer: Self-pay | Admitting: Family Medicine

## 2020-04-12 DIAGNOSIS — I10 Essential (primary) hypertension: Secondary | ICD-10-CM

## 2020-04-13 MED FILL — OLMESARTAN MEDOXOMIL 20 MG: 20 | 90 days supply | Qty: 90 | Fill #0

## 2020-04-20 ENCOUNTER — Ambulatory Visit: Payer: Self-pay | Admitting: Nurse Practitioner

## 2020-04-28 ENCOUNTER — Ambulatory Visit: Payer: No Typology Code available for payment source | Admitting: Neurology

## 2020-05-05 MED FILL — CITALOPRAM HBR 20 MG TABLET: 20 | 90 days supply | Qty: 90 | Fill #0

## 2020-05-07 ENCOUNTER — Other Ambulatory Visit: Payer: Self-pay | Admitting: *Deleted

## 2020-05-07 ENCOUNTER — Other Ambulatory Visit: Payer: Self-pay | Admitting: Family Medicine

## 2020-05-07 MED ORDER — LEVOTHYROXINE SODIUM 88 MCG PO TABS: 88.0000 ug | ORAL_TABLET | Freq: Every day | ORAL | 0 refills | Status: DC

## 2020-05-07 MED FILL — LEVOTHYROXINE SODIUM 88 MCG: 88 | 90 days supply | Qty: 90 | Fill #0

## 2020-05-17 ENCOUNTER — Encounter: Payer: Self-pay | Admitting: Nurse Practitioner

## 2020-05-17 ENCOUNTER — Other Ambulatory Visit: Payer: Self-pay

## 2020-05-17 ENCOUNTER — Ambulatory Visit (INDEPENDENT_AMBULATORY_CARE_PROVIDER_SITE_OTHER): Payer: No Typology Code available for payment source | Admitting: Nurse Practitioner

## 2020-05-17 ENCOUNTER — Other Ambulatory Visit: Payer: Self-pay | Admitting: Nurse Practitioner

## 2020-05-17 VITALS — BP 139/84 | HR 92 | Temp 98.9°F | Resp 20 | Ht 72.0 in | Wt 339.0 lb

## 2020-05-17 DIAGNOSIS — I10 Essential (primary) hypertension: Secondary | ICD-10-CM

## 2020-05-17 DIAGNOSIS — E039 Hypothyroidism, unspecified: Secondary | ICD-10-CM

## 2020-05-17 DIAGNOSIS — E119 Type 2 diabetes mellitus without complications: Secondary | ICD-10-CM | POA: Diagnosis not present

## 2020-05-17 DIAGNOSIS — E1121 Type 2 diabetes mellitus with diabetic nephropathy: Secondary | ICD-10-CM

## 2020-05-17 DIAGNOSIS — E782 Mixed hyperlipidemia: Secondary | ICD-10-CM

## 2020-05-17 DIAGNOSIS — F419 Anxiety disorder, unspecified: Secondary | ICD-10-CM

## 2020-05-17 DIAGNOSIS — J0100 Acute maxillary sinusitis, unspecified: Secondary | ICD-10-CM

## 2020-05-17 DIAGNOSIS — F32A Depression, unspecified: Secondary | ICD-10-CM

## 2020-05-17 DIAGNOSIS — J3089 Other allergic rhinitis: Secondary | ICD-10-CM

## 2020-05-17 DIAGNOSIS — K2100 Gastro-esophageal reflux disease with esophagitis, without bleeding: Secondary | ICD-10-CM

## 2020-05-17 LAB — BAYER DCA HB A1C WAIVED: HB A1C (BAYER DCA - WAIVED): 8.6 % — ABNORMAL HIGH (ref <7.0)

## 2020-05-17 MED ORDER — OLMESARTAN MEDOXOMIL 20 MG PO TABS
20.0000 mg | ORAL_TABLET | Freq: Every day | ORAL | 1 refills | Status: DC
Start: 2020-05-17 — End: 2020-05-17

## 2020-05-17 MED ORDER — HYDROCHLOROTHIAZIDE 25 MG PO TABS: 25.0000 mg | ORAL_TABLET | Freq: Every day | ORAL | 1 refills | Status: DC

## 2020-05-17 MED ORDER — AMLODIPINE BESYLATE 10 MG PO TABS: 10.0000 mg | ORAL_TABLET | Freq: Every day | ORAL | 1 refills | Status: DC

## 2020-05-17 MED ORDER — METFORMIN HCL 500 MG PO TABS: ORAL_TABLET | ORAL | 1 refills | Status: DC

## 2020-05-17 MED ORDER — LEVOTHYROXINE SODIUM 88 MCG PO TABS
88.0000 ug | ORAL_TABLET | Freq: Every day | ORAL | 1 refills | Status: DC
Start: 2020-05-17 — End: 2020-05-17

## 2020-05-17 MED ORDER — CITALOPRAM HYDROBROMIDE 20 MG PO TABS: 20.0000 mg | ORAL_TABLET | Freq: Every day | ORAL | 1 refills | Status: DC

## 2020-05-17 MED ORDER — ROSUVASTATIN CALCIUM 40 MG PO TABS: 40.0000 mg | ORAL_TABLET | Freq: Every day | ORAL | 1 refills | Status: DC

## 2020-05-17 MED ORDER — FLUTICASONE PROPIONATE 50 MCG/ACT NA SUSP
1.0000 | Freq: Two times a day (BID) | NASAL | 6 refills | Status: DC
Start: 2020-05-17 — End: 2020-05-17

## 2020-05-17 NOTE — Progress Notes (Signed)
Subjective:    Patient ID: [name], male    DOB: 07-28-1975, 45 y.o.   MRN: [id number]   Chief Complaint: Medical Management of Chronic Issues    HPI:  1. HTN (hypertension), benign Monitors BP at home periodically. Tolerating HCTZ, amlodipine, and olmesartan. No side effects. Watches sodium intake, does not add additional salt to foods. Walking about once a week.   BP Readings from Last 3 Encounters:  05/17/20 139/84  01/15/20 138/85  11/06/19 139/86    2. Controlled type 2 diabetes mellitus with diabetic nephropathy, without long-term current use of insulin (HCC) No s/s of numbness/tingling on feet. No issues with slow healing wounds.   3. Mixed hyperlipidemia On rousvastain, no side effects. Not following low fat diet at this time.   Lab Results  Component Value Date   CHOL 148 01/15/2020   HDL 40 01/15/2020   LDLCALC 90 01/15/2020   LDLDIRECT 208.6 03/26/2013   TRIG 95 01/15/2020   CHOLHDL 3.7 01/15/2020   The 10-year ASCVD risk score Mikey Bussing DC Jr., et al., 2013) is: 12.2%   Values used to calculate the score:     Age: 55 years     Sex: Male     Is Non-Hispanic African American: Yes     Diabetic: Yes     Tobacco smoker: No     Systolic Blood Pressure: 315 mmHg     Is BP treated: Yes     HDL Cholesterol: 40 mg/dL     Total Cholesterol: 148 mg/dL   4. Type 2 diabetes mellitus without complication, without long-term current use of insulin (HCC) Is not taking blood glucose in the AM. Has not been taking metformin regularly. He admits to being in denial about DM, but is now worried about his increased A1c.   Lab Results  Component Value Date   HGBA1C 6.9 01/15/2020     5. Gastroesophageal reflux disease with esophagitis, unspecified whether hemorrhage Avoiding spicy foods and eating late at night  6. Morbid obesity ([place]) Not really watching weight at this point. He would like to discuss a target weight.   Wt Readings from Last 3 Encounters:   05/17/20 (!) 339 lb (153.8 kg)  01/15/20 (!) 326 lb (147.9 kg)  11/06/19 (!) 328 lb 3.2 oz (148.9 kg)    7. Anxiety and depression On Celexa. Doing well on celexa. He is feeling well lately. No side effects.   PHQ9 SCORE ONLY 05/17/2020 01/15/2020 01/15/2020  PHQ-9 Total Score 0 1 0      Outpatient Encounter Medications as of 05/17/2020  Medication Sig  . amLODipine (NORVASC) 10 MG tablet TAKE 1 TABLET (10 MG TOTAL) BY MOUTH DAILY.  . citalopram (CELEXA) 20 MG tablet Take 1 tablet (20 mg total) by mouth daily.  . fluticasone (FLONASE) 50 MCG/ACT nasal spray Place 1 spray into both nostrils 2 (two) times daily.  . hydrochlorothiazide (HYDRODIURIL) 25 MG tablet Take 1 tablet (25 mg total) by mouth daily.  Marland Kitchen levothyroxine (SYNTHROID) 88 MCG tablet Take 1 tablet (88 mcg total) by mouth daily.  Marland Kitchen loratadine (CLARITIN) 10 MG tablet Take 1 tablet (10 mg total) by mouth daily.  . metFORMIN (GLUCOPHAGE) 500 MG tablet Take 2 tablets (1,000 mg total) by mouth daily with breakfast AND 1 tablet (500 mg total) daily with supper.  . olmesartan (BENICAR) 20 MG tablet TAKE 1 TABLET BY MOUTH ONCE DAILY.  . rosuvastatin (CRESTOR) 40 MG tablet Take 1 tablet (40 mg total) by mouth daily.  Marland Kitchen  aspirin EC 81 MG tablet Take 81 mg by mouth. Only takes occasionally. (Patient not taking: No sig reported)  . [DISCONTINUED] ezetimibe (ZETIA) 10 MG tablet Take 1 tablet (10 mg total) by mouth daily.   No facility-administered encounter medications on file as of 05/17/2020.    Past Surgical History:  Procedure Laterality Date  . FRACTURE SURGERY     right arch  . HERNIA REPAIR  1990   Right ingruial & umbilical [place]   . LEFT HEART CATHETERIZATION WITH CORONARY ANGIOGRAM N/A 09/26/2011   Procedure: LEFT HEART CATHETERIZATION WITH CORONARY ANGIOGRAM;  Surgeon: Laverda Page, MD;  Location: [facility] CATH LAB;  Service: Cardiovascular;  Laterality: N/A;  . Repair Right Arm Fracture  1996   [place]   .  UVULOPALATOPHARYNGOPLASTY (UPPP)/TONSILLECTOMY/SEPTOPLASTY      Family History  Problem Relation Age of Onset  . Hypertension Mother   . Hypertension Father   . Heart attack Father   . Breast cancer Paternal Grandmother   . Diabetes Paternal Grandmother   . Kidney disease Maternal Grandfather   . Down syndrome Brother     New complaints: Sinus pressure, congested ears.  Social history: Lives with wife  Controlled substance contract: N/a     Review of Systems  Constitutional: Negative for chills, fatigue and fever.  HENT: Positive for congestion, sinus pressure and sinus pain. Negative for sore throat and tinnitus.   Eyes: Positive for itching. Negative for pain and redness.  Respiratory: Negative for cough, shortness of breath and wheezing.   Cardiovascular: Negative for chest pain and palpitations.  Gastrointestinal: Negative for constipation, diarrhea and nausea.  Genitourinary: Negative for difficulty urinating and dysuria.  Neurological: Negative for dizziness, syncope, light-headedness and headaches.  Psychiatric/Behavioral: Negative for sleep disturbance. The patient is not nervous/anxious.        Objective:   Physical Exam Constitutional:      Appearance: He is obese.  HENT:     Right Ear: There is impacted cerumen.     Left Ear: There is impacted cerumen.     Nose: Rhinorrhea present.     Right Turbinates: Swollen.     Left Turbinates: Swollen.     Right Sinus: Maxillary sinus tenderness and frontal sinus tenderness present.     Left Sinus: Maxillary sinus tenderness and frontal sinus tenderness present.     Mouth/Throat:     Mouth: Mucous membranes are moist.     Pharynx: Oropharynx is clear.  Eyes:     Extraocular Movements: Extraocular movements intact.  Cardiovascular:     Rate and Rhythm: Normal rate and regular rhythm.     Pulses: Normal pulses.     Heart sounds: Normal heart sounds.  Pulmonary:     Effort: Pulmonary effort is normal.      Breath sounds: Normal breath sounds.  Abdominal:     General: Bowel sounds are normal.     Palpations: Abdomen is soft.  Musculoskeletal:        General: Normal range of motion.     Cervical back: Normal range of motion and neck supple.  Skin:    General: Skin is warm and dry.     Capillary Refill: Capillary refill takes less than 2 seconds.  Neurological:     Mental Status: He is alert and oriented to person, place, and time.  Psychiatric:        Mood and Affect: Mood normal.        Behavior: Behavior normal.  Thought Content: Thought content normal.        Judgment: Judgment normal.    Vitals:   05/17/20 1609  BP: 139/84  Pulse: 92  Resp: 20  Temp: 98.9 F (37.2 C)  SpO2: 97%   A1c today is 8.6%  S/P bil ear irrigation- TM's clear bil    Assessment & Plan:  [name] comes in today with chief complaint of Medical Management of Chronic Issues   Diagnosis and orders addressed:  1. HTN (hypertension), benign Continue medications as prescribed. Continue to watch sodium intake.  - CBC with Differential/Platelet - CMP14+EGFR - olmesartan (BENICAR) 20 MG tablet; Take 1 tablet (20 mg total) by mouth daily.  Dispense: 90 tablet; Refill: 1 - amLODipine (NORVASC) 10 MG tablet; Take 1 tablet (10 mg total) by mouth daily.  Dispense: 90 tablet; Refill: 1  2. Controlled type 2 diabetes mellitus with diabetic nephropathy, without long-term current use of insulin (HCC) No problems with numbness or tingling.   - Bayer DCA Hb A1c Waived - Microalbumin / creatinine urine ratio  3. Mixed hyperlipidemia Continue medication as prescribed. Reduce fat and calorie intake, exercise daily.   - Lipid panel - hydrochlorothiazide (HYDRODIURIL) 25 MG tablet; Take 1 tablet (25 mg total) by mouth daily.  Dispense: 90 tablet; Refill: 1  4. Type 2 diabetes mellitus without complication, without long-term current use of insulin (HCC) Pt's A1c today is elevated. He will begin to  take metformin as prescribed, lower carbohydrate and calorie intake. He was educated on pathophysiology of DM and long term repercussions of uncontrolled blood sugar over a lifetime.   - rosuvastatin (CRESTOR) 40 MG tablet; Take 1 tablet (40 mg total) by mouth daily.  Dispense: 90 tablet; Refill: 1 - metFORMIN (GLUCOPHAGE) 500 MG tablet; Take 2 tablets (1,000 mg total) by mouth daily with breakfast AND 1 tablet (500 mg total) daily with supper.  Dispense: 270 tablet; Refill: 1  5. Gastroesophageal reflux disease with esophagitis, unspecified whether hemorrhage Continue to control with diet  6. Morbid obesity (HCC) Weight loss goal of 8lbs by next visit was agreed upon. He will begin to food diary, increasing daily exercise, decreasing carbohydrate and fat intake.   7. Anxiety and depression Doing well on celexa. Continue as prescribed.  - citalopram (CELEXA) 20 MG tablet; Take 1 tablet (20 mg total) by mouth daily.  Dispense: 90 tablet; Refill: 1  8. Acquired hypothyroidism Continue medication as prescribed.  - levothyroxine (SYNTHROID) 88 MCG tablet; Take 1 tablet (88 mcg total) by mouth daily.  Dispense: 90 tablet; Refill: 1  9. Other allergic rhinitis Begin wearing N95 mask and goggles at work. This may help to decrease irritant. He is often in areas with sanding and debris. Use flonase daily.  - fluticasone (FLONASE) 50 MCG/ACT nasal spray; Place 1 spray into both nostrils 2 (two) times daily.  Dispense: 16 g; Refill: 6  10. Acute non-recurrent maxillary sinusitis 1. Take meds as prescribed 2. Use a cool mist humidifier especially during the winter months and when heat has been humid. 3. Use saline nose sprays frequently 4. Saline irrigations of the nose can be very helpful if done frequently.  * 4X daily for 1 week*  * Use of a nettie pot can be helpful with this. Follow directions with this* 5. Drink plenty of fluids 6. Keep thermostat turn down low 7.For any cough or  congestion  Use plain Mucinex- regular strength or max strength is fine   * Children- consult with  Pharmacist for dosing 8. For fever or aces or pains- take tylenol or ibuprofen appropriate for age and weight.  * for fevers greater than 101 orally you may alternate ibuprofen and tylenol every  3 hours.    - fluticasone (FLONASE) 50 MCG/ACT nasal spray; Place 1 spray into both nostrils 2 (two) times daily.  Dispense: 16 g; Refill: 6  11. bil cerumen impaction [place] 2-3 x a week  Labs pending Health Maintenance reviewed Diet and exercise encouraged  Follow up plan: 30 days  Dollene Primrose, RN, BSN, FNP-Student  Mary-Margaret Hassell Done, FNP

## 2020-05-17 NOTE — Patient Instructions (Signed)

## 2020-05-18 LAB — CBC WITH DIFFERENTIAL/PLATELET
Basophils Absolute: 0.1 10*3/uL (ref 0.0–0.2)
Basos: 1 %
EOS (ABSOLUTE): 0.2 10*3/uL (ref 0.0–0.4)
Eos: 3 %
Hematocrit: 42.5 % (ref 37.5–51.0)
Hemoglobin: 14.1 g/dL (ref 13.0–17.7)
Immature Grans (Abs): 0 10*3/uL (ref 0.0–0.1)
Immature Granulocytes: 0 %
Lymphocytes Absolute: 2.7 10*3/uL (ref 0.7–3.1)
Lymphs: 40 %
MCH: 27.2 pg (ref 26.6–33.0)
MCHC: 33.2 g/dL (ref 31.5–35.7)
MCV: 82 fL (ref 79–97)
Monocytes Absolute: 0.9 10*3/uL (ref 0.1–0.9)
Monocytes: 13 %
Neutrophils Absolute: 2.9 10*3/uL (ref 1.4–7.0)
Neutrophils: 43 %
Platelets: 326 10*3/uL (ref 150–450)
RBC: 5.18 x10E6/uL (ref 4.14–5.80)
RDW: 14.7 % (ref 11.6–15.4)
WBC: 6.7 10*3/uL (ref 3.4–10.8)

## 2020-05-18 LAB — LIPID PANEL
Chol/HDL Ratio: 6 ratio — ABNORMAL HIGH (ref 0.0–5.0)
Cholesterol, Total: 260 mg/dL — ABNORMAL HIGH (ref 100–199)
HDL: 43 mg/dL (ref >39)
LDL Chol Calc (NIH): 188 mg/dL — ABNORMAL HIGH (ref 0–99)
Triglycerides: 156 mg/dL — ABNORMAL HIGH (ref 0–149)
VLDL Cholesterol Cal: 29 mg/dL (ref 5–40)

## 2020-05-18 LAB — CMP14+EGFR
ALT: 36 IU/L (ref 0–44)
AST: 20 IU/L (ref 0–40)
Albumin/Globulin Ratio: 1.6 (ref 1.2–2.2)
Albumin: 4.5 g/dL (ref 4.0–5.0)
Alkaline Phosphatase: 56 IU/L (ref 44–121)
BUN/Creatinine Ratio: 14 (ref 9–20)
BUN: 15 mg/dL (ref 6–24)
Bilirubin Total: <0.2 mg/dL (ref 0.0–1.2)
CO2: 25 mmol/L (ref 20–29)
Calcium: 9.7 mg/dL (ref 8.7–10.2)
Chloride: 98 mmol/L (ref 96–106)
Creatinine, Ser: 1.05 mg/dL (ref 0.76–1.27)
Globulin, Total: 2.8 g/dL (ref 1.5–4.5)
Glucose: 138 mg/dL — ABNORMAL HIGH (ref 65–99)
Potassium: 4.2 mmol/L (ref 3.5–5.2)
Sodium: 138 mmol/L (ref 134–144)
Total Protein: 7.3 g/dL (ref 6.0–8.5)
eGFR: 91 mL/min/{1.73_m2} (ref >59)

## 2020-05-18 MED FILL — FLUTICASONE PROP 50 MCG SPR: 50 | 30 days supply | Qty: 16 | Fill #0

## 2020-06-08 ENCOUNTER — Ambulatory Visit: Payer: No Typology Code available for payment source | Admitting: Neurology

## 2020-06-10 MED FILL — AMLODIPINE BESYLATE 10 MG T: 10 | 90 days supply | Qty: 90 | Fill #0

## 2020-06-10 MED FILL — HYDROCHLOROTHIAZIDE 25 MG T: 25 | 90 days supply | Qty: 90 | Fill #0

## 2020-06-18 ENCOUNTER — Other Ambulatory Visit: Payer: Self-pay

## 2020-06-18 ENCOUNTER — Ambulatory Visit (INDEPENDENT_AMBULATORY_CARE_PROVIDER_SITE_OTHER): Payer: No Typology Code available for payment source | Admitting: Nurse Practitioner

## 2020-06-18 ENCOUNTER — Encounter: Payer: Self-pay | Admitting: Nurse Practitioner

## 2020-06-18 VITALS — BP 143/97 | HR 94 | Temp 98.0°F | Resp 20 | Ht 72.0 in | Wt 334.0 lb

## 2020-06-18 DIAGNOSIS — E1121 Type 2 diabetes mellitus with diabetic nephropathy: Secondary | ICD-10-CM

## 2020-06-18 LAB — BAYER DCA HB A1C WAIVED: HB A1C (BAYER DCA - WAIVED): 8.3 % — ABNORMAL HIGH (ref <7.0)

## 2020-06-18 NOTE — Patient Instructions (Signed)

## 2020-06-18 NOTE — Progress Notes (Signed)
   Subjective:    Patient ID: [name], male    DOB: Sep 23, 1975, 45 y.o.   MRN: [id number]   Chief Complaint: diabetes  HPI Patient was seen on 05/17/20 for follow up of chronic medical problems. He was feeling well without complaints. He had not been taking his medications as prescribed for his diabetes. He had only been taking metformin qd instead of BID. His HGBA1c was 8.6%. He was encouraged to watch carbs in diet and take his metformin BID as prescribed. He doe snot like to check his blood sugar so he does not do iit very often even though encouraged to do fasting in mornings. He has still only been taking metformin 1x a day. He has been trying to exercise and has been drinking mainly water and has stopped sodas.  Review of Systems  Constitutional: Negative for diaphoresis.  Eyes: Negative for pain.  Respiratory: Negative for shortness of breath.   Cardiovascular: Negative for chest pain, palpitations and leg swelling.  Gastrointestinal: Negative for abdominal pain.  Endocrine: Negative for polydipsia.  Skin: Negative for rash.  Neurological: Negative for dizziness, weakness and headaches.  Hematological: Does not bruise/bleed easily.  All other systems reviewed and are negative.      Objective:   Physical Exam Vitals and nursing note reviewed.  Constitutional:      Appearance: He is obese.  Cardiovascular:     Rate and Rhythm: Normal rate and regular rhythm.     Heart sounds: Normal heart sounds.  Pulmonary:     Effort: Pulmonary effort is normal.     Breath sounds: Normal breath sounds.  Skin:    General: Skin is warm and dry.  Neurological:     General: No focal deficit present.     Mental Status: He is alert and oriented to person, place, and time.  Psychiatric:        Mood and Affect: Mood normal.        Behavior: Behavior normal.     BP (!) 143/97   Pulse 94   Temp 98 F (36.7 C) (Temporal)   Resp 20   Ht 6' (1.829 m)   Wt (!) 334 lb (151.5 kg)    SpO2 95%   BMI 45.30 kg/m   HGBA1c 8.3%      Assessment & Plan:  [name] in today with chief complaint of Diabetes   1. Controlled type 2 diabetes mellitus with diabetic nephropathy, without long-term current use of insulin (HCC) Take metformin BID instead of daily Continue to exercise Decrease carbs in diet Follow up in 3 months - Bayer DCA Hb A1c Waived    The above assessment and management plan was discussed with the patient. The patient verbalized understanding of and has agreed to the management plan. Patient is aware to call the clinic if symptoms persist or worsen. Patient is aware when to return to the clinic for a follow-up visit. Patient educated on when it is appropriate to go to the emergency department.   Mary-Margaret Daphine Deutscher, FNP

## 2020-07-07 ENCOUNTER — Ambulatory Visit (INDEPENDENT_AMBULATORY_CARE_PROVIDER_SITE_OTHER): Payer: No Typology Code available for payment source | Admitting: Family Medicine

## 2020-07-07 ENCOUNTER — Encounter: Payer: Self-pay | Admitting: Family Medicine

## 2020-07-07 DIAGNOSIS — R8290 Unspecified abnormal findings in urine: Secondary | ICD-10-CM

## 2020-07-07 LAB — URINALYSIS, ROUTINE W REFLEX MICROSCOPIC
Bilirubin, UA: NEGATIVE
Glucose, UA: NEGATIVE
Ketones, UA: NEGATIVE
Leukocytes,UA: NEGATIVE
Nitrite, UA: NEGATIVE
Protein,UA: NEGATIVE
RBC, UA: NEGATIVE
Specific Gravity, UA: 1.025 (ref 1.005–1.030)
Urobilinogen, Ur: 1 mg/dL (ref 0.2–1.0)
pH, UA: 6 (ref 5.0–7.5)

## 2020-07-07 NOTE — Progress Notes (Signed)
Virtual Visit via Telephone Note  I connected with [name] on 07/07/20 at 4:38 PM by telephone and verified that I am speaking with the correct person using two identifiers. [name] is currently located in his vehicle and nobody is currently with him during this visit. The provider, Gwenlyn Fudge, FNP is located in their office at time of visit.  I discussed the limitations, risks, security and privacy concerns of performing an evaluation and management service by telephone and the availability of in person appointments. I also discussed with the patient that there may be a patient responsible charge related to this service. The patient expressed understanding and agreed to proceed.  Subjective: PCP: Gwenlyn Fudge, FNP  Chief Complaint  Patient presents with  . Urinary Tract Infection   Patient reports his urine smells strong and wants to be sure he doesn't have an infection. He is only drinking 4 cups of water and 1 cup of coffee per day.   ROS: Per HPI  Current Outpatient Medications:  .  amLODipine (NORVASC) 10 MG tablet, TAKE 1 TABLET BY MOUTH ONCE DAILY, Disp: 90 tablet, Rfl: 1 .  aspirin EC 81 MG tablet, Take 81 mg by mouth. Only takes occasionally., Disp: , Rfl:  .  citalopram (CELEXA) 20 MG tablet, TAKE 1 TABLET BY MOUTH ONCE DAILY, Disp: 90 tablet, Rfl: 1 .  fluticasone (FLONASE) 50 MCG/ACT nasal spray, PLACE 1 SPRAY INTO BOTH NOSTRILS 2 TIMES DAILY, Disp: 16 g, Rfl: 6 .  hydrochlorothiazide (HYDRODIURIL) 25 MG tablet, TAKE 1 TABLET BY MOUTH ONCE DAILY, Disp: 90 tablet, Rfl: 1 .  levothyroxine (SYNTHROID) 88 MCG tablet, TAKE 1 TABLET BY MOUTH ONCE DAILY, Disp: 90 tablet, Rfl: 1 .  loratadine (CLARITIN) 10 MG tablet, Take 1 tablet (10 mg total) by mouth daily., Disp: , Rfl:  .  metFORMIN (GLUCOPHAGE) 500 MG tablet, TAKE 2 TABLETS BY MOUTH DAILY WITH BREAKFAST AND 1 TABLET DAILY WITH SUPPER, Disp: 270 tablet, Rfl: 1 .  olmesartan (BENICAR) 20 MG tablet, TAKE  1 TABLET BY MOUTH ONCE DAILY, Disp: 90 tablet, Rfl: 1 .  rosuvastatin (CRESTOR) 40 MG tablet, TAKE 1 TABLET BY MOUTH ONCE DAILY, Disp: 90 tablet, Rfl: 1  Allergies  Allergen Reactions  . Atorvastatin Other (See Comments)    Elevated liver enzymes  . Lisinopril Cough  . Cymbalta [Duloxetine Hcl] Rash   Past Medical History:  Diagnosis Date  . Anxiety   . Depression   . Diabetes mellitus without complication (HCC)   . Hyperlipidemia   . Hypertension   . Hypothyroidism   . Obesity   . OSA (obstructive sleep apnea)     Observations/Objective: A&O  No respiratory distress or wheezing audible over the phone Mood, judgement, and thought processes all WNL   Assessment and Plan: 1. Abnormal urine odor Reassured he does not have a UTI. Encouraged to drink 8-8 oz glasses of water per day.  - Urinalysis, Routine w reflex microscopic   Follow Up Instructions:  I discussed the assessment and treatment plan with the patient. The patient was provided an opportunity to ask questions and all were answered. The patient agreed with the plan and demonstrated an understanding of the instructions.   The patient was advised to call back or seek an in-person evaluation if the symptoms worsen or if the condition fails to improve as anticipated.  The above assessment and management plan was discussed with the patient. The patient verbalized understanding of and has agreed  to the management plan. Patient is aware to call the clinic if symptoms persist or worsen. Patient is aware when to return to the clinic for a follow-up visit. Patient educated on when it is appropriate to go to the emergency department.   Time call ended: 4:43 PM  I provided 5 minutes of non-face-to-face time during this encounter.  Deliah Boston, MSN, APRN, FNP-C [facility] 07/07/20

## 2020-07-19 ENCOUNTER — Other Ambulatory Visit (HOSPITAL_BASED_OUTPATIENT_CLINIC_OR_DEPARTMENT_OTHER): Payer: Self-pay

## 2020-07-19 MED FILL — Olmesartan Medoxomil Tab 20 MG: ORAL | 90 days supply | Qty: 90 | Fill #0 | Status: AC

## 2020-08-06 ENCOUNTER — Other Ambulatory Visit (HOSPITAL_BASED_OUTPATIENT_CLINIC_OR_DEPARTMENT_OTHER): Payer: Self-pay

## 2020-08-06 MED FILL — Citalopram Hydrobromide Tab 20 MG (Base Equiv): ORAL | 90 days supply | Qty: 90 | Fill #0 | Status: AC

## 2020-08-06 MED FILL — Rosuvastatin Calcium Tab 40 MG: ORAL | 90 days supply | Qty: 90 | Fill #0 | Status: AC

## 2020-08-26 ENCOUNTER — Encounter: Payer: Self-pay | Admitting: Nurse Practitioner

## 2020-08-26 ENCOUNTER — Other Ambulatory Visit (HOSPITAL_BASED_OUTPATIENT_CLINIC_OR_DEPARTMENT_OTHER): Payer: Self-pay

## 2020-08-26 ENCOUNTER — Ambulatory Visit (INDEPENDENT_AMBULATORY_CARE_PROVIDER_SITE_OTHER): Payer: No Typology Code available for payment source | Admitting: Nurse Practitioner

## 2020-08-26 DIAGNOSIS — J069 Acute upper respiratory infection, unspecified: Secondary | ICD-10-CM | POA: Diagnosis not present

## 2020-08-26 MED ORDER — AMOXICILLIN 875 MG PO TABS
875.0000 mg | ORAL_TABLET | Freq: Two times a day (BID) | ORAL | 0 refills | Status: DC
  Filled 2020-08-26: qty 20, 10d supply, fill #0

## 2020-08-26 NOTE — Progress Notes (Signed)
Virtual Visit  Note Due to COVID-19 pandemic this visit was conducted virtually. This visit type was conducted due to national recommendations for restrictions regarding the COVID-19 Pandemic (e.g. social distancing, sheltering in place) in an effort to limit this patient's exposure and mitigate transmission in our community. All issues noted in this document were discussed and addressed.  A physical exam was not performed with this format.  I connected with [name] on 08/26/20 at 3:52 by telephone and verified that I am speaking with the correct person using two identifiers. [name] is currently located at home and no one is currently with him during visit. The provider, Mary-Margaret Daphine Deutscher, FNP is located in their office at time of visit.  I discussed the limitations, risks, security and privacy concerns of performing an evaluation and management service by telephone and the availability of in person appointments. I also discussed with the patient that there may be a patient responsible charge related to this service. The patient expressed understanding and agreed to proceed.   History and Present Illness:    Chief Complaint: congestion  HPI Patient calls in c/o nasal and head congestion. He has had for over a week. He has been taking flonase and zyrtec with no relief. He has had all the covid vaccines. He denies being around anyone who is sick.  Review of Systems  Constitutional:  Negative for chills and fever.  HENT:  Positive for congestion. Negative for ear discharge, ear pain, sinus pain and sore throat.   Respiratory:  Negative for cough.   Musculoskeletal:  Positive for myalgias (slight).  Neurological:  Negative for dizziness and headaches.    Observations/Objective: Alert and oriented- answers all questions appropriately No distress Hoarse No cough noted  Assessment and Plan: [name] in today with chief complaint of No chief complaint on  file.   1. URI, acute 1. Take meds as prescribed 2. Use a cool mist humidifier especially during the winter months and when heat has been humid. 3. Use saline nose sprays frequently 4. Saline irrigations of the nose can be very helpful if done frequently.  * 4X daily for 1 week*  * Use of a nettie pot can be helpful with this. Follow directions with this* 5. Drink plenty of fluids 6. Keep thermostat turn down low 7.For any cough or congestion  Use plain Mucinex- regular strength or max strength is fine   * Children- consult with Pharmacist for dosing 8. For fever or aces or pains- take tylenol or ibuprofen appropriate for age and weight.  * for fevers greater than 101 orally you may alternate ibuprofen and tylenol every  3 hours.   Meds ordered this encounter  Medications   amoxicillin (AMOXIL) 875 MG tablet    Sig: Take 1 tablet (875 mg total) by mouth 2 (two) times daily. 1 po BID    Dispense:  20 tablet    Refill:  0    Order Specific Question:   Supervising Provider    Answer:   Arville Care A [[id number]]      Follow Up Instructions: prn    I discussed the assessment and treatment plan with the patient. The patient was provided an opportunity to ask questions and all were answered. The patient agreed with the plan and demonstrated an understanding of the instructions.   The patient was advised to call back or seek an in-person evaluation if the symptoms worsen or if the condition fails to improve as  anticipated.  The above assessment and management plan was discussed with the patient. The patient verbalized understanding of and has agreed to the management plan. Patient is aware to call the clinic if symptoms persist or worsen. Patient is aware when to return to the clinic for a follow-up visit. Patient educated on when it is appropriate to go to the emergency department.   Time call ended:  4:05  I provided 12 minutes of  non face-to-face time during this  encounter.    Mary-Margaret Daphine Deutscher, FNP

## 2020-09-15 ENCOUNTER — Other Ambulatory Visit (HOSPITAL_BASED_OUTPATIENT_CLINIC_OR_DEPARTMENT_OTHER): Payer: Self-pay

## 2020-09-15 MED FILL — Amlodipine Besylate Tab 10 MG (Base Equivalent): ORAL | 90 days supply | Qty: 90 | Fill #0 | Status: AC

## 2020-09-15 MED FILL — Rosuvastatin Calcium Tab 40 MG: ORAL | 90 days supply | Qty: 90 | Fill #1 | Status: CN

## 2020-09-16 ENCOUNTER — Ambulatory Visit: Payer: Self-pay | Admitting: Nurse Practitioner

## 2020-09-28 ENCOUNTER — Encounter: Payer: Self-pay | Admitting: Family Medicine

## 2020-09-28 ENCOUNTER — Other Ambulatory Visit (HOSPITAL_BASED_OUTPATIENT_CLINIC_OR_DEPARTMENT_OTHER): Payer: Self-pay

## 2020-09-28 ENCOUNTER — Ambulatory Visit (INDEPENDENT_AMBULATORY_CARE_PROVIDER_SITE_OTHER): Payer: No Typology Code available for payment source | Admitting: Family Medicine

## 2020-09-28 ENCOUNTER — Other Ambulatory Visit: Payer: Self-pay

## 2020-09-28 ENCOUNTER — Other Ambulatory Visit (HOSPITAL_COMMUNITY): Payer: Self-pay

## 2020-09-28 VITALS — BP 130/78 | HR 83 | Temp 98.2°F | Ht 72.0 in | Wt 339.4 lb

## 2020-09-28 DIAGNOSIS — R0981 Nasal congestion: Secondary | ICD-10-CM

## 2020-09-28 DIAGNOSIS — Z0000 Encounter for general adult medical examination without abnormal findings: Secondary | ICD-10-CM | POA: Diagnosis not present

## 2020-09-28 DIAGNOSIS — E039 Hypothyroidism, unspecified: Secondary | ICD-10-CM | POA: Diagnosis not present

## 2020-09-28 DIAGNOSIS — E1165 Type 2 diabetes mellitus with hyperglycemia: Secondary | ICD-10-CM | POA: Diagnosis not present

## 2020-09-28 DIAGNOSIS — Z1159 Encounter for screening for other viral diseases: Secondary | ICD-10-CM | POA: Diagnosis not present

## 2020-09-28 MED ORDER — LEVOCETIRIZINE DIHYDROCHLORIDE 5 MG PO TABS
5.0000 mg | ORAL_TABLET | Freq: Every evening | ORAL | 2 refills | Status: AC
  Filled 2020-09-28 (×2): qty 30, 30d supply, fill #0

## 2020-09-28 MED ORDER — OZEMPIC (0.25 OR 0.5 MG/DOSE) 2 MG/1.5ML ~~LOC~~ SOPN
PEN_INJECTOR | SUBCUTANEOUS | 1 refills | Status: DC
  Filled 2020-09-28: qty 4.5, 84d supply, fill #0
  Filled 2020-09-28: qty 1.5, 42d supply, fill #0

## 2020-09-28 NOTE — Patient Instructions (Addendum)
Give [place] 2-3 business days to get the results of your labs back.   Keep the diet clean and stay active.  Aim to do some physical exertion for 150 minutes per week. This is typically divided into 5 days per week, 30 minutes per day. The activity should be enough to get your heart rate up. Anything is better than nothing if you have time constraints.  Let me know if the medicine is too expensive.  Try the nasal spray with the new medicine. If still bothersome in a month, let me know on MyChart.   Let us know if you need anything.

## 2020-09-28 NOTE — Progress Notes (Signed)
Chief Complaint  Patient presents with   New Patient (Initial Visit)    Well Male [name] is here for a complete physical.   His last physical was >1 year ago.  Current diet: in general, diet is improving.   Current exercise: none Weight trend: has gained Fatigue out of ordinary? No. Seat belt? Yes.    Health maintenance Tetanus- Yes HIV- Yes Hep C- No  Diabetes The patient has a history of diabetes.  He takes metformin 1000 mg twice daily.  He reports compliance and no adverse effects.  He does not require insulin.  Diet/exercise as above.  His last A1c in April of this year was 8.3.  Obesity The patient is struggled with weight gain over the past 2 years since the pandemic started.  He has been snacking more and eating unhealthy foods.  He is not currently exercising.  Nasal congestion The patient has a history of a deviated septum requiring nasal plasty.  Since that time, he has had chronic left sided congestion.  He feels it is constant.  He uses Flonase and has been doing so for many months.  He does not take any oral antihistamines.  Past Medical History:  Diagnosis Date   Anxiety    Depression    Diabetes mellitus without complication (HCC)    Hyperlipidemia    Hypertension    Hypothyroidism    Obesity    OSA (obstructive sleep apnea)      Past Surgical History:  Procedure Laterality Date   FRACTURE SURGERY     right arch   HERNIA REPAIR  1990   Right ingruial & umbilical [place]    LEFT HEART CATHETERIZATION WITH CORONARY ANGIOGRAM N/A 09/26/2011   Procedure: LEFT HEART CATHETERIZATION WITH CORONARY ANGIOGRAM;  Surgeon: Pamella Pert, MD;  Location: [facility] CATH LAB;  Service: Cardiovascular;  Laterality: N/A;   Repair Right Arm Fracture  1996   [place]    UVULOPALATOPHARYNGOPLASTY (UPPP)/TONSILLECTOMY/SEPTOPLASTY      Medications  Current Outpatient Medications on File Prior to Visit  Medication Sig Dispense Refill   amLODipine (NORVASC) 10  MG tablet TAKE 1 TABLET BY MOUTH ONCE DAILY 90 tablet 1   aspirin EC 81 MG tablet Take 81 mg by mouth. Only takes occasionally.     citalopram (CELEXA) 20 MG tablet TAKE 1 TABLET BY MOUTH ONCE DAILY 90 tablet 1   fluticasone (FLONASE) 50 MCG/ACT nasal spray PLACE 1 SPRAY INTO BOTH NOSTRILS 2 TIMES DAILY 16 g 6   hydrochlorothiazide (HYDRODIURIL) 25 MG tablet TAKE 1 TABLET BY MOUTH ONCE DAILY 90 tablet 1   levothyroxine (SYNTHROID) 88 MCG tablet TAKE 1 TABLET BY MOUTH ONCE DAILY 90 tablet 1   loratadine (CLARITIN) 10 MG tablet Take 1 tablet (10 mg total) by mouth daily.     metFORMIN (GLUCOPHAGE) 500 MG tablet TAKE 2 TABLETS BY MOUTH DAILY WITH BREAKFAST AND 1 TABLET DAILY WITH SUPPER 270 tablet 1   olmesartan (BENICAR) 20 MG tablet TAKE 1 TABLET BY MOUTH ONCE DAILY 90 tablet 1   rosuvastatin (CRESTOR) 40 MG tablet TAKE 1 TABLET BY MOUTH ONCE DAILY 90 tablet 1   Allergies Allergies  Allergen Reactions   Atorvastatin Other (See Comments)    Elevated liver enzymes   Lisinopril Cough   Cymbalta [Duloxetine Hcl] Rash    Family History Family History  Problem Relation Age of Onset   Hypertension Mother    Hypertension Father    Heart attack Father    Breast  cancer Paternal Grandmother    Diabetes Paternal Grandmother    Kidney disease Maternal Grandfather    Down syndrome Brother     Review of Systems: Constitutional: no fevers or chills Eye:  no recent significant change in vision Ear/Nose/Mouth/Throat:  Ears:  no hearing loss Nose/Mouth/Throat:  +L sided nasal congestion, no sore throat Cardiovascular:  no chest pain Respiratory:  no shortness of breath Gastrointestinal:  no abdominal pain, no change in bowel habits GU:  Male: negative for dysuria, frequency, and incontinence Musculoskeletal/Extremities:  no pain of the joints Integumentary (Skin/Breast):  no abnormal skin lesions reported Neurologic:  no headaches Endocrine: + Weight gain Hematologic/Lymphatic:  no night  sweats  Exam BP 130/78   Pulse 83   Temp 98.2 F (36.8 C) (Oral)   Ht 6' (1.829 m)   Wt (!) 339 lb 6 oz (153.9 kg)   SpO2 95%   BMI 46.03 kg/m  General:  well developed, well nourished, in no apparent distress Skin:  no significant moles, warts, or growths Head:  no masses, lesions, or tenderness Eyes:  pupils equal and round, sclera anicteric without injection Ears:  canals without lesions, TMs shiny without retraction, no obvious effusion, no erythema Nose:  nares patent, septum deviated to the left, mucosa normal Throat/Pharynx:  lips and gingiva without lesion; tongue and uvula midline; non-inflamed pharynx; no exudates or postnasal drainage Neck: neck supple without adenopathy, thyromegaly, or masses Lungs:  clear to auscultation, breath sounds equal bilaterally, no respiratory distress Cardio:  regular rate and rhythm, no bruits, no LE edema Abdomen:  abdomen soft, nontender; bowel sounds normal; no masses or organomegaly Rectal: Deferred Musculoskeletal:  symmetrical muscle groups noted without atrophy or deformity Extremities:  no clubbing, cyanosis, or edema, no deformities, no skin discoloration Neuro:  gait normal; deep tendon reflexes normal and symmetric Psych: well oriented with normal range of affect and appropriate judgment/insight  Assessment and Plan  Well adult exam  Morbid obesity (HCC)  Type 2 diabetes mellitus with hyperglycemia, without long-term current use of insulin (HCC) - Plan: Hemoglobin A1c, Comprehensive metabolic panel, CBC, Semaglutide,0.25 or 0.5MG /DOS, (OZEMPIC, 0.25 OR 0.5 MG/DOSE,) 2 MG/1.5ML SOPN, Lipid panel  Encounter for hepatitis C screening test for low risk patient - Plan: Hepatitis C antibody  Acquired hypothyroidism - Plan: TSH  Nasal congestion   Well 45 y.o. male. Counseled on diet and exercise. Patient has been screened for prostate cancer less than 1 year ago.  Was within normal limits so we will hold off today.  I  discussed this with him and we will offer it again at his next physical. Diabetes: Chronic, uncontrolled.  Add Ozempic as above.  We will start with 0.25 mg weekly for 4 doses and then increase to 0.5 mg weekly.  Continue metformin 1000 mg twice daily.  Needs to start exercising. Obesity: I think he will do well with Ozempic.  He will let me know if it is too expensive. Nasal congestion: Continue intranasal corticosteroid, add Xyzal.  If no improvement in the next month, he will send me a message and I will refer him to ENT. Other orders as above. Follow up in 3 mo pending the above workup. The patient voiced understanding and agreement to the plan.  Jilda Roche [place], DO 09/28/20 4:14 PM

## 2020-09-29 LAB — CBC
HCT: 42.5 % (ref 39.0–52.0)
Hemoglobin: 14.1 g/dL (ref 13.0–17.0)
MCHC: 33.2 g/dL (ref 30.0–36.0)
MCV: 81.4 fl (ref 78.0–100.0)
Platelets: 310 10*3/uL (ref 150.0–400.0)
RBC: 5.22 Mil/uL (ref 4.22–5.81)
RDW: 15 % (ref 11.5–15.5)
WBC: 5.9 10*3/uL (ref 4.0–10.5)

## 2020-09-29 LAB — LIPID PANEL
Cholesterol: 213 mg/dL — ABNORMAL HIGH (ref 0–200)
HDL: 42.1 mg/dL (ref >39.00)
LDL Cholesterol: 150 mg/dL — ABNORMAL HIGH (ref 0–99)
NonHDL: 171.31
Total CHOL/HDL Ratio: 5
Triglycerides: 107 mg/dL (ref 0.0–149.0)
VLDL: 21.4 mg/dL (ref 0.0–40.0)

## 2020-09-29 LAB — COMPREHENSIVE METABOLIC PANEL
ALT: 34 U/L (ref 0–53)
AST: 24 U/L (ref 0–37)
Albumin: 4.5 g/dL (ref 3.5–5.2)
Alkaline Phosphatase: 51 U/L (ref 39–117)
BUN: 17 mg/dL (ref 6–23)
CO2: 31 mEq/L (ref 19–32)
Calcium: 9.7 mg/dL (ref 8.4–10.5)
Chloride: 97 mEq/L (ref 96–112)
Creatinine, Ser: 0.98 mg/dL (ref 0.40–1.50)
GFR: 94.89 mL/min (ref >60.00)
Glucose, Bld: 119 mg/dL — ABNORMAL HIGH (ref 70–99)
Potassium: 4.1 mEq/L (ref 3.5–5.1)
Sodium: 136 mEq/L (ref 135–145)
Total Bilirubin: 0.4 mg/dL (ref 0.2–1.2)
Total Protein: 7.6 g/dL (ref 6.0–8.3)

## 2020-09-29 LAB — HEMOGLOBIN A1C: Hgb A1c MFr Bld: 9.1 % — ABNORMAL HIGH (ref 4.6–6.5)

## 2020-09-29 LAB — HEPATITIS C ANTIBODY
Hepatitis C Ab: NONREACTIVE
SIGNAL TO CUT-OFF: 0.03 (ref <1.00)

## 2020-09-29 LAB — TSH: TSH: 3.05 u[IU]/mL (ref 0.35–5.50)

## 2020-10-26 ENCOUNTER — Other Ambulatory Visit (HOSPITAL_BASED_OUTPATIENT_CLINIC_OR_DEPARTMENT_OTHER): Payer: Self-pay

## 2020-10-26 MED FILL — Olmesartan Medoxomil Tab 20 MG: ORAL | 90 days supply | Qty: 90 | Fill #1 | Status: AC

## 2020-10-26 MED FILL — Hydrochlorothiazide Tab 25 MG: ORAL | 90 days supply | Qty: 90 | Fill #0 | Status: AC

## 2020-10-27 ENCOUNTER — Other Ambulatory Visit: Payer: Self-pay

## 2020-10-27 ENCOUNTER — Encounter: Payer: Self-pay | Admitting: Family Medicine

## 2020-10-27 ENCOUNTER — Other Ambulatory Visit (HOSPITAL_BASED_OUTPATIENT_CLINIC_OR_DEPARTMENT_OTHER): Payer: Self-pay

## 2020-10-27 ENCOUNTER — Ambulatory Visit (INDEPENDENT_AMBULATORY_CARE_PROVIDER_SITE_OTHER): Payer: No Typology Code available for payment source | Admitting: Family Medicine

## 2020-10-27 VITALS — BP 136/82 | HR 90 | Temp 98.6°F | Ht 72.0 in | Wt 329.2 lb

## 2020-10-27 DIAGNOSIS — M5442 Lumbago with sciatica, left side: Secondary | ICD-10-CM

## 2020-10-27 MED ORDER — METHOCARBAMOL 500 MG PO TABS
500.0000 mg | ORAL_TABLET | Freq: Three times a day (TID) | ORAL | 0 refills | Status: DC | PRN
  Filled 2020-10-27: qty 30, 10d supply, fill #0

## 2020-10-27 MED ORDER — MELOXICAM 15 MG PO TABS
15.0000 mg | ORAL_TABLET | Freq: Every day | ORAL | 0 refills | Status: DC
  Filled 2020-10-27: qty 30, 30d supply, fill #0

## 2020-10-27 NOTE — Progress Notes (Signed)
Musculoskeletal Exam  Patient: [name] DOB: 12/11/1975  DOS: 10/27/2020  SUBJECTIVE:  Chief Complaint:   Chief Complaint  Patient presents with   Sciatica    Left side of back pain and runs down his leg    KINCAID TIGER is a 45 y.o.  male for evaluation and treatment of L low back pain.   Onset:  2 weeks ago.  No inj or change in activity. Did wear some boots that seemed to have caused back pain past.  Location: lower L back radiating down back of leg Character:  aching and burning  Progression of issue: no change Associated symptoms: none Denies bruising, redness, swelling,  Denies bowel/bladder incontinence or weakness Treatment: to date has been OTC NSAIDS and home exercises.   Neurovascular symptoms: no  Past Medical History:  Diagnosis Date   Anxiety    Depression    Diabetes mellitus without complication (HCC)    Hyperlipidemia    Hypertension    Hypothyroidism    Obesity    OSA (obstructive sleep apnea)     Objective:  VITAL SIGNS: BP 136/82   Pulse 90   Temp 98.6 F (37 C) (Oral)   Ht 6' (1.829 m)   Wt (!) 329 lb 4 oz (149.3 kg)   SpO2 97%   BMI 44.65 kg/m  Constitutional: Well formed, well developed. No acute distress. HENT: Normocephalic, atraumatic.  Thorax & Lungs:  No accessory muscle use Musculoskeletal: low back.   Tenderness to palpation: yes; over L parasp lumbar msc Deformity: no Ecchymosis: no Straight leg test: negative for Poor hamstring flexibility b/l. Neurologic: Normal sensory function. No focal deficits noted. DTR's equal and symmetric in LE's. No clonus. Psychiatric: Normal mood. Age appropriate judgment and insight. Alert & oriented x 3.    Assessment:  Acute left-sided low back pain with left-sided sciatica - Plan: Ambulatory referral to Chiropractic, methocarbamol (ROBAXIN) 500 MG tablet, meloxicam (MOBIC) 15 MG tablet  Plan: Stretches/exercises, heat, ice, Tylenol, NSAIDs. Refer to chiropractor at request.   F/u prn. The patient voiced understanding and agreement to the plan.   Jilda Roche [place], DO 10/27/20  2:00 PM

## 2020-10-27 NOTE — Patient Instructions (Addendum)

## 2020-11-12 ENCOUNTER — Ambulatory Visit: Payer: No Typology Code available for payment source | Admitting: Family Medicine

## 2020-11-16 ENCOUNTER — Other Ambulatory Visit (HOSPITAL_BASED_OUTPATIENT_CLINIC_OR_DEPARTMENT_OTHER): Payer: Self-pay

## 2020-11-16 MED FILL — Citalopram Hydrobromide Tab 20 MG (Base Equiv): ORAL | 90 days supply | Qty: 90 | Fill #1 | Status: AC

## 2020-12-21 ENCOUNTER — Other Ambulatory Visit: Payer: Self-pay | Admitting: Nurse Practitioner

## 2020-12-21 ENCOUNTER — Other Ambulatory Visit (HOSPITAL_BASED_OUTPATIENT_CLINIC_OR_DEPARTMENT_OTHER): Payer: Self-pay

## 2020-12-21 ENCOUNTER — Other Ambulatory Visit: Payer: Self-pay | Admitting: Family Medicine

## 2020-12-21 DIAGNOSIS — I10 Essential (primary) hypertension: Secondary | ICD-10-CM

## 2020-12-21 MED ORDER — AMLODIPINE BESYLATE 10 MG PO TABS
ORAL_TABLET | Freq: Every day | ORAL | 1 refills | Status: DC
  Filled 2020-12-21: qty 90, 90d supply, fill #0
  Filled 2021-03-29: qty 90, 90d supply, fill #1

## 2020-12-21 MED FILL — Metformin HCl Tab 500 MG: ORAL | 90 days supply | Qty: 270 | Fill #0 | Status: AC

## 2020-12-29 ENCOUNTER — Encounter: Payer: Self-pay | Admitting: Family Medicine

## 2020-12-29 ENCOUNTER — Ambulatory Visit (INDEPENDENT_AMBULATORY_CARE_PROVIDER_SITE_OTHER): Payer: No Typology Code available for payment source | Admitting: Family Medicine

## 2020-12-29 ENCOUNTER — Other Ambulatory Visit (HOSPITAL_BASED_OUTPATIENT_CLINIC_OR_DEPARTMENT_OTHER): Payer: Self-pay

## 2020-12-29 ENCOUNTER — Other Ambulatory Visit: Payer: Self-pay

## 2020-12-29 VITALS — BP 150/90 | HR 89 | Temp 99.1°F | Ht 72.0 in | Wt 331.0 lb

## 2020-12-29 DIAGNOSIS — I10 Essential (primary) hypertension: Secondary | ICD-10-CM | POA: Diagnosis not present

## 2020-12-29 DIAGNOSIS — Z23 Encounter for immunization: Secondary | ICD-10-CM | POA: Diagnosis not present

## 2020-12-29 DIAGNOSIS — E119 Type 2 diabetes mellitus without complications: Secondary | ICD-10-CM | POA: Diagnosis not present

## 2020-12-29 DIAGNOSIS — E1165 Type 2 diabetes mellitus with hyperglycemia: Secondary | ICD-10-CM | POA: Diagnosis not present

## 2020-12-29 MED ORDER — METFORMIN HCL ER 500 MG PO TB24
500.0000 mg | ORAL_TABLET | Freq: Every day | ORAL | 2 refills | Status: DC
  Filled 2020-12-29: qty 90, 90d supply, fill #0

## 2020-12-29 MED ORDER — OLMESARTAN MEDOXOMIL 40 MG PO TABS
40.0000 mg | ORAL_TABLET | Freq: Every day | ORAL | 2 refills | Status: DC
  Filled 2020-12-29: qty 30, 30d supply, fill #0
  Filled 2021-01-19: qty 90, 90d supply, fill #0

## 2020-12-29 MED ORDER — METFORMIN HCL 500 MG PO TABS
1000.0000 mg | ORAL_TABLET | Freq: Two times a day (BID) | ORAL | 1 refills | Status: DC
  Filled 2020-12-29: qty 360, 90d supply, fill #0

## 2020-12-29 NOTE — Addendum Note (Signed)
Addended by: Rosita Kea on: 12/29/2020 04:52 PM   Modules accepted: Orders

## 2020-12-29 NOTE — Progress Notes (Signed)
Subjective:  CC: DM visit  [name] is a 45 y.o. male here for follow-up of diabetes.   [name] does not  Patient does not require insulin.   Medications include: metformin 1000 mg in am and 500 mg in PM Diet is OK.  Exercise: active at work  Hypertension Patient presents for hypertension follow up. He does monitor home blood pressures. Blood pressures ranging on average from 140's/80's. He is compliant with medications- Norvasc 10 mg/d, HCTZ 25 mg/d, olmesartan 20 mg/d. Patient has these side effects of medication: none No Cp or SOB.   Past Medical History:  Diagnosis Date   Anxiety    Depression    Diabetes mellitus without complication (HCC)    Hyperlipidemia    Hypertension    Hypothyroidism    Obesity    OSA (obstructive sleep apnea)      Related testing: Retinal exam: Done Pneumovax: done  Objective:  BP (!) 150/90   Pulse 89   Temp 99.1 F (37.3 C) (Oral)   Ht 6' (1.829 m)   Wt (!) 331 lb (150.1 kg)   SpO2 97%   BMI 44.89 kg/m  General:  Well developed, well nourished, in no apparent distress Skin:  Warm, no pallor or diaphoresis Lungs:  CTAB, no access msc use Cardio:  RRR, no bruits, 1+ b/l pitting LE edema Psych: Age appropriate judgment and insight  Assessment:   Type 2 diabetes mellitus with hyperglycemia, without long-term current use of insulin (HCC) - Plan: Hemoglobin A1c  Essential hypertension  Type 2 diabetes mellitus without complication, without long-term current use of insulin (HCC) - Plan: DISCONTINUED: metFORMIN (GLUCOPHAGE) 500 MG tablet  Need for influenza vaccination - Plan: Flu Vaccine QUAD 6+ mos PF IM (Fluarix Quad PF)   Plan:   Chronic, unstable. Cont metformin 1000 mg in AM, will increase to 1000 mg in PM also. He will start Ozempic after our discussion clarifying details about it. Counseled on diet and exercise. Chronic, unstable. Cont Norvasc 10 mg/d, HCTZ 25 mg/d. Increase olmesartan from 20 mg/d to 40 mg/d.  F/u  in 3-6 mo pending above. The patient voiced understanding and agreement to the plan.  Jilda Roche [place], DO 12/29/20 4:34 PM

## 2020-12-29 NOTE — Patient Instructions (Signed)
Keep the diet clean and stay active.  Give [place] 2-3 business days to get the results of your labs back.   Take metformin 1000 mg twice daily. Start the Ozempic.  Let us know if you need anything.

## 2021-01-04 ENCOUNTER — Other Ambulatory Visit: Payer: No Typology Code available for payment source

## 2021-01-04 ENCOUNTER — Other Ambulatory Visit (INDEPENDENT_AMBULATORY_CARE_PROVIDER_SITE_OTHER): Payer: No Typology Code available for payment source

## 2021-01-04 DIAGNOSIS — E1165 Type 2 diabetes mellitus with hyperglycemia: Secondary | ICD-10-CM | POA: Diagnosis not present

## 2021-01-04 LAB — HEMOGLOBIN A1C: Hgb A1c MFr Bld: 9 % — ABNORMAL HIGH (ref 4.6–6.5)

## 2021-01-04 NOTE — Addendum Note (Signed)
Addended by: Mervin Kung A on: 01/04/2021 02:13 PM   Modules accepted: Orders

## 2021-01-07 ENCOUNTER — Encounter: Payer: Self-pay | Admitting: Family Medicine

## 2021-01-10 ENCOUNTER — Other Ambulatory Visit (HOSPITAL_BASED_OUTPATIENT_CLINIC_OR_DEPARTMENT_OTHER): Payer: Self-pay

## 2021-01-19 ENCOUNTER — Other Ambulatory Visit (HOSPITAL_BASED_OUTPATIENT_CLINIC_OR_DEPARTMENT_OTHER): Payer: Self-pay

## 2021-01-19 ENCOUNTER — Other Ambulatory Visit: Payer: Self-pay | Admitting: Family Medicine

## 2021-01-19 ENCOUNTER — Other Ambulatory Visit (HOSPITAL_COMMUNITY): Payer: Self-pay

## 2021-01-19 MED ORDER — OLMESARTAN MEDOXOMIL 40 MG PO TABS
40.0000 mg | ORAL_TABLET | Freq: Every day | ORAL | 2 refills | Status: DC
  Filled 2021-01-19 – 2021-04-27 (×2): qty 90, 90d supply, fill #0
  Filled 2021-08-16: qty 90, 90d supply, fill #1
  Filled 2021-11-21: qty 90, 90d supply, fill #2

## 2021-01-20 ENCOUNTER — Other Ambulatory Visit (HOSPITAL_BASED_OUTPATIENT_CLINIC_OR_DEPARTMENT_OTHER): Payer: Self-pay

## 2021-02-21 ENCOUNTER — Other Ambulatory Visit: Payer: Self-pay | Admitting: Family Medicine

## 2021-02-21 ENCOUNTER — Other Ambulatory Visit (HOSPITAL_BASED_OUTPATIENT_CLINIC_OR_DEPARTMENT_OTHER): Payer: Self-pay

## 2021-02-21 DIAGNOSIS — F32A Depression, unspecified: Secondary | ICD-10-CM

## 2021-02-21 MED ORDER — CITALOPRAM HYDROBROMIDE 20 MG PO TABS
ORAL_TABLET | Freq: Every day | ORAL | 1 refills | Status: DC
  Filled 2021-02-21: qty 90, 90d supply, fill #0
  Filled 2021-06-08: qty 90, 90d supply, fill #1

## 2021-02-21 NOTE — Telephone Encounter (Signed)
Has not been prescribed by you?

## 2021-03-18 ENCOUNTER — Other Ambulatory Visit (HOSPITAL_BASED_OUTPATIENT_CLINIC_OR_DEPARTMENT_OTHER): Payer: Self-pay

## 2021-03-19 ENCOUNTER — Encounter: Payer: Self-pay | Admitting: Family Medicine

## 2021-03-29 ENCOUNTER — Other Ambulatory Visit (HOSPITAL_BASED_OUTPATIENT_CLINIC_OR_DEPARTMENT_OTHER): Payer: Self-pay

## 2021-04-27 ENCOUNTER — Other Ambulatory Visit (HOSPITAL_BASED_OUTPATIENT_CLINIC_OR_DEPARTMENT_OTHER): Payer: Self-pay

## 2021-05-06 ENCOUNTER — Encounter: Payer: Self-pay | Admitting: Family Medicine

## 2021-05-06 ENCOUNTER — Telehealth: Payer: Self-pay

## 2021-05-06 ENCOUNTER — Ambulatory Visit (INDEPENDENT_AMBULATORY_CARE_PROVIDER_SITE_OTHER): Payer: No Typology Code available for payment source | Admitting: Family Medicine

## 2021-05-06 ENCOUNTER — Other Ambulatory Visit (HOSPITAL_BASED_OUTPATIENT_CLINIC_OR_DEPARTMENT_OTHER): Payer: Self-pay

## 2021-05-06 VITALS — BP 128/78 | HR 70 | Temp 98.4°F | Ht 72.0 in | Wt 322.5 lb

## 2021-05-06 DIAGNOSIS — J0100 Acute maxillary sinusitis, unspecified: Secondary | ICD-10-CM

## 2021-05-06 DIAGNOSIS — J3089 Other allergic rhinitis: Secondary | ICD-10-CM

## 2021-05-06 DIAGNOSIS — E1165 Type 2 diabetes mellitus with hyperglycemia: Secondary | ICD-10-CM

## 2021-05-06 LAB — COMPREHENSIVE METABOLIC PANEL
ALT: 28 U/L (ref 0–53)
AST: 17 U/L (ref 0–37)
Albumin: 4.4 g/dL (ref 3.5–5.2)
Alkaline Phosphatase: 54 U/L (ref 39–117)
BUN: 12 mg/dL (ref 6–23)
CO2: 35 mEq/L — ABNORMAL HIGH (ref 19–32)
Calcium: 9.5 mg/dL (ref 8.4–10.5)
Chloride: 100 mEq/L (ref 96–112)
Creatinine, Ser: 1 mg/dL (ref 0.40–1.50)
GFR: 92.23 mL/min (ref >60.00)
Glucose, Bld: 109 mg/dL — ABNORMAL HIGH (ref 70–99)
Potassium: 4.5 mEq/L (ref 3.5–5.1)
Sodium: 138 mEq/L (ref 135–145)
Total Bilirubin: 0.4 mg/dL (ref 0.2–1.2)
Total Protein: 7.5 g/dL (ref 6.0–8.3)

## 2021-05-06 LAB — LIPID PANEL
Cholesterol: 254 mg/dL — ABNORMAL HIGH (ref 0–200)
HDL: 37.8 mg/dL — ABNORMAL LOW (ref >39.00)
LDL Cholesterol: 194 mg/dL — ABNORMAL HIGH (ref 0–99)
NonHDL: 215.74
Total CHOL/HDL Ratio: 7
Triglycerides: 108 mg/dL (ref 0.0–149.0)
VLDL: 21.6 mg/dL (ref 0.0–40.0)

## 2021-05-06 LAB — MICROALBUMIN / CREATININE URINE RATIO
Creatinine,U: 252.9 mg/dL
Microalb Creat Ratio: 0.7 mg/g (ref 0.0–30.0)
Microalb, Ur: 1.7 mg/dL (ref 0.0–1.9)

## 2021-05-06 LAB — HEMOGLOBIN A1C: Hgb A1c MFr Bld: 9 % — ABNORMAL HIGH (ref 4.6–6.5)

## 2021-05-06 MED ORDER — FLUTICASONE PROPIONATE 50 MCG/ACT NA SUSP
NASAL | 6 refills | Status: AC
  Filled 2021-05-06: qty 16, 30d supply, fill #0

## 2021-05-06 MED ORDER — OZEMPIC (0.25 OR 0.5 MG/DOSE) 2 MG/1.5ML ~~LOC~~ SOPN: PEN_INJECTOR | SUBCUTANEOUS | 1 refills | Status: AC

## 2021-05-06 MED ORDER — METHYLPREDNISOLONE ACETATE 80 MG/ML IJ SUSP
80.0000 mg | Freq: Once | INTRAMUSCULAR | Status: AC
  Administered 2021-05-06: 80 mg via INTRAMUSCULAR

## 2021-05-06 MED ORDER — DOXYCYCLINE HYCLATE 100 MG PO TABS
100.0000 mg | ORAL_TABLET | Freq: Two times a day (BID) | ORAL | 0 refills | Status: AC
  Filled 2021-05-06: qty 14, 7d supply, fill #0

## 2021-05-06 NOTE — Telephone Encounter (Signed)
Nurse Assessment Nurse: Redmond Pulling, RN, Levada Dy Date/Time (Eastern Time): 05/06/2021 8:00:20 AM Confirm and document reason for call. If symptomatic, describe symptoms. ---Pt c/o nasal and head congestion, nasal drainage, feeling fatigued. Symptoms have persisted for last week. No fever. Does the patient have any new or worsening symptoms? ---Yes Will a triage be completed? ---Yes Related visit to physician within the last 2 weeks? ---No Does the PT have any chronic conditions? (i.e. diabetes, asthma, this includes High risk factors for pregnancy, etc.) ---Yes List chronic conditions. ---HTN, high cholesterol Is this a behavioral health or substance abuse call? ---No Guidelines Guideline Title Affirmed Question Affirmed Notes Nurse Date/Time Eilene Ghazi Time) Sinus Pain or Congestion [1] Redness or swelling on the cheek, forehead or around the eye AND [2] no fever Redmond Pulling, RN, Levada Dy 05/06/2021 8:01:27 AM Disp. Time Eilene Ghazi Time) Disposition Final User PLEASE NOTE: All timestamps contained within this report are represented as [place] Standard Time. CONFIDENTIALTY NOTICE: This fax transmission is intended only for the addressee. It contains information that is legally privileged, confidential or otherwise protected from use or disclosure. If you are not the intended recipient, you are strictly prohibited from reviewing, disclosing, copying using or disseminating any of this information or taking any action in reliance on or regarding this information. If you have received this fax in error, please notify us immediately by telephone so that we can arrange for its return to [place]. Phone: [phone], Toll-Free: [phone], Fax: [phone] Page: 2 of 2 Call Id: [id number] 05/06/2021 8:03:23 AM See HCP within 4 Hours (or PCP triage) Marrion Coy, RN, Marin Shutter Disagree/Comply Comply Caller Understands Yes PreDisposition Call Doctor Care Advice Given Per Guideline SEE HCP (OR PCP  TRIAGE) WITHIN 4 HOURS: * IF OFFICE WILL BE OPEN: You need to be seen within the next 3 or 4 hours. Call your doctor (or NP/PA) now or as soon as the office opens. PAIN MEDICINES: * For pain relief, you can take either acetaminophen, ibuprofen, or naproxen. * They are over-the-counter (OTC) pain drugs. You can buy them at the drugstore. CALL BACK IF: * You become worse Referrals REFERRED TO PCP OFFICE  Appt today

## 2021-05-06 NOTE — Patient Instructions (Addendum)
Continue to push fluids, practice good hand hygiene, and cover your mouth if you cough.  If you start having fevers, shaking or shortness of breath, seek immediate care.  OK to take Tylenol 1000 mg (2 extra strength tabs) or 975 mg (3 regular strength tabs) every 6 hours as needed.  Give [place] 2-3 business days to get the results of your labs back.   Keep the diet clean and stay active.  Let us know if you need anything.

## 2021-05-06 NOTE — Progress Notes (Signed)
Chief Complaint  Patient presents with   Sinus Problem    Congestion Ear pain       [name] here for URI complaints.  Duration: 2 weeks  Associated symptoms: sinus headache, sinus congestion, sinus pain, rhinorrhea, itchy watery eyes, ear pain, myalgia, and foul odor w breath Denies: ear drainage, sore throat, wheezing, shortness of breath, and fever, coughing Treatment to date: INCS, ibuprofen Sick contacts: No   DM- last A1c was around 4 months ago and was 9.0.  He has lost around 10 pounds since his last visit.  He has started taking Ozempic and is on his third dose, currently 0.25 mg weekly.  He will increase to 0.5 mg weekly coming up.  He has no adverse effects with this.  He is also compliant with metformin XR 500 mg daily.  No side effects.  Diet could be better but it is improving.  He is walking.  No chest pain or shortness of breath.  Past Medical History:  Diagnosis Date   Anxiety    Depression    Diabetes mellitus without complication (HCC)    Hyperlipidemia    Hypertension    Hypothyroidism    Obesity    OSA (obstructive sleep apnea)     Objective BP 128/78 (BP Location: Left Arm, Cuff Size: Large)    Pulse 70    Temp 98.4 F (36.9 C) (Oral)    Ht 6' (1.829 m)    Wt (!) 322 lb 8 oz (146.3 kg)    SpO2 98%    BMI 43.74 kg/m  General: Awake, alert, appears stated age HEENT: AT, , ears patent b/l and TM's neg, nares patent w/o discharge, pharynx pink and without exudates, MMM; TTP over maxillary sinuses bilaterally Neck: No masses or asymmetry Heart: RRR, no LE edema Lungs: CTAB, no accessory muscle use Psych: Age appropriate judgment and insight, normal mood and affect  Acute non-recurrent maxillary sinusitis - Plan: fluticasone (FLONASE) 50 MCG/ACT nasal spray, doxycycline (VIBRA-TABS) 100 MG tablet, methylPREDNISolone acetate (DEPO-MEDROL) injection 80 mg  Type 2 diabetes mellitus with hyperglycemia, without long-term current use of insulin (HCC),  Chronic - Plan: Semaglutide,0.25 or 0.5MG /DOS, (OZEMPIC, 0.25 OR 0.5 MG/DOSE,) 2 MG/1.5ML SOPN, Comprehensive metabolic panel, Hemoglobin A1c, Lipid panel, Microalbumin / creatinine urine ratio  Morbid obesity (HCC), Chronic  Other allergic rhinitis - Plan: fluticasone (FLONASE) 50 MCG/ACT nasal spray  Depo-Medrol injection today.  Pocket prescription for doxycycline 100 mg twice daily for 7 days provided to start in 2 days if no improvement.  Tylenol as needed.  Continue to push fluids, practice good hand hygiene, cover mouth when coughing. F/u prn. If starting to experience fevers, shaking, or shortness of breath, seek immediate care. Chronic, not controlled.  Continue Ozempic 0.25 mg weekly, metformin XR 500 mg daily.  Counseled on diet and exercise.  Check labs today.  If 0 improvement, will refer him to endocrinology.  Follow-up pending the results. Pt voiced understanding and agreement to the plan.  Jilda Roche [place], DO 05/06/21 11:46 AM

## 2021-06-07 ENCOUNTER — Ambulatory Visit: Payer: No Typology Code available for payment source | Admitting: Family Medicine

## 2021-06-08 ENCOUNTER — Other Ambulatory Visit: Payer: Self-pay | Admitting: Nurse Practitioner

## 2021-06-08 DIAGNOSIS — E782 Mixed hyperlipidemia: Secondary | ICD-10-CM

## 2021-06-08 DIAGNOSIS — E039 Hypothyroidism, unspecified: Secondary | ICD-10-CM

## 2021-06-09 ENCOUNTER — Other Ambulatory Visit (HOSPITAL_BASED_OUTPATIENT_CLINIC_OR_DEPARTMENT_OTHER): Payer: Self-pay

## 2021-06-14 ENCOUNTER — Other Ambulatory Visit (HOSPITAL_BASED_OUTPATIENT_CLINIC_OR_DEPARTMENT_OTHER): Payer: Self-pay

## 2021-06-14 ENCOUNTER — Ambulatory Visit (INDEPENDENT_AMBULATORY_CARE_PROVIDER_SITE_OTHER): Payer: No Typology Code available for payment source | Admitting: Family Medicine

## 2021-06-14 ENCOUNTER — Encounter: Payer: Self-pay | Admitting: Family Medicine

## 2021-06-14 VITALS — BP 128/72 | HR 85 | Temp 99.1°F | Ht 72.0 in | Wt 318.0 lb

## 2021-06-14 DIAGNOSIS — B353 Tinea pedis: Secondary | ICD-10-CM

## 2021-06-14 DIAGNOSIS — E039 Hypothyroidism, unspecified: Secondary | ICD-10-CM | POA: Diagnosis not present

## 2021-06-14 DIAGNOSIS — E1165 Type 2 diabetes mellitus with hyperglycemia: Secondary | ICD-10-CM | POA: Diagnosis not present

## 2021-06-14 DIAGNOSIS — E782 Mixed hyperlipidemia: Secondary | ICD-10-CM

## 2021-06-14 MED ORDER — METFORMIN HCL ER 500 MG PO TB24: 1000.0000 mg | ORAL_TABLET | Freq: Every day | ORAL | 2 refills | Status: DC

## 2021-06-14 MED ORDER — KETOCONAZOLE 2 % EX CREA
1.0000 "application " | TOPICAL_CREAM | Freq: Every day | CUTANEOUS | 0 refills | Status: AC
  Filled 2021-06-14: qty 30, 30d supply, fill #0

## 2021-06-14 MED ORDER — LEVOTHYROXINE SODIUM 88 MCG PO TABS
ORAL_TABLET | Freq: Every day | ORAL | 2 refills | Status: DC
  Filled 2021-06-14: qty 90, 90d supply, fill #0

## 2021-06-14 MED ORDER — HYDROCHLOROTHIAZIDE 25 MG PO TABS
ORAL_TABLET | Freq: Every day | ORAL | 2 refills | Status: DC
  Filled 2021-06-14: qty 90, 90d supply, fill #0

## 2021-06-14 NOTE — Progress Notes (Signed)
Subjective:  ? ?Chief Complaint  ?Patient presents with  ? Follow-up  ? ? ?[name] is a 46 y.o. male here for follow-up of diabetes.   ?[name] does not monitor his sugars at home.  ?Patient does not require insulin.   ?Medications include: Metformin XR 500 mg in the evening, 1000 mg in the morning, Ozempic 0.5 mg weekly ?Metformin is giving him diarrhea.  He is tolerating the Ozempic well at this time. ?His last A1c was nine 1 month ago ?Diet is fair.  ?Exercise: walking ?No Cp or SOB.  ? ?Past Medical History:  ?Diagnosis Date  ? Anxiety   ? Depression   ? Diabetes mellitus without complication (HCC)   ? Hyperlipidemia   ? Hypertension   ? Hypothyroidism   ? Obesity   ? OSA (obstructive sleep apnea)   ?  ? ?Related testing: ?Retinal exam: Done ?Pneumovax: done ? ?Objective:  ?BP 128/72 (BP Location: Left Arm, Cuff Size: Large)   Pulse 85   Temp 99.1 ?F (37.3 ?C) (Oral)   Ht 6' (1.829 m)   Wt (!) 318 lb (144.2 kg)   SpO2 93%   BMI 43.13 kg/m?  ?General:  Well developed, well nourished, in no apparent distress ?Skin:   ?Head:  Normocephalic, atraumatic ?Eyes:  Pupils equal and round, sclera anicteric without injection  ?Lungs:  CTAB, no access msc use ?Cardio:  RRR, no bruits, no LE edema ?Musculoskeletal:  Symmetrical muscle groups noted without atrophy or deformity ?Neuro:  Sensation intact to pinprick on feet ?Psych: Age appropriate judgment and insight ? ?Assessment:  ? ?Type 2 diabetes mellitus with hyperglycemia, without long-term current use of insulin (HCC) - Plan: HgB A1c, metFORMIN (GLUCOPHAGE XR) 500 MG 24 hr tablet ? ?Tinea pedis of both feet - Plan: ketoconazole (NIZORAL) 2 % cream ? ?Mixed hyperlipidemia - Plan: hydrochlorothiazide (HYDRODIURIL) 25 MG tablet ? ?Acquired hypothyroidism - Plan: levothyroxine (SYNTHROID) 88 MCG tablet  ? ?Plan:  ? ?Chronic, not controlled.  Check A1c today, continue Ozempic 0.5 mg weekly, reduce metformin to 1000 mg daily due to diarrhea.  Will likely need to  add SGLT2 inhibitor pending results.  Counseled on diet and exercise. ?6 weeks of ketoconazole cream. ?F/u in 1 mo. ?The patient voiced understanding and agreement to the plan. ? ?Sharlene Dory, DO ?06/14/21 ?4:55 PM ? ?

## 2021-06-14 NOTE — Patient Instructions (Addendum)
Give [place] 2-3 business days to get the results of your labs back.  ? ?Keep up the good work with your weight loss. ? ?Let us know if you need anything. ? ? ?

## 2021-06-15 ENCOUNTER — Other Ambulatory Visit: Payer: Self-pay | Admitting: Family Medicine

## 2021-06-15 ENCOUNTER — Other Ambulatory Visit (HOSPITAL_COMMUNITY): Payer: Self-pay

## 2021-06-15 LAB — HEMOGLOBIN A1C: Hgb A1c MFr Bld: 8.4 % — ABNORMAL HIGH (ref 4.6–6.5)

## 2021-06-15 MED ORDER — DAPAGLIFLOZIN PROPANEDIOL 10 MG PO TABS
10.0000 mg | ORAL_TABLET | Freq: Every day | ORAL | 2 refills | Status: DC
  Filled 2021-06-15: qty 30, 30d supply, fill #0

## 2021-07-12 ENCOUNTER — Other Ambulatory Visit (HOSPITAL_BASED_OUTPATIENT_CLINIC_OR_DEPARTMENT_OTHER): Payer: Self-pay

## 2021-07-12 ENCOUNTER — Other Ambulatory Visit: Payer: Self-pay | Admitting: Family Medicine

## 2021-07-12 ENCOUNTER — Ambulatory Visit (INDEPENDENT_AMBULATORY_CARE_PROVIDER_SITE_OTHER): Payer: No Typology Code available for payment source | Admitting: Family Medicine

## 2021-07-12 ENCOUNTER — Other Ambulatory Visit (HOSPITAL_COMMUNITY): Payer: Self-pay

## 2021-07-12 ENCOUNTER — Encounter: Payer: Self-pay | Admitting: Family Medicine

## 2021-07-12 VITALS — BP 138/80 | HR 83 | Temp 98.1°F | Resp 16 | Ht 72.0 in | Wt 318.0 lb

## 2021-07-12 DIAGNOSIS — E1165 Type 2 diabetes mellitus with hyperglycemia: Secondary | ICD-10-CM

## 2021-07-12 DIAGNOSIS — J3089 Other allergic rhinitis: Secondary | ICD-10-CM | POA: Diagnosis not present

## 2021-07-12 DIAGNOSIS — I10 Essential (primary) hypertension: Secondary | ICD-10-CM | POA: Diagnosis not present

## 2021-07-12 MED ORDER — AMLODIPINE BESYLATE 10 MG PO TABS
ORAL_TABLET | Freq: Every day | ORAL | 1 refills | Status: DC
  Filled 2021-07-12: qty 90, 90d supply, fill #0
  Filled 2021-10-14: qty 90, 90d supply, fill #1

## 2021-07-12 MED ORDER — SEMAGLUTIDE (1 MG/DOSE) 4 MG/3ML ~~LOC~~ SOPN
1.0000 mg | PEN_INJECTOR | SUBCUTANEOUS | 3 refills | Status: DC
  Filled 2021-07-12 (×2): qty 3, 28d supply, fill #0
  Filled 2021-08-10: qty 3, 28d supply, fill #1
  Filled 2021-09-22: qty 3, 28d supply, fill #2

## 2021-07-12 MED ORDER — MONTELUKAST SODIUM 10 MG PO TABS
10.0000 mg | ORAL_TABLET | Freq: Every day | ORAL | 3 refills | Status: DC
  Filled 2021-07-12 (×2): qty 30, 30d supply, fill #0

## 2021-07-12 NOTE — Patient Instructions (Signed)
Give us 2-3 business days to get the results of your labs back.   Keep the diet clean and stay active.  Let us know if you need anything. 

## 2021-07-12 NOTE — Progress Notes (Addendum)
Chief Complaint  ?Patient presents with  ? Follow-up  ?  Follow Up   ? ? ?Subjective: ?Patient is a 46 y.o. male here for DM f/u. ? ?Last A1c was 8.4 down from 9. He was on Metformin XR 1000 mg/d, anymore causes GI AE's. He is also on Ozempic 0.5 mg/week. Farxiga 10 mg/d was added, but unfortunately he did not pick it up due to not knowing about it/checking his MyChart messages. Reports compliance w meds otherwise, no AE's. Diet is improving. Active at work.  Down another 4 pounds.  No CP or SOB. He has not been checking his sugars at home.  ? ?Seasonal allergies ?He continues to have facial pressure in addition to runny and stuffy nose.  He is compliant with Flonase and Xyzal 5 mg daily.  No adverse effects.  Springtime is the worst for him due to pollen. ? ?Past Medical History:  ?Diagnosis Date  ? Anxiety   ? Depression   ? Diabetes mellitus without complication (HCC)   ? Hyperlipidemia   ? Hypertension   ? Hypothyroidism   ? Obesity   ? OSA (obstructive sleep apnea)   ? ? ?Objective: ?BP 138/80 (BP Location: Right Arm, Patient Position: Sitting, Cuff Size: Normal)   Pulse 83   Temp 98.1 ?F (36.7 ?C) (Oral)   Resp 16   Ht 6' (1.829 m)   Wt (!) 318 lb (144.2 kg)   SpO2 98%   BMI 43.13 kg/m?  ?General: Awake, appears stated age ?Heart: RRR, no bruits ?Lungs: CTAB, no rales, wheezes or rhonchi. No accessory muscle use ?Psych: Age appropriate judgment and insight, normal affect and mood ? ?Assessment and Plan: ?Type 2 diabetes mellitus with hyperglycemia, without long-term current use of insulin (HCC) - Plan: Semaglutide, 1 MG/DOSE, 4 MG/3ML SOPN, Hemoglobin A1c ? ?Other allergic rhinitis - Plan: montelukast (SINGULAIR) 10 MG tablet ? ?Chronic, not controlled.  Increase Ozempic from 0.5 mg weekly to 1 mg weekly.  Continue metformin XR 1000 mg weekly.  We will hold off on adding [place].  Follow-up pending the above results.  Counseled on diet and exercise. ?Chronic, not controlled.  Continue Flonase daily in  addition to Xyzal 5 mg daily.  Add Singulair 10 mg daily. ?The patient voiced understanding and agreement to the plan. ? ?Sharlene Dory, DO ?07/12/21  ?4:46 PM ? ? ? ? ?

## 2021-07-12 NOTE — Addendum Note (Signed)
Addended by: Scharlene Gloss B on: 07/12/2021 04:51 PM ? ? Modules accepted: Orders ? ?

## 2021-07-13 LAB — HEMOGLOBIN A1C: Hgb A1c MFr Bld: 7.9 % — ABNORMAL HIGH (ref 4.6–6.5)

## 2021-08-11 ENCOUNTER — Other Ambulatory Visit (HOSPITAL_BASED_OUTPATIENT_CLINIC_OR_DEPARTMENT_OTHER): Payer: Self-pay

## 2021-08-16 ENCOUNTER — Other Ambulatory Visit (HOSPITAL_BASED_OUTPATIENT_CLINIC_OR_DEPARTMENT_OTHER): Payer: Self-pay

## 2021-09-19 ENCOUNTER — Other Ambulatory Visit (HOSPITAL_BASED_OUTPATIENT_CLINIC_OR_DEPARTMENT_OTHER): Payer: Self-pay

## 2021-09-19 ENCOUNTER — Other Ambulatory Visit: Payer: Self-pay | Admitting: Family Medicine

## 2021-09-19 DIAGNOSIS — F32A Depression, unspecified: Secondary | ICD-10-CM

## 2021-09-19 MED ORDER — CITALOPRAM HYDROBROMIDE 20 MG PO TABS
ORAL_TABLET | Freq: Every day | ORAL | 1 refills | Status: DC
  Filled 2021-09-19: qty 90, 90d supply, fill #0
  Filled 2021-12-27: qty 90, 90d supply, fill #1

## 2021-09-22 ENCOUNTER — Other Ambulatory Visit (HOSPITAL_BASED_OUTPATIENT_CLINIC_OR_DEPARTMENT_OTHER): Payer: Self-pay

## 2021-10-14 ENCOUNTER — Other Ambulatory Visit (HOSPITAL_BASED_OUTPATIENT_CLINIC_OR_DEPARTMENT_OTHER): Payer: Self-pay

## 2021-10-17 ENCOUNTER — Other Ambulatory Visit (HOSPITAL_BASED_OUTPATIENT_CLINIC_OR_DEPARTMENT_OTHER): Payer: Self-pay

## 2021-10-25 ENCOUNTER — Ambulatory Visit (INDEPENDENT_AMBULATORY_CARE_PROVIDER_SITE_OTHER): Payer: No Typology Code available for payment source | Admitting: Family Medicine

## 2021-10-25 ENCOUNTER — Encounter: Payer: Self-pay | Admitting: Family Medicine

## 2021-10-25 VITALS — BP 130/80 | HR 70 | Temp 98.1°F | Ht 72.0 in | Wt 315.5 lb

## 2021-10-25 DIAGNOSIS — Z0000 Encounter for general adult medical examination without abnormal findings: Secondary | ICD-10-CM

## 2021-10-25 DIAGNOSIS — E1165 Type 2 diabetes mellitus with hyperglycemia: Secondary | ICD-10-CM | POA: Diagnosis not present

## 2021-10-25 NOTE — Progress Notes (Signed)
Chief Complaint  Patient presents with   Annual Exam    Well Male [name] is here for a complete physical.   His last physical was >1 year ago.  Current diet: in general, a "healthy" diet.   Current exercise: none Weight trend: decreasing intentionally Fatigue out of ordinary? No. Seat belt? Yes.   Advanced directive? He thinks so  Health maintenance Tetanus- Yes HIV- Yes Hep C- Yes  Past Medical History:  Diagnosis Date   Anxiety    Depression    Diabetes mellitus without complication (HCC)    Hyperlipidemia    Hypertension    Hypothyroidism    Obesity    OSA (obstructive sleep apnea)      Past Surgical History:  Procedure Laterality Date   FRACTURE SURGERY     right arch   HERNIA REPAIR  1990   Right ingruial & umbilical [place]    LEFT HEART CATHETERIZATION WITH CORONARY ANGIOGRAM N/A 09/26/2011   Procedure: LEFT HEART CATHETERIZATION WITH CORONARY ANGIOGRAM;  Surgeon: Pamella Pert, MD;  Location: [facility] CATH LAB;  Service: Cardiovascular;  Laterality: N/A;   Repair Right Arm Fracture  1996   [place]    UVULOPALATOPHARYNGOPLASTY (UPPP)/TONSILLECTOMY/SEPTOPLASTY      Medications  Current Outpatient Medications on File Prior to Visit  Medication Sig Dispense Refill   amLODipine (NORVASC) 10 MG tablet TAKE 1 TABLET BY MOUTH ONCE DAILY 90 tablet 1   aspirin EC 81 MG tablet Take 81 mg by mouth. Only takes occasionally.     citalopram (CELEXA) 20 MG tablet TAKE 1 TABLET BY MOUTH ONCE DAILY 90 tablet 1   fluticasone (FLONASE) 50 MCG/ACT nasal spray PLACE 1 SPRAY INTO BOTH NOSTRILS 2 TIMES DAILY 16 g 6   hydrochlorothiazide (HYDRODIURIL) 25 MG tablet TAKE 1 TABLET BY MOUTH ONCE DAILY 90 tablet 2   levocetirizine (XYZAL) 5 MG tablet Take 1 tablet (5 mg total) by mouth every evening. 30 tablet 2   levothyroxine (SYNTHROID) 88 MCG tablet TAKE 1 TABLET BY MOUTH ONCE DAILY 90 tablet 2   meloxicam (MOBIC) 15 MG tablet Take 1 tablet (15 mg total) by mouth daily.  30 tablet 0   metFORMIN (GLUCOPHAGE XR) 500 MG 24 hr tablet Take 2 tablets (1,000 mg total) by mouth daily with breakfast. 180 tablet 2   montelukast (SINGULAIR) 10 MG tablet Take 1 tablet (10 mg total) by mouth at bedtime. 30 tablet 3   olmesartan (BENICAR) 40 MG tablet Take 1 tablet (40 mg total) by mouth daily. 90 tablet 2   Semaglutide, 1 MG/DOSE, 4 MG/3ML SOPN Inject 1 mg as directed once a week. 3 mL 3   rosuvastatin (CRESTOR) 40 MG tablet TAKE 1 TABLET BY MOUTH ONCE DAILY 90 tablet 1    Allergies Allergies  Allergen Reactions   Atorvastatin Other (See Comments)    Elevated liver enzymes   Lisinopril Cough   Cymbalta [Duloxetine Hcl] Rash    Family History Family History  Problem Relation Age of Onset   Hypertension Mother    Hypertension Father    Heart attack Father    Breast cancer Paternal Grandmother    Diabetes Paternal Grandmother    Kidney disease Maternal Grandfather    Down syndrome Brother     Review of Systems: Constitutional: no fevers or chills Eye:  no recent significant change in vision Ear/Nose/Mouth/Throat:  Ears:  no hearing loss Nose/Mouth/Throat:  no complaints of nasal congestion, no sore throat Cardiovascular:  no chest pain Respiratory:  no shortness of breath Gastrointestinal:  no abdominal pain, no change in bowel habits GU:  Male: negative for dysuria, frequency, and incontinence Musculoskeletal/Extremities:  no pain of the joints Integumentary (Skin/Breast):  no abnormal skin lesions reported Neurologic:  no headaches Endocrine: No unexpected weight changes Hematologic/Lymphatic:  no night sweats  Exam BP 130/80   Pulse 70   Temp 98.1 F (36.7 C) (Oral)   Ht 6' (1.829 m)   Wt (!) 315 lb 8 oz (143.1 kg)   SpO2 99%   BMI 42.79 kg/m  General:  well developed, well nourished, in no apparent distress Skin:  no significant moles, warts, or growths Head:  no masses, lesions, or tenderness Eyes:  pupils equal and round, sclera  anicteric without injection Ears:  canals without lesions, TMs shiny without retraction, no obvious effusion, no erythema Nose:  nares patent, septum midline, mucosa normal Throat/Pharynx:  lips and gingiva without lesion; tongue and uvula midline; non-inflamed pharynx; no exudates or postnasal drainage Neck: neck supple without adenopathy, thyromegaly, or masses Lungs:  clear to auscultation, breath sounds equal bilaterally, no respiratory distress Cardio:  regular rate and rhythm, no bruits, no LE edema Abdomen:  abdomen soft, nontender; bowel sounds normal; no masses or organomegaly Rectal: Deferred Musculoskeletal:  symmetrical muscle groups noted without atrophy or deformity Extremities:  no clubbing, cyanosis, or edema, no deformities, no skin discoloration Neuro:  gait normal; deep tendon reflexes normal and symmetric Psych: well oriented with normal range of affect and appropriate judgment/insight  Assessment and Plan  Well adult exam - Plan: CBC, Comprehensive metabolic panel, Lipid panel  Type 2 diabetes mellitus with hyperglycemia, without long-term current use of insulin (HCC) - Plan: Hemoglobin A1c   Well 46 y.o. male. Counseled on diet and exercise. Counseled on risks and benefits of prostate cancer screening with PSA. The patient agrees to forego screening.  Advanced directive form provided today.  Other orders as above. Follow up in 3-6 pending the above workup. The patient voiced understanding and agreement to the plan.  Frank Roche [place], DO 10/25/21 3:51 PM

## 2021-10-25 NOTE — Patient Instructions (Addendum)
Give [place] 2-3 business days to get the results of your labs back.   Keep the diet clean and stay active.  Please get me a copy of your advanced directive form at your convenience.   I recommend getting the flu shot in mid October. This suggestion would change if the CDC comes out with a different recommendation.   OK to use Debrox (peroxide) in the ear to loosen up wax. Also recommend using a bulb syringe (for removing boogers from baby's noses) to flush through warm water and vinegar (3-4:1 ratio). An alternative, though more expensive, is an elephant ear washer wax removal kit. Do not use Q-tips as this can impact wax further.  Stay hydrated.  Take Metamucil or Benefiber daily for constipation issues.   Let us know if you need anything.

## 2021-10-26 ENCOUNTER — Other Ambulatory Visit: Payer: Self-pay | Admitting: Family Medicine

## 2021-10-26 ENCOUNTER — Other Ambulatory Visit (HOSPITAL_COMMUNITY): Payer: Self-pay

## 2021-10-26 LAB — LIPID PANEL
Cholesterol: 184 mg/dL (ref 0–200)
HDL: 36.6 mg/dL — ABNORMAL LOW (ref >39.00)
LDL Cholesterol: 123 mg/dL — ABNORMAL HIGH (ref 0–99)
NonHDL: 147.77
Total CHOL/HDL Ratio: 5
Triglycerides: 123 mg/dL (ref 0.0–149.0)
VLDL: 24.6 mg/dL (ref 0.0–40.0)

## 2021-10-26 LAB — COMPREHENSIVE METABOLIC PANEL
ALT: 29 U/L (ref 0–53)
AST: 17 U/L (ref 0–37)
Albumin: 4.2 g/dL (ref 3.5–5.2)
Alkaline Phosphatase: 56 U/L (ref 39–117)
BUN: 15 mg/dL (ref 6–23)
CO2: 29 mEq/L (ref 19–32)
Calcium: 9 mg/dL (ref 8.4–10.5)
Chloride: 102 mEq/L (ref 96–112)
Creatinine, Ser: 1.15 mg/dL (ref 0.40–1.50)
GFR: 77.73 mL/min (ref >60.00)
Glucose, Bld: 93 mg/dL (ref 70–99)
Potassium: 4.1 mEq/L (ref 3.5–5.1)
Sodium: 138 mEq/L (ref 135–145)
Total Bilirubin: 0.3 mg/dL (ref 0.2–1.2)
Total Protein: 7.2 g/dL (ref 6.0–8.3)

## 2021-10-26 LAB — CBC
HCT: 42.8 % (ref 39.0–52.0)
Hemoglobin: 13.8 g/dL (ref 13.0–17.0)
MCHC: 32.3 g/dL (ref 30.0–36.0)
MCV: 82.1 fl (ref 78.0–100.0)
Platelets: 309 10*3/uL (ref 150.0–400.0)
RBC: 5.21 Mil/uL (ref 4.22–5.81)
RDW: 16.2 % — ABNORMAL HIGH (ref 11.5–15.5)
WBC: 5.9 10*3/uL (ref 4.0–10.5)

## 2021-10-26 LAB — HEMOGLOBIN A1C: Hgb A1c MFr Bld: 7.6 % — ABNORMAL HIGH (ref 4.6–6.5)

## 2021-10-26 MED ORDER — SEMAGLUTIDE (2 MG/DOSE) 8 MG/3ML ~~LOC~~ SOPN
2.0000 mg | PEN_INJECTOR | SUBCUTANEOUS | 3 refills | Status: DC
  Filled 2021-10-26: qty 3, 28d supply, fill #0
  Filled 2021-11-30: qty 3, 28d supply, fill #1
  Filled 2021-12-27: qty 3, 28d supply, fill #2
  Filled 2022-03-31: qty 3, 28d supply, fill #3

## 2021-11-02 ENCOUNTER — Other Ambulatory Visit (HOSPITAL_BASED_OUTPATIENT_CLINIC_OR_DEPARTMENT_OTHER): Payer: Self-pay

## 2021-11-21 ENCOUNTER — Other Ambulatory Visit: Payer: Self-pay | Admitting: Family Medicine

## 2021-11-21 ENCOUNTER — Other Ambulatory Visit (HOSPITAL_BASED_OUTPATIENT_CLINIC_OR_DEPARTMENT_OTHER): Payer: Self-pay

## 2021-11-21 DIAGNOSIS — E119 Type 2 diabetes mellitus without complications: Secondary | ICD-10-CM

## 2021-11-21 MED ORDER — ROSUVASTATIN CALCIUM 40 MG PO TABS
40.0000 mg | ORAL_TABLET | Freq: Every day | ORAL | 1 refills | Status: DC
  Filled 2021-11-21: qty 90, 90d supply, fill #0
  Filled 2022-07-28: qty 90, 90d supply, fill #1

## 2021-11-30 ENCOUNTER — Other Ambulatory Visit (HOSPITAL_BASED_OUTPATIENT_CLINIC_OR_DEPARTMENT_OTHER): Payer: Self-pay

## 2021-12-27 ENCOUNTER — Other Ambulatory Visit (HOSPITAL_BASED_OUTPATIENT_CLINIC_OR_DEPARTMENT_OTHER): Payer: Self-pay

## 2022-01-04 ENCOUNTER — Other Ambulatory Visit (HOSPITAL_BASED_OUTPATIENT_CLINIC_OR_DEPARTMENT_OTHER): Payer: Self-pay

## 2022-01-08 ENCOUNTER — Emergency Department (HOSPITAL_BASED_OUTPATIENT_CLINIC_OR_DEPARTMENT_OTHER): Payer: No Typology Code available for payment source

## 2022-01-08 ENCOUNTER — Other Ambulatory Visit: Payer: Self-pay

## 2022-01-08 ENCOUNTER — Encounter (HOSPITAL_BASED_OUTPATIENT_CLINIC_OR_DEPARTMENT_OTHER): Payer: Self-pay | Admitting: Emergency Medicine

## 2022-01-08 ENCOUNTER — Emergency Department (HOSPITAL_BASED_OUTPATIENT_CLINIC_OR_DEPARTMENT_OTHER)
Admission: EM | Admit: 2022-01-08 | Discharge: 2022-01-08 | Disposition: A | Discharge status: Home / Self Care | Payer: No Typology Code available for payment source | Attending: Emergency Medicine | Admitting: Emergency Medicine

## 2022-01-08 DIAGNOSIS — Z7984 Long term (current) use of oral hypoglycemic drugs: Secondary | ICD-10-CM | POA: Insufficient documentation

## 2022-01-08 DIAGNOSIS — Z7982 Long term (current) use of aspirin: Secondary | ICD-10-CM | POA: Diagnosis not present

## 2022-01-08 DIAGNOSIS — E119 Type 2 diabetes mellitus without complications: Secondary | ICD-10-CM | POA: Insufficient documentation

## 2022-01-08 DIAGNOSIS — E039 Hypothyroidism, unspecified: Secondary | ICD-10-CM | POA: Diagnosis not present

## 2022-01-08 DIAGNOSIS — Z79899 Other long term (current) drug therapy: Secondary | ICD-10-CM | POA: Diagnosis not present

## 2022-01-08 DIAGNOSIS — R509 Fever, unspecified: Secondary | ICD-10-CM | POA: Diagnosis present

## 2022-01-08 DIAGNOSIS — U071 COVID-19: Secondary | ICD-10-CM | POA: Insufficient documentation

## 2022-01-08 DIAGNOSIS — I10 Essential (primary) hypertension: Secondary | ICD-10-CM | POA: Diagnosis not present

## 2022-01-08 LAB — RESP PANEL BY RT-PCR (FLU A&B, COVID) ARPGX2
Influenza A by PCR: NEGATIVE
Influenza B by PCR: NEGATIVE
SARS Coronavirus 2 by RT PCR: POSITIVE — AB

## 2022-01-08 MED ORDER — ONDANSETRON 4 MG PO TBDP
8.0000 mg | ORAL_TABLET | Freq: Once | ORAL | Status: AC
Start: 2022-01-08 — End: 2022-01-08
  Administered 2022-01-08: 8 mg via ORAL
  Filled 2022-01-08: qty 2

## 2022-01-08 MED ORDER — ACETAMINOPHEN 500 MG PO TABS
1000.0000 mg | ORAL_TABLET | Freq: Once | ORAL | Status: AC
Start: 2022-01-08 — End: 2022-01-08
  Administered 2022-01-08: 1000 mg via ORAL
  Filled 2022-01-08: qty 2

## 2022-01-08 NOTE — Discharge Instructions (Signed)
You have tested positive for COVID-19. Please stay home for at least 5 days from symptom onset. Isolate from others in your home. You are likely most infectious during these first 5 days.  You may end isolation after day 5 if you are fever free for 24 hours AND your symptoms are improving. Please continue to wear a mask in public until day 10.  ° °If you have?moderate illness?(if you experienced shortness of breath or had difficulty breathing), or?severe illness?(you were hospitalized) due to COVID-19, or you have a weakened immune system, you need to isolate through day 10. ° °Please return if you develop difficulty breathing, chest pain, or worsening symptoms after day 5, please return to the emergency department. ° ° °

## 2022-01-08 NOTE — ED Triage Notes (Signed)
Pt reports fever, body aches, vomited x 2; took ibuprofen at 1700

## 2022-01-08 NOTE — ED Provider Notes (Signed)
[facility] EMERGENCY DEPARTMENT Provider Note   CSN: [id number] Arrival date & time: 01/08/22  2049     History PMH: HLD, Hypothyroidism, Obesity, OSA, HTN, DM 2 Chief Complaint  Patient presents with   Fever    [name] is a 46 y.o. male. Patient presents emergency department with a chief complaint of fever, chills, body aches, nasal congestion, and nonproductive cough that started today.  He says around 4 PM he vomited twice and has been feeling nauseous.  He has not been around anyone sick that he knows of.  He denies any chest pain, shortness of breath, abdominal pain, urinary symptoms, ear pain, diarrhea.   Fever Associated symptoms: chills, congestion, cough, nausea and vomiting        Home Medications Prior to Admission medications   Medication Sig Start Date End Date Taking? Authorizing Provider  amLODipine (NORVASC) 10 MG tablet TAKE 1 TABLET BY MOUTH ONCE DAILY 07/12/21 07/12/22  Sharlene Dory, DO  aspirin EC 81 MG tablet Take 81 mg by mouth. Only takes occasionally.    [provider]  citalopram (CELEXA) 20 MG tablet TAKE 1 TABLET BY MOUTH ONCE DAILY 09/19/21 09/19/22  Sharlene Dory, DO  fluticasone [facility]) 50 MCG/ACT nasal spray PLACE 1 SPRAY INTO BOTH NOSTRILS 2 TIMES DAILY 05/06/21 05/06/22  Sharlene Dory, DO  hydrochlorothiazide (HYDRODIURIL) 25 MG tablet TAKE 1 TABLET BY MOUTH ONCE DAILY 06/14/21 06/14/22  Wendling, Jilda Roche, DO  levocetirizine (XYZAL) 5 MG tablet Take 1 tablet (5 mg total) by mouth every evening. 09/28/20   Sharlene Dory, DO  levothyroxine (SYNTHROID) 88 MCG tablet TAKE 1 TABLET BY MOUTH ONCE DAILY 06/14/21 06/14/22  Sharlene Dory, DO  meloxicam (MOBIC) 15 MG tablet Take 1 tablet (15 mg total) by mouth daily. 10/27/20   Sharlene Dory, DO  metFORMIN (GLUCOPHAGE XR) 500 MG 24 hr tablet Take 2 tablets (1,000 mg total) by mouth daily with breakfast. 06/14/21   Carmelia Roller,  Jilda Roche, DO  montelukast (SINGULAIR) 10 MG tablet Take 1 tablet (10 mg total) by mouth at bedtime. 07/12/21   Sharlene Dory, DO  olmesartan (BENICAR) 40 MG tablet Take 1 tablet (40 mg total) by mouth daily. 01/19/21   Sharlene Dory, DO  rosuvastatin (CRESTOR) 40 MG tablet Take 1 tablet (40 mg total) by mouth daily. 11/21/21 11/21/22  Sharlene Dory, DO  Semaglutide, 2 MG/DOSE, 8 MG/3ML SOPN Inject 2 mg as directed once a week. 10/26/21   Sharlene Dory, DO      Allergies    Atorvastatin, Lisinopril, and Cymbalta [duloxetine hcl]    Review of Systems   Review of Systems  Constitutional:  Positive for chills and fever.  HENT:  Positive for congestion.   Respiratory:  Positive for cough.   Gastrointestinal:  Positive for nausea and vomiting.  All other systems reviewed and are negative.   Physical Exam Updated Vital Signs BP 139/72   Pulse 88   Temp (!) 101.2 F (38.4 C) (Oral)   Resp 20   Ht 6' (1.829 m)   Wt (!) 141.5 kg   SpO2 96%   BMI 42.31 kg/m  Physical Exam Vitals and nursing note reviewed.  Constitutional:      General: He is not in acute distress.    Appearance: Normal appearance. He is not ill-appearing, toxic-appearing or diaphoretic.  HENT:     Head: Normocephalic and atraumatic.     Right Ear: Tympanic membrane, ear canal  and external ear normal. There is no impacted cerumen.     Left Ear: Tympanic membrane, ear canal and external ear normal. There is no impacted cerumen.     Nose: Nose normal. No nasal deformity.     Mouth/Throat:     Lips: Pink. No lesions.     Mouth: Mucous membranes are moist. No injury, lacerations, oral lesions or angioedema.     Pharynx: Oropharynx is clear. Uvula midline. No pharyngeal swelling, oropharyngeal exudate, posterior oropharyngeal erythema or uvula swelling.     Tonsils: No tonsillar exudate or tonsillar abscesses. 0 on the right. 0 on the left.  Eyes:     General: Gaze aligned  appropriately. No scleral icterus.       Right eye: No discharge.        Left eye: No discharge.     Conjunctiva/sclera: Conjunctivae normal.     Right eye: Right conjunctiva is not injected. No exudate or hemorrhage.    Left eye: Left conjunctiva is not injected. No exudate or hemorrhage. Cardiovascular:     Rate and Rhythm: Normal rate and regular rhythm.     Pulses: Normal pulses.          Radial pulses are 2+ on the right side and 2+ on the left side.       Dorsalis pedis pulses are 2+ on the right side and 2+ on the left side.     Heart sounds: Normal heart sounds, S1 normal and S2 normal. Heart sounds not distant. No murmur heard.    No friction rub. No gallop. No S3 or S4 sounds.  Pulmonary:     Effort: Pulmonary effort is normal. No accessory muscle usage or respiratory distress.     Breath sounds: Normal breath sounds. No stridor. No wheezing, rhonchi or rales.  Chest:     Chest wall: No tenderness.  Abdominal:     General: Abdomen is flat. There is no distension.     Palpations: Abdomen is soft. There is no mass or pulsatile mass.     Tenderness: There is no abdominal tenderness. There is no guarding or rebound.  Musculoskeletal:     Right lower leg: No edema.     Left lower leg: No edema.  Lymphadenopathy:     Cervical: No cervical adenopathy.  Skin:    General: Skin is warm and dry.     Coloration: Skin is not jaundiced or pale.     Findings: No bruising, erythema, lesion or rash.  Neurological:     General: No focal deficit present.     Mental Status: He is alert and oriented to person, place, and time.     GCS: GCS eye subscore is 4. GCS verbal subscore is 5. GCS motor subscore is 6.  Psychiatric:        Mood and Affect: Mood normal.        Behavior: Behavior normal. Behavior is cooperative.     ED Results / Procedures / Treatments   Labs (all labs ordered are listed, but only abnormal results are displayed) Labs Reviewed  RESP PANEL BY RT-PCR (FLU A&B,  COVID) ARPGX2 - Abnormal; Notable for the following components:      Result Value   SARS Coronavirus 2 by RT PCR POSITIVE (*)    All other components within normal limits    EKG None  Radiology DG Chest 2 View  Result Date: 01/08/2022 CLINICAL DATA:  Fever, pneumonia, body aches EXAM: CHEST - 2 VIEW COMPARISON:  01/15/2020 FINDINGS: The heart size and mediastinal contours are within normal limits. Both lungs are clear. The visualized skeletal structures are unremarkable. IMPRESSION: Negative. Electronically Signed   By: Charlett Nose M.D.   On: 01/08/2022 21:27    Procedures Procedures   Medications Ordered in ED Medications  acetaminophen (TYLENOL) tablet 1,000 mg (1,000 mg Oral Given 01/08/22 2111)  ondansetron (ZOFRAN-ODT) disintegrating tablet 8 mg (8 mg Oral Given 01/08/22 2111)    ED Course/ Medical Decision Making/ A&P Clinical Course as of 01/08/22 2231  Sun Jan 08, 2022  2220 Resp Panel by RT-PCR (Flu A&B, Covid) Anterior Nasal Swab [GL]    Clinical Course User Index [GL] Claudie Leach, PA-C                           Medical Decision Making Amount and/or Complexity of Data Reviewed Labs:  Decision-making details documented in ED Course. Radiology: ordered.  Risk OTC drugs. Prescription drug management.   He Is presenting to the emergency department with upper respiratory symptoms as well as nausea and vomiting that occurred today.  On arrival here he is in no acute distress.  He does have a fever up to 101.2 but has normal vitals otherwise.  Exam is unremarkable.  He tested positive for COVID-19 and had a normal chest x-ray.  He has remained with oxygen is above 92% on room air.  Given patient's comorbidities, I discussed antiviral treatment with the patient, however he declines this at this time.  I have discussed Supportive treatments at home. Return precautions provided.    Final Clinical Impression(s) / ED Diagnoses Final diagnoses:  COVID-19    Rx /  DC Orders ED Discharge Orders     None         Claudie Leach, PA-C 01/08/22 2231    Virgina Norfolk, DO 01/08/22 2233

## 2022-01-10 ENCOUNTER — Other Ambulatory Visit (HOSPITAL_BASED_OUTPATIENT_CLINIC_OR_DEPARTMENT_OTHER): Payer: Self-pay

## 2022-01-27 ENCOUNTER — Ambulatory Visit (INDEPENDENT_AMBULATORY_CARE_PROVIDER_SITE_OTHER): Payer: No Typology Code available for payment source | Admitting: Family Medicine

## 2022-01-27 ENCOUNTER — Other Ambulatory Visit (HOSPITAL_BASED_OUTPATIENT_CLINIC_OR_DEPARTMENT_OTHER): Payer: Self-pay

## 2022-01-27 ENCOUNTER — Other Ambulatory Visit: Payer: Self-pay | Admitting: Family Medicine

## 2022-01-27 ENCOUNTER — Encounter: Payer: Self-pay | Admitting: Family Medicine

## 2022-01-27 VITALS — BP 138/84 | HR 80 | Temp 98.3°F | Ht 72.0 in | Wt 312.0 lb

## 2022-01-27 DIAGNOSIS — E1165 Type 2 diabetes mellitus with hyperglycemia: Secondary | ICD-10-CM | POA: Diagnosis not present

## 2022-01-27 DIAGNOSIS — E039 Hypothyroidism, unspecified: Secondary | ICD-10-CM | POA: Diagnosis not present

## 2022-01-27 MED ORDER — OLMESARTAN MEDOXOMIL 40 MG PO TABS
40.0000 mg | ORAL_TABLET | Freq: Every day | ORAL | 3 refills | Status: DC
  Filled 2022-01-27 – 2022-03-31 (×2): qty 90, 90d supply, fill #0
  Filled 2022-07-18: qty 90, 90d supply, fill #1
  Filled 2022-10-26: qty 90, 90d supply, fill #2

## 2022-01-27 MED ORDER — AMLODIPINE BESYLATE 10 MG PO TABS
10.0000 mg | ORAL_TABLET | Freq: Every day | ORAL | 3 refills | Status: DC
  Filled 2022-01-27: qty 90, 90d supply, fill #0
  Filled 2022-05-18: qty 90, 90d supply, fill #1
  Filled 2022-09-04: qty 90, 90d supply, fill #2
  Filled 2022-12-15: qty 90, 90d supply, fill #3

## 2022-01-27 MED ORDER — HYDROCHLOROTHIAZIDE 25 MG PO TABS
25.0000 mg | ORAL_TABLET | Freq: Every day | ORAL | 3 refills | Status: DC
  Filled 2022-01-27: qty 90, 90d supply, fill #0

## 2022-01-27 MED ORDER — OLMESARTAN-AMLODIPINE-HCTZ 40-10-25 MG PO TABS
1.0000 | ORAL_TABLET | Freq: Every day | ORAL | 2 refills | Status: DC
  Filled 2022-01-27: qty 87, 87d supply, fill #0
  Filled 2022-01-27: qty 3, 3d supply, fill #0

## 2022-01-27 NOTE — Progress Notes (Signed)
Subjective:   Chief Complaint  Patient presents with   Follow-up    3 month DM    [name] is a 46 y.o. male here for follow-up of diabetes.   [name] does not monitor his sugars routinely.  Patient does not require insulin.   Medications include: Ozempic 2 mg/week, metformin XR 1000 mg bid Diet is healhty overall, decreased portions.  Exercise: active at work. No CP or SOB.   Hypothyroidism Patient presents for follow-up of hypothyroidism.  Reports compliance with medication-levothyroxine 88 mcg daily. Current symptoms include: denies fatigue, weight changes, heat/cold intolerance, bowel/skin changes or CVS symptoms He believes his dose should be not significantly changed  Past Medical History:  Diagnosis Date   Anxiety    Depression    Diabetes mellitus without complication (HCC)    Hyperlipidemia    Hypertension    Hypothyroidism    Obesity    OSA (obstructive sleep apnea)      Related testing: Retinal exam: Done Pneumovax: done  Objective:  BP 138/84 (BP Location: Left Arm, Cuff Size: Large)   Pulse 80   Temp 98.3 F (36.8 C) (Oral)   Ht 6' (1.829 m)   Wt (!) 312 lb (141.5 kg)   SpO2 96%   BMI 42.31 kg/m  General:  Well developed, well nourished, in no apparent distress Lungs:  CTAB, no access msc use Cardio:  RRR, no bruits, no LE edema Psych: Age appropriate judgment and insight  Assessment:   Type 2 diabetes mellitus with hyperglycemia, without long-term current use of insulin (HCC) - Plan: Hemoglobin A1c  Acquired hypothyroidism - Plan: TSH   Plan:   Chronic, unstable.  Continue Ozempic 2 mg weekly, metformin XR 1000 mg twice daily.  Counseled on diet and exercise.  Goal is <7.  We will start Farxiga 10 mg daily in addition to the above if not controlled. Chronic, stable.  Continue levothyroxine 88 mcg daily.  Check labs. Follow-up pending the above. The patient voiced understanding and agreement to the plan.  Frank Roche Garvin,  DO 01/27/22 3:47 PM

## 2022-01-27 NOTE — Patient Instructions (Signed)
Give us 2-3 business days to get the results of your labs back.   Keep the diet clean and stay active.  Let us know if you need anything. 

## 2022-01-28 ENCOUNTER — Other Ambulatory Visit: Payer: Self-pay | Admitting: Family Medicine

## 2022-01-28 LAB — HEMOGLOBIN A1C
Hgb A1c MFr Bld: 7.5 % of total Hgb — ABNORMAL HIGH (ref <5.7)
Mean Plasma Glucose: 169 mg/dL
eAG (mmol/L): 9.3 mmol/L

## 2022-01-28 LAB — TSH: TSH: 2.46 mIU/L (ref 0.40–4.50)

## 2022-01-28 MED ORDER — DAPAGLIFLOZIN PROPANEDIOL 10 MG PO TABS
10.0000 mg | ORAL_TABLET | Freq: Every day | ORAL | 3 refills | Status: DC
  Filled 2022-01-28: qty 30, 30d supply, fill #0

## 2022-01-30 ENCOUNTER — Other Ambulatory Visit (HOSPITAL_BASED_OUTPATIENT_CLINIC_OR_DEPARTMENT_OTHER): Payer: Self-pay

## 2022-02-01 ENCOUNTER — Other Ambulatory Visit (HOSPITAL_BASED_OUTPATIENT_CLINIC_OR_DEPARTMENT_OTHER): Payer: Self-pay

## 2022-02-06 ENCOUNTER — Other Ambulatory Visit (HOSPITAL_BASED_OUTPATIENT_CLINIC_OR_DEPARTMENT_OTHER): Payer: Self-pay

## 2022-02-10 ENCOUNTER — Other Ambulatory Visit (HOSPITAL_BASED_OUTPATIENT_CLINIC_OR_DEPARTMENT_OTHER): Payer: Self-pay

## 2022-03-31 ENCOUNTER — Other Ambulatory Visit (HOSPITAL_BASED_OUTPATIENT_CLINIC_OR_DEPARTMENT_OTHER): Payer: Self-pay

## 2022-04-13 ENCOUNTER — Other Ambulatory Visit (HOSPITAL_BASED_OUTPATIENT_CLINIC_OR_DEPARTMENT_OTHER): Payer: Self-pay

## 2022-04-13 ENCOUNTER — Other Ambulatory Visit: Payer: Self-pay | Admitting: Family Medicine

## 2022-04-13 DIAGNOSIS — F419 Anxiety disorder, unspecified: Secondary | ICD-10-CM

## 2022-04-13 MED ORDER — CITALOPRAM HYDROBROMIDE 20 MG PO TABS
20.0000 mg | ORAL_TABLET | Freq: Every day | ORAL | 1 refills | Status: DC
  Filled 2022-04-13: qty 90, 90d supply, fill #0
  Filled 2022-07-18: qty 90, 90d supply, fill #1

## 2022-05-01 ENCOUNTER — Ambulatory Visit: Payer: Self-pay | Admitting: Family Medicine

## 2022-05-08 ENCOUNTER — Ambulatory Visit (INDEPENDENT_AMBULATORY_CARE_PROVIDER_SITE_OTHER): Payer: 59 | Admitting: Family Medicine

## 2022-05-08 ENCOUNTER — Encounter: Payer: Self-pay | Admitting: Family Medicine

## 2022-05-08 ENCOUNTER — Other Ambulatory Visit (HOSPITAL_BASED_OUTPATIENT_CLINIC_OR_DEPARTMENT_OTHER): Payer: Self-pay

## 2022-05-08 ENCOUNTER — Other Ambulatory Visit: Payer: Self-pay | Admitting: Family Medicine

## 2022-05-08 VITALS — BP 146/94 | HR 90 | Temp 99.8°F | Ht 72.0 in | Wt 317.0 lb

## 2022-05-08 DIAGNOSIS — R030 Elevated blood-pressure reading, without diagnosis of hypertension: Secondary | ICD-10-CM | POA: Diagnosis not present

## 2022-05-08 DIAGNOSIS — U071 COVID-19: Secondary | ICD-10-CM | POA: Diagnosis not present

## 2022-05-08 DIAGNOSIS — I10 Essential (primary) hypertension: Secondary | ICD-10-CM

## 2022-05-08 DIAGNOSIS — R6889 Other general symptoms and signs: Secondary | ICD-10-CM | POA: Diagnosis not present

## 2022-05-08 DIAGNOSIS — E1165 Type 2 diabetes mellitus with hyperglycemia: Secondary | ICD-10-CM

## 2022-05-08 LAB — POCT INFLUENZA A/B
Influenza A, POC: NEGATIVE
Influenza B, POC: NEGATIVE

## 2022-05-08 LAB — POC COVID19 BINAXNOW: SARS Coronavirus 2 Ag: POSITIVE — AB

## 2022-05-08 LAB — HEMOGLOBIN A1C: Hgb A1c MFr Bld: 7.7 % — ABNORMAL HIGH (ref 4.6–6.5)

## 2022-05-08 MED ORDER — TIRZEPATIDE 15 MG/0.5ML ~~LOC~~ SOAJ
15.0000 mg | SUBCUTANEOUS | 5 refills | Status: DC
  Filled 2022-05-08 – 2022-09-04 (×2): qty 2, 28d supply, fill #0
  Filled 2022-12-15: qty 2, 28d supply, fill #1
  Filled 2023-04-02: qty 2, 28d supply, fill #2

## 2022-05-08 MED ORDER — ALBUTEROL SULFATE HFA 108 (90 BASE) MCG/ACT IN AERS
2.0000 | INHALATION_SPRAY | Freq: Four times a day (QID) | RESPIRATORY_TRACT | 0 refills | Status: DC | PRN
  Filled 2022-05-08: qty 6.7, 20d supply, fill #0

## 2022-05-08 MED ORDER — TIRZEPATIDE 12.5 MG/0.5ML ~~LOC~~ SOAJ
12.5000 mg | SUBCUTANEOUS | 0 refills | Status: AC
  Filled 2022-05-08: qty 2, 28d supply, fill #0

## 2022-05-08 NOTE — Patient Instructions (Addendum)
Keep an eye on your blood pressure at home.   I want your blood pressure less than 140 on the top and less than 90 on the bottom consistently. Both goals must be met (ie, 150/70 is too high even though the 70 on the bottom is desirable).   Keep the diet clean and stay active.  Give [place] 2-3 business days to get the results of your labs back.   Continue to push fluids, practice good hand hygiene, and cover your mouth if you cough.  If you start having fevers, shaking or shortness of breath, seek immediate care.  For the first 5 days you should quarantine. Day 6-10 you can return to society with a mask as long as you are fever-free and have improving or resolved symptoms. Day 11 and beyond you can return maskless as long as you are fever-free with improved/resolved symptoms.   Let us know if you need anything.

## 2022-05-08 NOTE — Progress Notes (Signed)
Subjective:   Chief Complaint  Patient presents with   Wheezing    Fever on Friday/Saturday. Fever gone by Sunday     [name] is a 47 y.o. male here for follow-up of diabetes.   [name] not routinely monitor his sugars.  Patient does not require insulin.   Medications include: Ozempic 2 mg/week, Metformin XR 1000 mg/d Diet is fair.  Exercise: walking  URI Duration: 3 days  Associated symptoms: Fever (101 F), sinus congestion, sinus pain, rhinorrhea, itchy watery eyes, wheezing, shortness of breath, myalgia, diarrhea, nausea and coughing Denies: ear pain, ear drainage, sore throat, and vomiting, loss of taste/smell Treatment to date: ibuprofen/Tylenol Sick contacts: Yes; son dx'd w covid.   Past Medical History:  Diagnosis Date   Anxiety    Depression    Diabetes mellitus without complication (HCC)    Hyperlipidemia    Hypertension    Hypothyroidism    Obesity    OSA (obstructive sleep apnea)      Related testing: Retinal exam: Done Pneumovax: done  Objective:  BP (!) 146/94 (BP Location: Left Arm, Cuff Size: Large)   Pulse 90   Temp 99.8 F (37.7 C) (Oral)   Ht 6' (1.829 m)   Wt (!) 317 lb (143.8 kg)   SpO2 97%   BMI 42.99 kg/m  General:  Well developed, well nourished, in no apparent distress Skin:  Warm, no pallor or diaphoresis Head:  Normocephalic, atraumatic Eyes:  Pupils equal and round, sclera anicteric without injection  Ears: Left canal 100% obstructed with cerumen, right canal 40% obstructed with cerumen, TM negative Mouth: MMM, no pharyngeal exudate or erythema Lungs:  CTAB, no access msc use Cardio:  RRR, no bruits, no LE edema Psych: Age appropriate judgment and insight  Assessment:   Type 2 diabetes mellitus with hyperglycemia, without long-term current use of insulin (HCC) - Plan: Microalbumin / creatinine urine ratio, Hemoglobin A1c  Essential hypertension  Elevated blood pressure reading  Flu-like symptoms  COVID-19 -  Plan: albuterol (VENTOLIN HFA) 108 (90 Base) MCG/ACT inhaler, POCT Influenza A/B, POC COVID-19   Plan:   Chronic, uncontrolled.  He has been having constipation due to the Ozempic.  He will continue 2 mg weekly for now but if his numbers are controlled, we will change his metformin from extended release to regular to help offset the constipation.  If he is not controlled, we will stop the Ozempic and transition to Mounjaro 12.5 mg weekly and then increase after 4 weeks to 50 mg weekly.  Recommend checking blood sugar at home.  Counseled on diet and exercise.  He needs to be better with this. F/u in 3-6 mo pending above. Chronic, unsure if uncontrolled.  High today.  He checks it at home and is in the 120s/70s.  Continue medication.  He will let me know if not at goal. Flu test negative, COVID test positive.  He was exposed to his son.  Given minor symptoms, will hold off on antiviral.  Continue Tylenol and ibuprofen.  Albuterol given for breathing issues and wheezing. The patient voiced understanding and agreement to the plan.  [place], DO 05/08/22 11:59 AM

## 2022-05-09 ENCOUNTER — Telehealth: Payer: Self-pay

## 2022-05-09 LAB — MICROALBUMIN / CREATININE URINE RATIO
Creatinine,U: 213.3 mg/dL
Microalb Creat Ratio: 1.3 mg/g (ref 0.0–30.0)
Microalb, Ur: 2.8 mg/dL — ABNORMAL HIGH (ref 0.0–1.9)

## 2022-05-09 NOTE — Telephone Encounter (Signed)
Called left message to call back 

## 2022-05-09 NOTE — Telephone Encounter (Signed)
Caller Name [place] Phone Number [phone] Patient Name [name] Patient DOB 1975-07-12 Call Type Message Only Information Provided Reason for Call Request for General Office Information Initial Comment Caller states he had an appointment yesterday. He is having wheezing and congestion when he is lying down. Additional Comment Provided office hours ; caller declined triage Disp. Time Disposition Final User 05/09/2022 7:26:10 AM General Information Provided Yes Vinnie Level Call Closed By: Vinnie Level Transaction Date/Time: 05/09/2022 7:22:55 AM (ET)

## 2022-05-09 NOTE — Telephone Encounter (Signed)
Patient informed of Provider response. He agreed to do.

## 2022-05-09 NOTE — Telephone Encounter (Signed)
Yes he did use it, but had no results. He is worse at night Sinus closes up at night and when lying down, but night time is the problem.   Feeling better today.

## 2022-05-10 ENCOUNTER — Other Ambulatory Visit (HOSPITAL_BASED_OUTPATIENT_CLINIC_OR_DEPARTMENT_OTHER): Payer: Self-pay

## 2022-05-29 ENCOUNTER — Ambulatory Visit: Payer: Self-pay | Admitting: Family Medicine

## 2022-07-18 ENCOUNTER — Other Ambulatory Visit (HOSPITAL_COMMUNITY): Payer: Self-pay

## 2022-07-28 ENCOUNTER — Other Ambulatory Visit (HOSPITAL_BASED_OUTPATIENT_CLINIC_OR_DEPARTMENT_OTHER): Payer: Self-pay

## 2022-08-08 ENCOUNTER — Ambulatory Visit: Payer: 59 | Admitting: Family Medicine

## 2022-09-04 ENCOUNTER — Other Ambulatory Visit (HOSPITAL_BASED_OUTPATIENT_CLINIC_OR_DEPARTMENT_OTHER): Payer: Self-pay

## 2022-09-07 ENCOUNTER — Other Ambulatory Visit (HOSPITAL_BASED_OUTPATIENT_CLINIC_OR_DEPARTMENT_OTHER): Payer: Self-pay

## 2022-10-02 ENCOUNTER — Encounter: Payer: Self-pay | Admitting: Family Medicine

## 2022-10-02 ENCOUNTER — Other Ambulatory Visit (HOSPITAL_BASED_OUTPATIENT_CLINIC_OR_DEPARTMENT_OTHER): Payer: Self-pay

## 2022-10-02 ENCOUNTER — Ambulatory Visit (INDEPENDENT_AMBULATORY_CARE_PROVIDER_SITE_OTHER): Payer: 59 | Admitting: Family Medicine

## 2022-10-02 VITALS — BP 120/78 | HR 84 | Temp 99.0°F | Ht 71.0 in | Wt 311.2 lb

## 2022-10-02 DIAGNOSIS — Z1211 Encounter for screening for malignant neoplasm of colon: Secondary | ICD-10-CM | POA: Diagnosis not present

## 2022-10-02 DIAGNOSIS — I10 Essential (primary) hypertension: Secondary | ICD-10-CM | POA: Diagnosis not present

## 2022-10-02 DIAGNOSIS — B353 Tinea pedis: Secondary | ICD-10-CM | POA: Diagnosis not present

## 2022-10-02 DIAGNOSIS — E1165 Type 2 diabetes mellitus with hyperglycemia: Secondary | ICD-10-CM

## 2022-10-02 DIAGNOSIS — E039 Hypothyroidism, unspecified: Secondary | ICD-10-CM | POA: Diagnosis not present

## 2022-10-02 MED ORDER — KETOCONAZOLE 2 % EX CREA
1.0000 | TOPICAL_CREAM | Freq: Every day | CUTANEOUS | 0 refills | Status: AC
  Filled 2022-10-02: qty 30, 30d supply, fill #0

## 2022-10-02 MED ORDER — METFORMIN HCL ER 500 MG PO TB24
1000.0000 mg | ORAL_TABLET | Freq: Every day | ORAL | 3 refills | Status: DC
Start: 2022-10-02 — End: 2023-09-21
  Filled 2022-10-02: qty 180, 90d supply, fill #0

## 2022-10-02 NOTE — Patient Instructions (Addendum)
Keep the diet clean and stay active.  Give [place] 2-3 business days to get the results of your labs back.   Try to drink 55-60 oz of water daily outside of exercise.  For the muscle cramping, drink lots of fluids. Also take a spoonful of pickle juice nightly. An alternative would be a teaspoon of mustard, but most people prefer pickle juice.   Let us know if you need anything.

## 2022-10-02 NOTE — Progress Notes (Signed)
Subjective:   Chief Complaint  Patient presents with   Follow-up    [name] is a 47 y.o. male here for follow-up of diabetes.   [name] does not routinely monitor his sugars.  Patient does not require insulin.   Medications include: metformin XR 1000 mg daily, Mounjaro 15 mg weekly Diet is OK.  Exercise: active at work  Hypertension Patient presents for hypertension follow up. He does monitor home blood pressures. Blood pressures ranging on average from 130's/80's. He is compliant with medications-Benicar 40 mg daily, Norvasc 10 mg daily, hydrochlorothiazide 25 mg daily. Patient has these side effects of medication: none Diet/exercise as above. No chest pain or shortness of breath.  Past Medical History:  Diagnosis Date   Anxiety    Depression    Diabetes mellitus without complication (HCC)    Hyperlipidemia    Hypertension    Hypothyroidism    Obesity    OSA (obstructive sleep apnea)      Related testing: Retinal exam: Done Pneumovax: done  Objective:  BP 120/78 (BP Location: Left Arm, Patient Position: Sitting, Cuff Size: Large)   Pulse 84   Temp 99 F (37.2 C) (Oral)   Ht 5\' 11"  (1.803 m)   Wt (!) 311 lb 4 oz (141.2 kg)   SpO2 95%   BMI 43.41 kg/m  General:  Well developed, well nourished, in no apparent distress Skin:  macerated tissue between toes; otherwise warm, no pallor or diaphoresis Head:  Normocephalic, atraumatic Eyes:  Pupils equal and round, sclera anicteric without injection  Lungs:  CTAB, no access msc use Cardio:  RRR, no bruits, no LE edema Musculoskeletal:  Symmetrical muscle groups noted without atrophy or deformity Neuro:  Sensation intact to pinprick on feet Psych: Age appropriate judgment and insight  Assessment:   Type 2 diabetes mellitus with hyperglycemia, without long-term current use of insulin (HCC) - Plan: Hemoglobin A1c, Microalbumin / creatinine urine ratio, metFORMIN (GLUCOPHAGE XR) 500 MG 24 hr tablet  Essential  hypertension  Acquired hypothyroidism - Plan: TSH, T4, free  Tinea pedis of both feet - Plan: ketoconazole (NIZORAL) 2 % cream  Screen for colon cancer - Plan: Ambulatory referral to Gastroenterology   Plan:   Chronic, hopefully stable. Counseled on diet and exercise. Cont Mounjaro 15 mg/week, Metformin XR 1000 mg/d. Chronic, stable. Cont hydrochlorothiazide 25 mg/d, Norvasc 10 mg/d, olmesartan 40 mg/d.  Ck levels, has not been on Synthroid.  6 weeks of above.  Refer GI as he is turning 47 soon.  F/u in 3-6 mo. The patient voiced understanding and agreement to the plan.  Jilda Roche Coleman, DO 10/02/22 4:27 PM

## 2022-10-03 ENCOUNTER — Other Ambulatory Visit (HOSPITAL_BASED_OUTPATIENT_CLINIC_OR_DEPARTMENT_OTHER): Payer: Self-pay

## 2022-10-03 ENCOUNTER — Other Ambulatory Visit: Payer: Self-pay | Admitting: Family Medicine

## 2022-10-03 LAB — T4, FREE: Free T4: 0.68 ng/dL (ref 0.60–1.60)

## 2022-10-03 LAB — MICROALBUMIN / CREATININE URINE RATIO
Creatinine,U: 191.4 mg/dL
Microalb Creat Ratio: 0.4 mg/g (ref 0.0–30.0)
Microalb, Ur: 0.8 mg/dL (ref 0.0–1.9)

## 2022-10-03 LAB — TSH: TSH: 3.09 u[IU]/mL (ref 0.35–5.50)

## 2022-10-03 LAB — HEMOGLOBIN A1C: Hgb A1c MFr Bld: 7.9 % — ABNORMAL HIGH (ref 4.6–6.5)

## 2022-10-03 MED ORDER — PIOGLITAZONE HCL 30 MG PO TABS
30.0000 mg | ORAL_TABLET | Freq: Every day | ORAL | 3 refills | Status: DC
  Filled 2022-10-03: qty 30, 30d supply, fill #0

## 2022-10-17 ENCOUNTER — Other Ambulatory Visit (HOSPITAL_BASED_OUTPATIENT_CLINIC_OR_DEPARTMENT_OTHER): Payer: Self-pay

## 2022-10-20 ENCOUNTER — Other Ambulatory Visit: Payer: Self-pay

## 2022-10-20 ENCOUNTER — Ambulatory Visit (INDEPENDENT_AMBULATORY_CARE_PROVIDER_SITE_OTHER): Payer: 59 | Admitting: Family Medicine

## 2022-10-20 ENCOUNTER — Other Ambulatory Visit: Payer: Self-pay | Admitting: Family Medicine

## 2022-10-20 ENCOUNTER — Other Ambulatory Visit (HOSPITAL_BASED_OUTPATIENT_CLINIC_OR_DEPARTMENT_OTHER): Payer: Self-pay

## 2022-10-20 ENCOUNTER — Encounter: Payer: Self-pay | Admitting: Family Medicine

## 2022-10-20 VITALS — BP 135/88 | HR 90 | Temp 98.6°F | Ht 72.0 in | Wt 310.1 lb

## 2022-10-20 DIAGNOSIS — F419 Anxiety disorder, unspecified: Secondary | ICD-10-CM

## 2022-10-20 DIAGNOSIS — Z0000 Encounter for general adult medical examination without abnormal findings: Secondary | ICD-10-CM | POA: Diagnosis not present

## 2022-10-20 DIAGNOSIS — F32A Depression, unspecified: Secondary | ICD-10-CM | POA: Diagnosis not present

## 2022-10-20 DIAGNOSIS — Z1211 Encounter for screening for malignant neoplasm of colon: Secondary | ICD-10-CM

## 2022-10-20 DIAGNOSIS — E871 Hypo-osmolality and hyponatremia: Secondary | ICD-10-CM

## 2022-10-20 HISTORY — DX: Morbid (severe) obesity due to excess calories: E66.01

## 2022-10-20 LAB — COMPREHENSIVE METABOLIC PANEL
ALT: 23 U/L (ref 0–53)
AST: 17 U/L (ref 0–37)
Albumin: 4.4 g/dL (ref 3.5–5.2)
Alkaline Phosphatase: 57 U/L (ref 39–117)
BUN: 18 mg/dL (ref 6–23)
CO2: 30 mEq/L (ref 19–32)
Calcium: 9.2 mg/dL (ref 8.4–10.5)
Chloride: 95 mEq/L — ABNORMAL LOW (ref 96–112)
Creatinine, Ser: 1.13 mg/dL (ref 0.40–1.50)
GFR: 78.84 mL/min (ref >60.00)
Glucose, Bld: 113 mg/dL — ABNORMAL HIGH (ref 70–99)
Potassium: 3.6 mEq/L (ref 3.5–5.1)
Sodium: 132 mEq/L — ABNORMAL LOW (ref 135–145)
Total Bilirubin: 0.4 mg/dL (ref 0.2–1.2)
Total Protein: 7.3 g/dL (ref 6.0–8.3)

## 2022-10-20 LAB — LIPID PANEL
Cholesterol: 209 mg/dL — ABNORMAL HIGH (ref 0–200)
HDL: 32.8 mg/dL — ABNORMAL LOW (ref >39.00)
LDL Cholesterol: 144 mg/dL — ABNORMAL HIGH (ref 0–99)
NonHDL: 176.59
Total CHOL/HDL Ratio: 6
Triglycerides: 161 mg/dL — ABNORMAL HIGH (ref 0.0–149.0)
VLDL: 32.2 mg/dL (ref 0.0–40.0)

## 2022-10-20 MED ORDER — CITALOPRAM HYDROBROMIDE 20 MG PO TABS
20.0000 mg | ORAL_TABLET | Freq: Every day | ORAL | 3 refills | Status: DC
Start: 2022-10-20 — End: 2024-01-30
  Filled 2022-10-20: qty 90, 90d supply, fill #0
  Filled 2023-02-05: qty 90, 90d supply, fill #1
  Filled 2023-05-28: qty 90, 90d supply, fill #2
  Filled 2023-09-10: qty 90, 90d supply, fill #3

## 2022-10-20 NOTE — Progress Notes (Signed)
Chief Complaint  Patient presents with   Annual Exam    Well Male [name] is here for a complete physical.   His last physical was >1 year ago.  Current diet: in general, a "healthy" diet.   Current exercise: active at work Weight trend: starting to lose Fatigue out of ordinary? No. Seat belt? Yes.   Advanced directive? No  Health maintenance Tetanus- Yes HIV- Yes Hep C- Yes  Past Medical History:  Diagnosis Date   Anxiety    Depression    Diabetes mellitus without complication (HCC)    Hyperlipidemia    Hypertension    Hypothyroidism    Obesity    OSA (obstructive sleep apnea)      Past Surgical History:  Procedure Laterality Date   FRACTURE SURGERY     right arch   HERNIA REPAIR  1990   Right ingruial & umbilical [place]    LEFT HEART CATHETERIZATION WITH CORONARY ANGIOGRAM N/A 09/26/2011   Procedure: LEFT HEART CATHETERIZATION WITH CORONARY ANGIOGRAM;  Surgeon: Pamella Pert, MD;  Location: [facility] CATH LAB;  Service: Cardiovascular;  Laterality: N/A;   Repair Right Arm Fracture  1996   [place]    UVULOPALATOPHARYNGOPLASTY (UPPP)/TONSILLECTOMY/SEPTOPLASTY      Medications  Current Outpatient Medications on File Prior to Visit  Medication Sig Dispense Refill   albuterol (VENTOLIN HFA) 108 (90 Base) MCG/ACT inhaler Inhale 2 puffs into the lungs every 6 (six) hours as needed for wheezing or shortness of breath. 6.7 g 0   amLODipine (NORVASC) 10 MG tablet Take 1 tablet (10 mg total) by mouth daily. 90 tablet 3   aspirin EC 81 MG tablet Take 81 mg by mouth. Only takes occasionally.     fluticasone (FLONASE) 50 MCG/ACT nasal spray PLACE 1 SPRAY INTO BOTH NOSTRILS 2 TIMES DAILY 16 g 6   hydrochlorothiazide (HYDRODIURIL) 25 MG tablet Take 1 tablet (25 mg total) by mouth daily. 90 tablet 3   ketoconazole (NIZORAL) 2 % cream Apply 1 Application topically daily. 30 g 0   levocetirizine (XYZAL) 5 MG tablet Take 1 tablet (5 mg total) by mouth every evening. 30  tablet 2   levothyroxine (SYNTHROID) 88 MCG tablet TAKE 1 TABLET BY MOUTH ONCE DAILY 90 tablet 2   meloxicam (MOBIC) 15 MG tablet Take 1 tablet (15 mg total) by mouth daily. 30 tablet 0   metFORMIN (GLUCOPHAGE-XR) 500 MG 24 hr tablet Take 2 tablets (1,000 mg total) by mouth daily with breakfast. 180 tablet 3   montelukast (SINGULAIR) 10 MG tablet Take 1 tablet (10 mg total) by mouth at bedtime. 30 tablet 3   olmesartan (BENICAR) 40 MG tablet Take 1 tablet (40 mg total) by mouth daily. 90 tablet 3   rosuvastatin (CRESTOR) 40 MG tablet Take 1 tablet (40 mg total) by mouth daily. 90 tablet 1   tirzepatide (MOUNJARO) 15 MG/0.5ML Pen Inject 15 mg into the skin once a week. 2 mL 5   Allergies Allergies  Allergen Reactions   Atorvastatin Other (See Comments)    Elevated liver enzymes   Lisinopril Cough   Cymbalta [Duloxetine Hcl] Rash    Family History Family History  Problem Relation Age of Onset   Hypertension Mother    Hypertension Father    Heart attack Father    Diabetes Mellitus II Father    Down syndrome Brother    Kidney disease Maternal Grandfather    Breast cancer Paternal Grandmother    Diabetes Paternal Grandmother  Review of Systems: Constitutional: no fevers or chills Eye:  no recent significant change in vision Ear/Nose/Mouth/Throat:  Ears:  no hearing loss Nose/Mouth/Throat:  no complaints of nasal congestion, no sore throat Cardiovascular:  no chest pain Respiratory:  no shortness of breath Gastrointestinal:  no abdominal pain, no change in bowel habits GU:  Male: negative for dysuria, frequency, and incontinence Musculoskeletal/Extremities:  no pain of the joints Integumentary (Skin/Breast):  no abnormal skin lesions reported Neurologic:  no headaches Endocrine: No unexpected weight changes Hematologic/Lymphatic:  no night sweats  Exam BP 135/88 (BP Location: Left Arm, Patient Position: Sitting, Cuff Size: Large)   Pulse 90   Temp 98.6 F (37 C) (Oral)    Ht 6' (1.829 m)   Wt (!) 310 lb 2 oz (140.7 kg)   SpO2 99%   BMI 42.06 kg/m  General:  well developed, well nourished, in no apparent distress Skin:  no significant moles, warts, or growths Head:  no masses, lesions, or tenderness Eyes:  pupils equal and round, sclera anicteric without injection Ears:  canals without lesions, TMs shiny without retraction, no obvious effusion, no erythema Nose:  nares patent, mucosa normal Throat/Pharynx:  lips and gingiva without lesion; tongue and uvula midline; non-inflamed pharynx; no exudates or postnasal drainage Neck: neck supple without adenopathy, thyromegaly, or masses Lungs:  clear to auscultation, breath sounds equal bilaterally, no respiratory distress Cardio:  regular rate and rhythm, no bruits, no LE edema Abdomen:  abdomen soft, nontender; bowel sounds normal; no masses or organomegaly Rectal: Deferred Musculoskeletal:  symmetrical muscle groups noted without atrophy or deformity Extremities:  no clubbing, cyanosis, or edema, no deformities, no skin discoloration Neuro:  gait normal; deep tendon reflexes normal and symmetric Psych: well oriented with normal range of affect and appropriate judgment/insight  Assessment and Plan  Well adult exam - Plan: Comprehensive metabolic panel, Lipid panel  Anxiety and depression - Plan: citalopram (CELEXA) 20 MG tablet  Screen for colon cancer - Plan: Ambulatory referral to Gastroenterology   Well 47 y.o. male. Counseled on diet and exercise. Counseled on risks and benefits of prostate cancer screening with PSA. The patient agrees to forego screening.  CCS: Refer to GI, turning 47 soon. Other orders as above. Follow up as originally scheduled. The patient voiced understanding and agreement to the plan.  Jilda Roche Vayas, DO 10/20/22 9:52 AM

## 2022-10-20 NOTE — Patient Instructions (Addendum)
Give [place] 2-3 business days to get the results of your labs back.   Keep the diet clean and stay active.  Aim to do some physical exertion for 150 minutes per week. This is typically divided into 5 days per week, 30 minutes per day. The activity should be enough to get your heart rate up. Anything is better than nothing if you have time constraints.  If you do not hear anything about your referral in the next 1-2 weeks, call our office and ask for an update.  Please get me a copy of your advanced directive form at your convenience.   I recommend getting the flu shot in mid October. This suggestion would change if the CDC comes out with a different recommendation.   Let us know if you need anything.

## 2022-10-26 ENCOUNTER — Other Ambulatory Visit (INDEPENDENT_AMBULATORY_CARE_PROVIDER_SITE_OTHER): Payer: 59

## 2022-10-26 ENCOUNTER — Other Ambulatory Visit (HOSPITAL_BASED_OUTPATIENT_CLINIC_OR_DEPARTMENT_OTHER): Payer: Self-pay

## 2022-10-26 DIAGNOSIS — E871 Hypo-osmolality and hyponatremia: Secondary | ICD-10-CM

## 2022-10-26 LAB — BASIC METABOLIC PANEL
BUN: 19 mg/dL (ref 6–23)
CO2: 31 mEq/L (ref 19–32)
Calcium: 9.5 mg/dL (ref 8.4–10.5)
Chloride: 98 mEq/L (ref 96–112)
Creatinine, Ser: 1.05 mg/dL (ref 0.40–1.50)
GFR: 86.09 mL/min (ref >60.00)
Glucose, Bld: 90 mg/dL (ref 70–99)
Potassium: 3.6 mEq/L (ref 3.5–5.1)
Sodium: 136 mEq/L (ref 135–145)

## 2022-12-15 ENCOUNTER — Other Ambulatory Visit (HOSPITAL_BASED_OUTPATIENT_CLINIC_OR_DEPARTMENT_OTHER): Payer: Self-pay

## 2023-01-03 ENCOUNTER — Ambulatory Visit: Payer: 59 | Admitting: Family Medicine

## 2023-01-16 ENCOUNTER — Ambulatory Visit (AMBULATORY_SURGERY_CENTER): Payer: 59

## 2023-01-16 ENCOUNTER — Other Ambulatory Visit (HOSPITAL_BASED_OUTPATIENT_CLINIC_OR_DEPARTMENT_OTHER): Payer: Self-pay

## 2023-01-16 VITALS — Ht 72.0 in | Wt 306.0 lb

## 2023-01-16 DIAGNOSIS — Z1211 Encounter for screening for malignant neoplasm of colon: Secondary | ICD-10-CM

## 2023-01-16 MED ORDER — NA SULFATE-K SULFATE-MG SULF 17.5-3.13-1.6 GM/177ML PO SOLN
1.0000 | Freq: Once | ORAL | 0 refills | Status: AC
Start: 2023-01-16 — End: 2023-01-18
  Filled 2023-01-16: qty 354, 2d supply, fill #0

## 2023-01-16 NOTE — Progress Notes (Signed)
No egg or soy allergy known to patient   No issues known to pt with past sedation with any surgeries or procedures  Patient denies ever being told they had issues or difficulty with intubation   No FH of Malignant Hyperthermia  Pt is not on diet pills  Pt is not on  home 02   Pt is not on blood thinners   Pt denies issues with constipation   No A fib or A flutter  Have any cardiac testing pending--no Pt instructed to use Singlecare[employer]com or GoodRx for a price reduction on prep     Patient's chart reviewed by John Nulty CRNA prior to previsit and patient appropriate for the [facility].  Previsit completed and red dot placed by patient's name on their procedure day (on provider's schedule).    

## 2023-01-17 ENCOUNTER — Other Ambulatory Visit (HOSPITAL_BASED_OUTPATIENT_CLINIC_OR_DEPARTMENT_OTHER): Payer: Self-pay

## 2023-01-17 ENCOUNTER — Encounter: Payer: Self-pay | Admitting: Gastroenterology

## 2023-01-28 ENCOUNTER — Encounter: Payer: Self-pay | Admitting: Certified Registered Nurse Anesthetist

## 2023-02-01 ENCOUNTER — Encounter: Payer: Self-pay | Admitting: Gastroenterology

## 2023-02-01 ENCOUNTER — Ambulatory Visit: Payer: 59 | Admitting: Gastroenterology

## 2023-02-01 VITALS — BP 144/83 | HR 76 | Temp 96.2°F | Resp 10 | Ht 72.0 in | Wt 306.0 lb

## 2023-02-01 DIAGNOSIS — E039 Hypothyroidism, unspecified: Secondary | ICD-10-CM | POA: Diagnosis not present

## 2023-02-01 DIAGNOSIS — Z1211 Encounter for screening for malignant neoplasm of colon: Secondary | ICD-10-CM | POA: Diagnosis not present

## 2023-02-01 DIAGNOSIS — F32A Depression, unspecified: Secondary | ICD-10-CM | POA: Diagnosis not present

## 2023-02-01 DIAGNOSIS — Q438 Other specified congenital malformations of intestine: Secondary | ICD-10-CM

## 2023-02-01 DIAGNOSIS — K648 Other hemorrhoids: Secondary | ICD-10-CM

## 2023-02-01 DIAGNOSIS — E119 Type 2 diabetes mellitus without complications: Secondary | ICD-10-CM | POA: Diagnosis not present

## 2023-02-01 DIAGNOSIS — K635 Polyp of colon: Secondary | ICD-10-CM | POA: Diagnosis not present

## 2023-02-01 DIAGNOSIS — F419 Anxiety disorder, unspecified: Secondary | ICD-10-CM | POA: Diagnosis not present

## 2023-02-01 DIAGNOSIS — D123 Benign neoplasm of transverse colon: Secondary | ICD-10-CM

## 2023-02-01 DIAGNOSIS — G4733 Obstructive sleep apnea (adult) (pediatric): Secondary | ICD-10-CM | POA: Diagnosis not present

## 2023-02-01 MED ORDER — SODIUM CHLORIDE 0.9 % IV SOLN: 500.0000 mL | Freq: Once | INTRAVENOUS | Status: DC

## 2023-02-01 NOTE — Progress Notes (Signed)
Pt's states no medical or surgical changes since previsit or office visit. 

## 2023-02-01 NOTE — Op Note (Signed)
[facility] Patient Name: [name] Procedure Date: 02/01/2023 4:13 PM MRN: [id number] Endoscopist: Sherilyn Cooter L. Myrtie Neither , MD, [id number] Age: 47 Referring MD:  Date of Birth: 05/01/1975 Gender: Male Account #: [id number] Procedure:                Colonoscopy Indications:              Screening for colorectal malignant neoplasm, This                            is the patient's first colonoscopy Medicines:                Monitored Anesthesia Care Procedure:                Pre-Anesthesia Assessment:                           - Prior to the procedure, a History and Physical                            was performed, and patient medications and                            allergies were reviewed. The patient's tolerance of                            previous anesthesia was also reviewed. The risks                            and benefits of the procedure and the sedation                            options and risks were discussed with the patient.                            All questions were answered, and informed consent                            was obtained. Prior Anticoagulants: The patient has                            taken no anticoagulant or antiplatelet agents. ASA                            Grade Assessment: III - A patient with severe                            systemic disease. After reviewing the risks and                            benefits, the patient was deemed in satisfactory                            condition to undergo the procedure.  After obtaining informed consent, the colonoscope                            was passed under direct vision. Throughout the                            procedure, the patient's blood pressure, pulse, and                            oxygen saturations were monitored continuously. The                            Olympus Scope SN: [id number] was introduced through                            the anus and advanced  to the the cecum, identified                            by appendiceal orifice and ileocecal valve. The                            colonoscopy was extremely difficult due to a                            redundant colon, significant looping and the                            patient's body habitus. Successful completion of                            the procedure was aided by using manual pressure                            and straightening and shortening the scope to                            obtain bowel loop reduction. The patient tolerated                            the procedure well. The quality of the bowel                            preparation was excellent. The ileocecal valve,                            appendiceal orifice, and rectum were photographed. Scope In: 4:17:24 PM Scope Out: 4:38:46 PM Scope Withdrawal Time: 0 hours 9 minutes 33 seconds  Total Procedure Duration: 0 hours 21 minutes 22 seconds  Findings:                 The perianal and digital rectal examinations were                            normal.  A diminutive polyp was found in the transverse                            colon. The polyp was semi-sessile. The polyp was                            removed with a cold snare. Resection and retrieval                            were complete.                           The colon (entire examined portion) was                            significantly redundant.                           Internal hemorrhoids were found.                           The exam was otherwise without abnormality on                            direct and retroflexion views. Complications:            No immediate complications. Estimated Blood Loss:     Estimated blood loss was minimal. Impression:               - One diminutive polyp in the transverse colon,                            removed with a cold snare. Resected and retrieved.                           - Redundant  colon.                           - Internal hemorrhoids.                           - The examination was otherwise normal on direct                            and retroflexion views. Recommendation:           - Patient has a contact number available for                            emergencies. The signs and symptoms of potential                            delayed complications were discussed with the                            patient. Return to normal activities tomorrow.  Written discharge instructions were provided to the                            patient.                           - Resume previous diet.                           - Continue present medications.                           - Await pathology results.                           - Repeat colonoscopy is recommended for                            surveillance. The colonoscopy date will be                            determined after pathology results from today's                            exam become available for review. [place] Doig L. Myrtie Neither, MD 02/01/2023 4:42:42 PM This report has been signed electronically.

## 2023-02-01 NOTE — Progress Notes (Signed)
Called to room to assist during endoscopic procedure.  Patient ID and intended procedure confirmed with present staff. Received instructions for my participation in the procedure from the performing physician.  

## 2023-02-01 NOTE — Progress Notes (Signed)
Report given to PACU, vss 

## 2023-02-01 NOTE — Progress Notes (Signed)
History and Physical:  This patient presents for endoscopic testing for: Encounter Diagnosis  Name Primary?   Special screening for malignant neoplasms, colon Yes    Average risk for colorectal cancer.  First screening exam.  Patient denies chronic abdominal pain, rectal bleeding, constipation or diarrhea.   Patient is otherwise without complaints or active issues today.   Past Medical History: Past Medical History:  Diagnosis Date   Allergy    Anxiety    Depression    Diabetes mellitus without complication (HCC)    Hyperlipidemia    Hypertension    Hypothyroidism    Morbid (severe) obesity due to excess calories (HCC) 10/20/2022   bmi 42.06   Obesity    OSA (obstructive sleep apnea)    uses cpap   Sleep apnea      Past Surgical History: Past Surgical History:  Procedure Laterality Date   HERNIA REPAIR  03/13/1988   Right ingruial & umbilical [place]    LEFT HEART CATHETERIZATION WITH CORONARY ANGIOGRAM N/A 09/26/2011   Procedure: LEFT HEART CATHETERIZATION WITH CORONARY ANGIOGRAM;  Surgeon: Pamella Pert, MD;  Location: [facility] CATH LAB;  Service: Cardiovascular;  Laterality: N/A;   Repair Right Arm Fracture  03/13/1994   [place]    UVULOPALATOPHARYNGOPLASTY (UPPP)/TONSILLECTOMY/SEPTOPLASTY  2010    Allergies: Allergies  Allergen Reactions   Atorvastatin Other (See Comments)    Elevated liver enzymes   Lisinopril Cough   Cymbalta [Duloxetine Hcl] Rash    Outpatient Meds: Current Outpatient Medications  Medication Sig Dispense Refill   amLODipine (NORVASC) 10 MG tablet Take 1 tablet (10 mg total) by mouth daily. 90 tablet 3   citalopram (CELEXA) 20 MG tablet Take 1 tablet (20 mg total) by mouth daily. 90 tablet 3   fluticasone (FLONASE) 50 MCG/ACT nasal spray PLACE 1 SPRAY INTO BOTH NOSTRILS 2 TIMES DAILY 16 g 6   levocetirizine (XYZAL) 5 MG tablet Take 1 tablet (5 mg total) by mouth every evening. 30 tablet 2   olmesartan (BENICAR) 40 MG tablet Take 1  tablet (40 mg total) by mouth daily. 90 tablet 3   rosuvastatin (CRESTOR) 40 MG tablet Take 1 tablet (40 mg total) by mouth daily. 90 tablet 1   albuterol (VENTOLIN HFA) 108 (90 Base) MCG/ACT inhaler Inhale 2 puffs into the lungs every 6 (six) hours as needed for wheezing or shortness of breath. 6.7 g 0   aspirin EC 81 MG tablet Take 81 mg by mouth. Only takes occasionally.     hydrochlorothiazide (HYDRODIURIL) 25 MG tablet Take 1 tablet (25 mg total) by mouth daily. 90 tablet 3   levothyroxine (SYNTHROID) 88 MCG tablet TAKE 1 TABLET BY MOUTH ONCE DAILY 90 tablet 2   meloxicam (MOBIC) 15 MG tablet Take 1 tablet (15 mg total) by mouth daily. 30 tablet 0   metFORMIN (GLUCOPHAGE-XR) 500 MG 24 hr tablet Take 2 tablets (1,000 mg total) by mouth daily with breakfast. 180 tablet 3   montelukast (SINGULAIR) 10 MG tablet Take 1 tablet (10 mg total) by mouth at bedtime. (Patient not taking: Reported on 02/01/2023) 30 tablet 3   tirzepatide (MOUNJARO) 15 MG/0.5ML Pen Inject 15 mg into the skin once a week. 2 mL 5   Current Facility-Administered Medications  Medication Dose Route Frequency Provider Last Rate Last Admin   0.9 %  sodium chloride infusion  500 mL Intravenous Once Sherrilyn Rist, MD          ___________________________________________________________________ Objective   Exam:  BP Marland Kitchen)  140/88   Pulse 79   Temp (!) 96.2 F (35.7 C) (Temporal)   Ht 6' (1.829 m)   Wt (!) 306 lb (138.8 kg)   SpO2 99%   BMI 41.50 kg/m   CV: regular , S1/S2 Resp: clear to auscultation bilaterally, normal RR and effort noted GI: soft, no tenderness, with active bowel sounds.   Assessment: Encounter Diagnosis  Name Primary?   Special screening for malignant neoplasms, colon Yes     Plan: Colonoscopy   The benefits and risks of the planned procedure were described in detail with the patient or (when appropriate) their health care proxy.  Risks were outlined as including, but not limited to,  bleeding, infection, perforation, adverse medication reaction leading to cardiac or pulmonary decompensation, pancreatitis (if ERCP).  The limitation of incomplete mucosal visualization was also discussed.  No guarantees or warranties were given.  The patient is appropriate for an endoscopic procedure in the ambulatory setting.   - Frank Jupiter, MD

## 2023-02-01 NOTE — Patient Instructions (Signed)

## 2023-02-02 ENCOUNTER — Telehealth: Payer: Self-pay

## 2023-02-02 NOTE — Telephone Encounter (Signed)
  Follow up Call-     02/01/2023    3:09 PM  Call back number  Post procedure Call Back phone  # [phone]  Permission to leave phone message Yes     Patient questions:  Do you have a fever, pain , or abdominal swelling? No. Pain Score  0 *  Have you tolerated food without any problems? Yes.    Have you been able to return to your normal activities? Yes.    Do you have any questions about your discharge instructions: Diet   No. Medications  No. Follow up visit  No.  Do you have questions or concerns about your Care? No.  Actions: * If pain score is 4 or above: No action needed, pain <4.

## 2023-02-05 ENCOUNTER — Other Ambulatory Visit (HOSPITAL_BASED_OUTPATIENT_CLINIC_OR_DEPARTMENT_OTHER): Payer: Self-pay

## 2023-02-06 LAB — SURGICAL PATHOLOGY

## 2023-02-07 ENCOUNTER — Encounter: Payer: Self-pay | Admitting: Gastroenterology

## 2023-02-13 ENCOUNTER — Ambulatory Visit (INDEPENDENT_AMBULATORY_CARE_PROVIDER_SITE_OTHER): Payer: 59 | Admitting: Family Medicine

## 2023-02-13 ENCOUNTER — Encounter: Payer: Self-pay | Admitting: Family Medicine

## 2023-02-13 VITALS — BP 134/86 | HR 90 | Temp 98.0°F | Resp 16 | Ht 73.0 in | Wt 307.0 lb

## 2023-02-13 DIAGNOSIS — E1165 Type 2 diabetes mellitus with hyperglycemia: Secondary | ICD-10-CM | POA: Diagnosis not present

## 2023-02-13 DIAGNOSIS — E782 Mixed hyperlipidemia: Secondary | ICD-10-CM | POA: Diagnosis not present

## 2023-02-13 DIAGNOSIS — Z7985 Long-term (current) use of injectable non-insulin antidiabetic drugs: Secondary | ICD-10-CM | POA: Diagnosis not present

## 2023-02-13 DIAGNOSIS — Z7984 Long term (current) use of oral hypoglycemic drugs: Secondary | ICD-10-CM

## 2023-02-13 DIAGNOSIS — Z23 Encounter for immunization: Secondary | ICD-10-CM

## 2023-02-13 NOTE — Progress Notes (Signed)
Subjective:   Chief Complaint  Patient presents with   Follow-up    Follow up    [name] is a 47 y.o. male here for follow-up of diabetes.   [name] does not routinely check his sugars.  Patient does not require insulin.   Medications include: Mounjaro 15 mg/week, Metformin XR 1000 mg/d Diet is OK. Improving.  Exercise: active at work; walking  Mixed Hyperlipidemia Patient presents for mixed hyperlipidemia follow up. Currently being treated with Crestor 40 mg/d and compliance with treatment thus far has been good. He denies myalgias. Diet/exercise as above.  No Cp or SOB.  The patient is not known to have coexisting coronary artery disease.  Past Medical History:  Diagnosis Date   Allergy    Anxiety    Depression    Diabetes mellitus without complication (HCC)    Hyperlipidemia    Hypertension    Hypothyroidism    Morbid (severe) obesity due to excess calories (HCC) 10/20/2022   bmi 42.06   Obesity    OSA (obstructive sleep apnea)    uses cpap   Sleep apnea      Related testing: Retinal exam: Done Pneumovax: done  Objective:  BP 134/86 (BP Location: Left Arm, Patient Position: Sitting, Cuff Size: Normal)   Pulse 90   Temp 98 F (36.7 C) (Oral)   Resp 16   Ht 6\' 1"  (1.854 m)   Wt (!) 307 lb (139.3 kg)   SpO2 96%   BMI 40.50 kg/m  General:  Well developed, well nourished, in no apparent distress Lungs:  CTAB, no access msc use Cardio:  RRR, no bruits, no LE edema Psych: Age appropriate judgment and insight  Assessment:   Type 2 diabetes mellitus with hyperglycemia, without long-term current use of insulin (HCC) - Plan: Comprehensive metabolic panel, Lipid panel, Hemoglobin A1c  Mixed hyperlipidemia   Plan:   Chronic, hopefully stable.  Check labs as above.  Continue Mounjaro 15 mg weekly, metformin XR 1000 mg daily.  He had adverse effects with Farxiga-could consider Jardiance versus pioglitazone.  Counseled on diet and exercise. Chronic,  unsure if stable.  Check above, if not controlled will continue Crestor 40 mg daily and add fenofibrate 48 mg daily. Flu shot today.  F/u in 3-6 mo pending the above. The patient voiced understanding and agreement to the plan.  Frank [place], DO 02/13/23 4:43 PM

## 2023-02-13 NOTE — Patient Instructions (Addendum)
Give [place] 2-3 business days to get the results of your labs back. Our follow up will be based on your results.   Keep the diet clean and stay active.  Aim to do some physical exertion for 150 minutes per week. This is typically divided into 5 days per week, 30 minutes per day. The activity should be enough to get your heart rate up. Anything is better than nothing if you have time constraints.  Let us know if you need anything.

## 2023-02-14 LAB — COMPREHENSIVE METABOLIC PANEL
ALT: 37 U/L (ref 0–53)
AST: 20 U/L (ref 0–37)
Albumin: 4.4 g/dL (ref 3.5–5.2)
Alkaline Phosphatase: 56 U/L (ref 39–117)
BUN: 22 mg/dL (ref 6–23)
CO2: 32 meq/L (ref 19–32)
Calcium: 9.4 mg/dL (ref 8.4–10.5)
Chloride: 100 meq/L (ref 96–112)
Creatinine, Ser: 1.11 mg/dL (ref 0.40–1.50)
GFR: 80.36 mL/min (ref >60.00)
Glucose, Bld: 112 mg/dL — ABNORMAL HIGH (ref 70–99)
Potassium: 3.9 meq/L (ref 3.5–5.1)
Sodium: 140 meq/L (ref 135–145)
Total Bilirubin: 0.3 mg/dL (ref 0.2–1.2)
Total Protein: 7.8 g/dL (ref 6.0–8.3)

## 2023-02-14 LAB — LIPID PANEL
Cholesterol: 172 mg/dL (ref 0–200)
HDL: 40.7 mg/dL (ref >39.00)
LDL Cholesterol: 118 mg/dL — ABNORMAL HIGH (ref 0–99)
NonHDL: 130.81
Total CHOL/HDL Ratio: 4
Triglycerides: 62 mg/dL (ref 0.0–149.0)
VLDL: 12.4 mg/dL (ref 0.0–40.0)

## 2023-02-14 LAB — HEMOGLOBIN A1C: Hgb A1c MFr Bld: 7.8 % — ABNORMAL HIGH (ref 4.6–6.5)

## 2023-04-02 ENCOUNTER — Other Ambulatory Visit (HOSPITAL_BASED_OUTPATIENT_CLINIC_OR_DEPARTMENT_OTHER): Payer: Self-pay

## 2023-04-02 ENCOUNTER — Other Ambulatory Visit: Payer: Self-pay | Admitting: Family Medicine

## 2023-04-02 MED ORDER — OLMESARTAN MEDOXOMIL 40 MG PO TABS
40.0000 mg | ORAL_TABLET | Freq: Every day | ORAL | 3 refills | Status: DC
  Filled 2023-04-02 – 2023-05-28 (×2): qty 90, 90d supply, fill #0

## 2023-04-02 MED ORDER — AMLODIPINE BESYLATE 10 MG PO TABS
10.0000 mg | ORAL_TABLET | Freq: Every day | ORAL | 3 refills | Status: DC
  Filled 2023-04-02: qty 90, 90d supply, fill #0
  Filled 2023-08-07: qty 90, 90d supply, fill #1

## 2023-04-03 ENCOUNTER — Other Ambulatory Visit: Payer: Self-pay | Admitting: Family Medicine

## 2023-04-03 ENCOUNTER — Other Ambulatory Visit: Payer: Self-pay

## 2023-04-03 ENCOUNTER — Other Ambulatory Visit (HOSPITAL_BASED_OUTPATIENT_CLINIC_OR_DEPARTMENT_OTHER): Payer: Self-pay

## 2023-04-03 ENCOUNTER — Telehealth: Payer: Self-pay | Admitting: Family Medicine

## 2023-04-03 DIAGNOSIS — E119 Type 2 diabetes mellitus without complications: Secondary | ICD-10-CM

## 2023-04-03 MED ORDER — ROSUVASTATIN CALCIUM 40 MG PO TABS
40.0000 mg | ORAL_TABLET | Freq: Every day | ORAL | 1 refills | Status: DC
  Filled 2023-04-03: qty 90, 90d supply, fill #0

## 2023-04-03 MED ORDER — ROSUVASTATIN CALCIUM 40 MG PO TABS
40.0000 mg | ORAL_TABLET | Freq: Every day | ORAL | 1 refills | Status: DC
  Filled 2023-04-03 – 2023-04-09 (×2): qty 90, 90d supply, fill #0
  Filled 2024-01-30: qty 90, 90d supply, fill #1

## 2023-04-03 NOTE — Telephone Encounter (Signed)
Pt states he's been taking rosuvastatin (CRESTOR) 40 MG tablet and did not realize it had expired. He has been out for a couple days and is wondering if he can get a refill. Please advise.   Pharmacy [facility] - [facility] [address], [place] [address] Phone: [phone]  Fax: [phone]

## 2023-04-03 NOTE — Telephone Encounter (Signed)
Sent pt message letting know refill was sent.

## 2023-04-09 ENCOUNTER — Other Ambulatory Visit (HOSPITAL_COMMUNITY): Payer: Self-pay

## 2023-04-09 ENCOUNTER — Other Ambulatory Visit (HOSPITAL_BASED_OUTPATIENT_CLINIC_OR_DEPARTMENT_OTHER): Payer: Self-pay

## 2023-05-28 ENCOUNTER — Other Ambulatory Visit: Payer: Self-pay | Admitting: Family Medicine

## 2023-05-28 ENCOUNTER — Other Ambulatory Visit (HOSPITAL_COMMUNITY): Payer: Self-pay

## 2023-05-28 ENCOUNTER — Other Ambulatory Visit: Payer: Self-pay

## 2023-05-28 MED ORDER — MOUNJARO 15 MG/0.5ML ~~LOC~~ SOAJ
15.0000 mg | SUBCUTANEOUS | 1 refills | Status: DC
  Filled 2023-05-28: qty 2, 28d supply, fill #0
  Filled 2023-07-07: qty 2, 28d supply, fill #1

## 2023-07-07 ENCOUNTER — Other Ambulatory Visit (HOSPITAL_COMMUNITY): Payer: Self-pay

## 2023-08-07 ENCOUNTER — Other Ambulatory Visit (HOSPITAL_COMMUNITY): Payer: Self-pay

## 2023-08-07 ENCOUNTER — Other Ambulatory Visit: Payer: Self-pay

## 2023-08-07 ENCOUNTER — Other Ambulatory Visit: Payer: Self-pay | Admitting: Family Medicine

## 2023-08-07 MED ORDER — MOUNJARO 15 MG/0.5ML ~~LOC~~ SOAJ
15.0000 mg | SUBCUTANEOUS | 1 refills | Status: DC
  Filled 2023-08-07: qty 2, 28d supply, fill #0
  Filled 2023-09-10: qty 2, 28d supply, fill #1

## 2023-09-10 ENCOUNTER — Other Ambulatory Visit (HOSPITAL_COMMUNITY): Payer: Self-pay

## 2023-09-21 ENCOUNTER — Ambulatory Visit: Payer: Self-pay | Admitting: Family Medicine

## 2023-09-21 ENCOUNTER — Encounter: Payer: Self-pay | Admitting: Family Medicine

## 2023-09-21 ENCOUNTER — Ambulatory Visit (INDEPENDENT_AMBULATORY_CARE_PROVIDER_SITE_OTHER): Admitting: Family Medicine

## 2023-09-21 ENCOUNTER — Other Ambulatory Visit (HOSPITAL_BASED_OUTPATIENT_CLINIC_OR_DEPARTMENT_OTHER): Payer: Self-pay

## 2023-09-21 ENCOUNTER — Other Ambulatory Visit: Payer: Self-pay | Admitting: Family Medicine

## 2023-09-21 ENCOUNTER — Other Ambulatory Visit (HOSPITAL_COMMUNITY): Payer: Self-pay

## 2023-09-21 VITALS — BP 136/84 | HR 81 | Temp 98.0°F | Resp 16 | Ht 73.0 in | Wt 292.0 lb

## 2023-09-21 DIAGNOSIS — I10 Essential (primary) hypertension: Secondary | ICD-10-CM

## 2023-09-21 DIAGNOSIS — Z7985 Long-term (current) use of injectable non-insulin antidiabetic drugs: Secondary | ICD-10-CM | POA: Diagnosis not present

## 2023-09-21 DIAGNOSIS — Z1159 Encounter for screening for other viral diseases: Secondary | ICD-10-CM | POA: Diagnosis not present

## 2023-09-21 DIAGNOSIS — Z23 Encounter for immunization: Secondary | ICD-10-CM

## 2023-09-21 DIAGNOSIS — E1165 Type 2 diabetes mellitus with hyperglycemia: Secondary | ICD-10-CM | POA: Diagnosis not present

## 2023-09-21 DIAGNOSIS — Z13220 Encounter for screening for lipoid disorders: Secondary | ICD-10-CM | POA: Diagnosis not present

## 2023-09-21 LAB — COMPREHENSIVE METABOLIC PANEL WITH GFR
ALT: 31 U/L (ref 0–53)
AST: 19 U/L (ref 0–37)
Albumin: 4.3 g/dL (ref 3.5–5.2)
Alkaline Phosphatase: 60 U/L (ref 39–117)
BUN: 15 mg/dL (ref 6–23)
CO2: 32 meq/L (ref 19–32)
Calcium: 9.1 mg/dL (ref 8.4–10.5)
Chloride: 101 meq/L (ref 96–112)
Creatinine, Ser: 0.98 mg/dL (ref 0.40–1.50)
GFR: 92.93 mL/min (ref >60.00)
Glucose, Bld: 97 mg/dL (ref 70–99)
Potassium: 4.1 meq/L (ref 3.5–5.1)
Sodium: 138 meq/L (ref 135–145)
Total Bilirubin: 0.4 mg/dL (ref 0.2–1.2)
Total Protein: 7.2 g/dL (ref 6.0–8.3)

## 2023-09-21 LAB — MICROALBUMIN / CREATININE URINE RATIO
Creatinine,U: 106.8 mg/dL
Microalb Creat Ratio: 6.9 mg/g (ref 0.0–30.0)
Microalb, Ur: 0.7 mg/dL (ref 0.0–1.9)

## 2023-09-21 LAB — HEMOGLOBIN A1C: Hgb A1c MFr Bld: 6.7 % — ABNORMAL HIGH (ref 4.6–6.5)

## 2023-09-21 LAB — LIPID PANEL
Cholesterol: 187 mg/dL (ref 0–200)
HDL: 40.4 mg/dL (ref >39.00)
LDL Cholesterol: 136 mg/dL — ABNORMAL HIGH (ref 0–99)
NonHDL: 146.48
Total CHOL/HDL Ratio: 5
Triglycerides: 51 mg/dL (ref 0.0–149.0)
VLDL: 10.2 mg/dL (ref 0.0–40.0)

## 2023-09-21 MED ORDER — OLMESARTAN MEDOXOMIL-HCTZ 40-25 MG PO TABS
1.0000 | ORAL_TABLET | Freq: Every day | ORAL | 2 refills | Status: DC
  Filled 2023-09-21: qty 90, 90d supply, fill #0

## 2023-09-21 MED ORDER — OLMESARTAN-AMLODIPINE-HCTZ 40-10-25 MG PO TABS
1.0000 | ORAL_TABLET | Freq: Every day | ORAL | 3 refills | Status: DC
  Filled 2023-09-21: qty 90, 90d supply, fill #0

## 2023-09-21 MED ORDER — AMLODIPINE BESYLATE 10 MG PO TABS
10.0000 mg | ORAL_TABLET | Freq: Every day | ORAL | 1 refills | Status: AC
  Filled 2023-09-21 – 2024-02-28 (×2): qty 90, 90d supply, fill #0

## 2023-09-21 NOTE — Patient Instructions (Addendum)
 Give [place] 2-3 business days to get the results of your labs back.   Keep the diet clean and stay active.  For the muscle cramping, drink lots of fluids. Also take a spoonful of pickle juice nightly. An alternative would be a teaspoon of mustard, but most people prefer pickle juice.   Let us know if you need anything.

## 2023-09-21 NOTE — Progress Notes (Signed)
 Subjective:   Chief Complaint  Patient presents with   Medication Refill    Medication Check    [name] is a 48 y.o. male here for follow-up of diabetes.   [name] does not routinely check his sugars.  Patient does not require insulin.   Medications include: Mounjaro  15 mg/week Diet is OK.  Exercise: walking  Hypertension Patient presents for hypertension follow up. He does not monitor home blood pressures. He is compliant with medications- Norvasc  10 mg/d, hydrochlorothiazide  25 mg/d, olmesartan  40 mg/d. Patient has these side effects of medication: none Diet/exercise as above. No CP or SOB.   Past Medical History:  Diagnosis Date   Allergy    Anxiety    Depression    Diabetes mellitus without complication (HCC)    Hyperlipidemia    Hypertension    Hypothyroidism    Morbid (severe) obesity due to excess calories (HCC) 10/20/2022   bmi 42.06   Obesity    OSA (obstructive sleep apnea)    uses cpap   Sleep apnea      Related testing: Retinal exam: Done Pneumovax: Due  Objective:  BP 136/84 (BP Location: Left Arm, Patient Position: Sitting)   Pulse 81   Temp 98 F (36.7 C) (Oral)   Resp 16   Ht 6' 1 (1.854 m)   Wt 292 lb (132.5 kg)   SpO2 96%   BMI 38.52 kg/m  General:  Well developed, well nourished, in no apparent distress Lungs:  CTAB, no access msc use Cardio:  RRR, no bruits, no LE edema Psych: Age appropriate judgment and insight  Assessment:   Type 2 diabetes mellitus with hyperglycemia, without long-term current use of insulin (HCC) - Plan: Comprehensive metabolic panel with GFR, Microalbumin / creatinine urine ratio, Lipid panel, Hemoglobin A1c  Essential hypertension  Need for hepatitis B screening test - Plan: Hepatitis B surface antibody,quantitative   Plan:   Chronic, stable. Cont Mounjaro  15 mg/week. Counseled on diet and exercise. Chronic, stable. Will try to send in combo pill of amlodipine -olmesartan -hydrochlorothiazide   10-40-25 mg/d. He will let me know if there are cost issues. Screen. Tdap and Pcv20 updated today.  F/u in 6 mo. The patient voiced understanding and agreement to the plan.  Frank Mt [place], DO 09/21/23 9:42 AM

## 2023-09-22 LAB — HEPATITIS B SURFACE ANTIBODY, QUANTITATIVE: Hep B S AB Quant (Post): <5 m[IU]/mL — ABNORMAL LOW (ref >=10)

## 2023-10-02 ENCOUNTER — Other Ambulatory Visit (HOSPITAL_BASED_OUTPATIENT_CLINIC_OR_DEPARTMENT_OTHER): Payer: Self-pay

## 2023-10-16 ENCOUNTER — Other Ambulatory Visit: Payer: Self-pay | Admitting: Family Medicine

## 2023-10-17 ENCOUNTER — Other Ambulatory Visit (HOSPITAL_COMMUNITY): Payer: Self-pay

## 2023-10-17 MED ORDER — MOUNJARO 15 MG/0.5ML ~~LOC~~ SOAJ
15.0000 mg | SUBCUTANEOUS | 1 refills | Status: DC
  Filled 2023-10-17: qty 2, 28d supply, fill #0
  Filled 2023-11-24: qty 2, 28d supply, fill #1

## 2023-10-26 ENCOUNTER — Ambulatory Visit (INDEPENDENT_AMBULATORY_CARE_PROVIDER_SITE_OTHER): Admitting: Family Medicine

## 2023-10-26 VITALS — BP 158/86 | HR 78 | Temp 98.0°F | Resp 18 | Ht 73.0 in | Wt 290.8 lb

## 2023-10-26 DIAGNOSIS — Z0000 Encounter for general adult medical examination without abnormal findings: Secondary | ICD-10-CM

## 2023-10-26 NOTE — Patient Instructions (Addendum)
 Keep the diet clean and stay active.  Try to drink 55-60 oz of water daily outside of exercise.  Take Metamucil or Benefiber daily.  Try 2 tablespoons of milk of mag in 4 oz of warm prune juice. Do that and wait a couple hours. If no improvement, try a Dulcolax suppository and then let me know if we are still having issues.   Please get me a copy of your advanced directive form at your convenience.   Claritin  (loratadine ), Allegra (fexofenadine), Zyrtec (cetirizine) which is also equivalent to Xyzal  (levocetirizine); these are listed in order from weakest to strongest. Generic, and therefore cheaper, options are in the parentheses.   Flonase  (fluticasone ); nasal spray that is over the counter. 2 sprays each nostril, once daily. Aim towards the same side eye when you spray.  There are available OTC, and the generic versions, which may be cheaper, are in parentheses. Show this to a pharmacist if you have trouble finding any of these items.  Let us  know if you need anything.

## 2023-10-26 NOTE — Progress Notes (Signed)
 Chief Complaint  Patient presents with   Annual Exam    Well Male [name] is here for a complete physical.   His last physical was >1 year ago.  Current diet: in general, diet is OK, portions are down.   Current exercise: walking Weight trend: intentionally losing Fatigue out of ordinary? No. Seat belt? Yes.   Advanced directive? No  Health maintenance Tetanus- Yes HIV- Yes Hep C- Yes  Past Medical History:  Diagnosis Date   Allergy    Anxiety    Depression    Diabetes mellitus without complication (HCC)    Hyperlipidemia    Hypertension    Hypothyroidism    Morbid (severe) obesity due to excess calories (HCC) 10/20/2022   bmi 42.06   Obesity    OSA (obstructive sleep apnea)    uses cpap   Sleep apnea      Past Surgical History:  Procedure Laterality Date   HERNIA REPAIR  03/13/1988   Right ingruial & umbilical [place]    LEFT HEART CATHETERIZATION WITH CORONARY ANGIOGRAM N/A 09/26/2011   Procedure: LEFT HEART CATHETERIZATION WITH CORONARY ANGIOGRAM;  Surgeon: Erick JONELLE Bergamo, MD;  Location: [facility] CATH LAB;  Service: Cardiovascular;  Laterality: N/A;   Repair Right Arm Fracture  03/13/1994   [place]    UVULOPALATOPHARYNGOPLASTY (UPPP)/TONSILLECTOMY/SEPTOPLASTY  2010    Medications  Current Outpatient Medications on File Prior to Visit  Medication Sig Dispense Refill   albuterol  (VENTOLIN  HFA) 108 (90 Base) MCG/ACT inhaler Inhale 2 puffs into the lungs every 6 (six) hours as needed for wheezing or shortness of breath. 6.7 g 0   amLODipine  (NORVASC ) 10 MG tablet Take 1 tablet (10 mg total) by mouth daily. 90 tablet 1   aspirin  EC 81 MG tablet Take 81 mg by mouth. Only takes occasionally.     citalopram  (CELEXA ) 20 MG tablet Take 1 tablet (20 mg total) by mouth daily. 90 tablet 3   fluticasone  (FLONASE ) 50 MCG/ACT nasal spray PLACE 1 SPRAY INTO BOTH NOSTRILS 2 TIMES DAILY 16 g 6   levocetirizine (XYZAL ) 5 MG tablet Take 1 tablet (5 mg total) by mouth  every evening. 30 tablet 2   levothyroxine  (SYNTHROID ) 88 MCG tablet TAKE 1 TABLET BY MOUTH ONCE DAILY 90 tablet 2   meloxicam  (MOBIC ) 15 MG tablet Take 1 tablet (15 mg total) by mouth daily. 30 tablet 0   montelukast  (SINGULAIR ) 10 MG tablet Take 1 tablet (10 mg total) by mouth at bedtime. 30 tablet 3   olmesartan -hydrochlorothiazide  (BENICAR  HCT) 40-25 MG tablet Take 1 tablet by mouth daily. 90 tablet 2   rosuvastatin  (CRESTOR ) 40 MG tablet Take 1 tablet (40 mg total) by mouth daily. 90 tablet 1   tirzepatide  (MOUNJARO ) 15 MG/0.5ML Pen Inject 15 mg into the skin once a week. 2 mL 1   No current facility-administered medications on file prior to visit.     Allergies Allergies  Allergen Reactions   Atorvastatin  Other (See Comments)    Elevated liver enzymes   Lisinopril  Cough   Cymbalta [Duloxetine Hcl] Rash    Family History Family History  Problem Relation Age of Onset   Hypertension Mother    Hypertension Father    Heart attack Father    Diabetes Mellitus II Father    Down syndrome Brother    Kidney disease Maternal Grandfather    Breast cancer Paternal Grandmother    Diabetes Paternal Grandmother    Colon cancer Neg Hx    Colon polyps  Neg Hx    Esophageal cancer Neg Hx    Stomach cancer Neg Hx    Rectal cancer Neg Hx     Review of Systems: Constitutional: no fevers or chills Eye:  no recent significant change in vision Ear/Nose/Mouth/Throat:  Ears:  no hearing loss Nose/Mouth/Throat:  no complaints of nasal congestion, no sore throat Cardiovascular:  no chest pain Respiratory:  no shortness of breath Gastrointestinal:  no abdominal pain, no change in bowel habits GU:  Male: negative for dysuria, frequency, and incontinence Musculoskeletal/Extremities:  no pain of the joints Integumentary (Skin/Breast):  no abnormal skin lesions reported Neurologic:  no headaches Endocrine: No unexpected weight changes Hematologic/Lymphatic:  no night sweats  Exam BP (!)  158/86   Pulse 78   Temp 98 F (36.7 C)   Resp 18   Ht 6' 1 (1.854 m)   Wt 290 lb 12.8 oz (131.9 kg)   SpO2 98%   BMI 38.37 kg/m  General:  well developed, well nourished, in no apparent distress Skin:  no significant moles, warts, or growths Head:  no masses, lesions, or tenderness Eyes:  pupils equal and round, sclera anicteric without injection Ears:  canals without lesions, TMs shiny without retraction, no obvious effusion, no erythema Nose:  nares patent, mucosa normal Throat/Pharynx:  lips and gingiva without lesion; tongue and uvula midline; non-inflamed pharynx; no exudates or postnasal drainage Neck: neck supple without adenopathy, thyromegaly, or masses Lungs:  clear to auscultation, breath sounds equal bilaterally, no respiratory distress Cardio:  regular rate and rhythm, no bruits, no LE edema Abdomen:  abdomen soft, nontender; bowel sounds normal; no masses or organomegaly Rectal: Deferred Musculoskeletal:  symmetrical muscle groups noted without atrophy or deformity Extremities:  no clubbing, cyanosis, or edema, no deformities, no skin discoloration Neuro:  gait normal; deep tendon reflexes normal and symmetric Psych: well oriented with normal range of affect and appropriate judgment/insight  Assessment and Plan  Well adult exam   Well 48 y.o. male. Counseled on diet and exercise. Advanced directive form provided today.  Already had labs.  Hep B vaccination today. 2nd/final shot in 1 mo. Other orders as above. Follow up as originally scheduled. The patient voiced understanding and agreement to the plan.  Mabel Mt [place], DO 10/26/23 10:43 AM

## 2023-11-09 ENCOUNTER — Ambulatory Visit (INDEPENDENT_AMBULATORY_CARE_PROVIDER_SITE_OTHER)

## 2023-11-09 DIAGNOSIS — Z23 Encounter for immunization: Secondary | ICD-10-CM | POA: Diagnosis not present

## 2023-11-09 NOTE — Progress Notes (Signed)
 Patient here today for hep b vaccine , per Dr.Wendling. patient tolerated vaccine well.

## 2023-11-24 ENCOUNTER — Other Ambulatory Visit (HOSPITAL_COMMUNITY): Payer: Self-pay

## 2023-12-18 ENCOUNTER — Telehealth: Payer: Self-pay

## 2023-12-18 NOTE — Telephone Encounter (Signed)
 Patient return call to schedule Hep injection. Available appointment slots didn't  align with patient schedule. Patient states he was previously told he could walk in to have hep injection and didn't need an appointment. Please contact to confirm if this is okay.     CB#[phone]

## 2023-12-18 NOTE — Telephone Encounter (Signed)
 Copied from CRM #[id number]. Topic: Clinical - Request for Lab/Test Order >> Dec 18, 2023 10:56 AM [name] wrote: Reason for CRM: Patient is calling in to do Hep A shot. Please call [phone] for scheduling  Called pt back lvm for him to call office back to schedule Hep B for nurse visit.

## 2023-12-26 ENCOUNTER — Other Ambulatory Visit: Payer: Self-pay

## 2023-12-26 ENCOUNTER — Other Ambulatory Visit: Payer: Self-pay | Admitting: Family Medicine

## 2023-12-26 MED ORDER — MOUNJARO 15 MG/0.5ML ~~LOC~~ SOAJ
15.0000 mg | SUBCUTANEOUS | 1 refills | Status: DC
  Filled 2023-12-26: qty 2, 28d supply, fill #0
  Filled 2024-01-30: qty 2, 28d supply, fill #1

## 2024-01-30 ENCOUNTER — Other Ambulatory Visit: Payer: Self-pay | Admitting: Family Medicine

## 2024-01-30 ENCOUNTER — Other Ambulatory Visit: Payer: Self-pay

## 2024-01-30 ENCOUNTER — Other Ambulatory Visit (HOSPITAL_BASED_OUTPATIENT_CLINIC_OR_DEPARTMENT_OTHER): Payer: Self-pay

## 2024-01-30 DIAGNOSIS — F419 Anxiety disorder, unspecified: Secondary | ICD-10-CM

## 2024-01-30 MED ORDER — CITALOPRAM HYDROBROMIDE 20 MG PO TABS
20.0000 mg | ORAL_TABLET | Freq: Every day | ORAL | 3 refills | Status: AC
  Filled 2024-01-30: qty 90, 90d supply, fill #0

## 2024-02-28 ENCOUNTER — Other Ambulatory Visit (HOSPITAL_COMMUNITY): Payer: Self-pay

## 2024-02-28 ENCOUNTER — Other Ambulatory Visit: Payer: Self-pay | Admitting: Family Medicine

## 2024-02-28 ENCOUNTER — Other Ambulatory Visit: Payer: Self-pay

## 2024-02-28 DIAGNOSIS — E119 Type 2 diabetes mellitus without complications: Secondary | ICD-10-CM

## 2024-02-28 MED ORDER — MOUNJARO 15 MG/0.5ML ~~LOC~~ SOAJ
15.0000 mg | SUBCUTANEOUS | 1 refills | Status: DC
  Filled 2024-02-28: qty 2, 28d supply, fill #0

## 2024-02-28 MED FILL — Rosuvastatin Calcium Tab 40 MG: 40.0000 mg | ORAL | 90 days supply | Qty: 90 | Fill #0 | Status: CN

## 2024-03-07 ENCOUNTER — Other Ambulatory Visit (HOSPITAL_COMMUNITY): Payer: Self-pay

## 2024-03-28 ENCOUNTER — Encounter: Admitting: Family Medicine

## 2024-03-31 ENCOUNTER — Ambulatory Visit: Payer: Self-pay | Admitting: Family Medicine

## 2024-03-31 ENCOUNTER — Other Ambulatory Visit: Payer: Self-pay

## 2024-03-31 ENCOUNTER — Encounter (HOSPITAL_BASED_OUTPATIENT_CLINIC_OR_DEPARTMENT_OTHER): Payer: Self-pay | Admitting: Pharmacist

## 2024-03-31 ENCOUNTER — Other Ambulatory Visit (HOSPITAL_BASED_OUTPATIENT_CLINIC_OR_DEPARTMENT_OTHER): Payer: Self-pay

## 2024-03-31 ENCOUNTER — Encounter: Payer: Self-pay | Admitting: Family Medicine

## 2024-03-31 VITALS — BP 148/96 | HR 85 | Temp 98.0°F | Resp 16 | Ht 73.0 in | Wt 293.6 lb

## 2024-03-31 DIAGNOSIS — Z7984 Long term (current) use of oral hypoglycemic drugs: Secondary | ICD-10-CM | POA: Diagnosis not present

## 2024-03-31 DIAGNOSIS — E1165 Type 2 diabetes mellitus with hyperglycemia: Secondary | ICD-10-CM | POA: Diagnosis not present

## 2024-03-31 DIAGNOSIS — Z7985 Long-term (current) use of injectable non-insulin antidiabetic drugs: Secondary | ICD-10-CM

## 2024-03-31 DIAGNOSIS — I10 Essential (primary) hypertension: Secondary | ICD-10-CM | POA: Diagnosis not present

## 2024-03-31 DIAGNOSIS — E039 Hypothyroidism, unspecified: Secondary | ICD-10-CM

## 2024-03-31 DIAGNOSIS — Z23 Encounter for immunization: Secondary | ICD-10-CM | POA: Diagnosis not present

## 2024-03-31 MED ORDER — MOUNJARO 15 MG/0.5ML ~~LOC~~ SOAJ
15.0000 mg | SUBCUTANEOUS | 5 refills | Status: AC
  Filled 2024-03-31 (×2): qty 2, 28d supply, fill #0

## 2024-03-31 MED ORDER — OLMESARTAN MEDOXOMIL-HCTZ 40-25 MG PO TABS
1.0000 | ORAL_TABLET | Freq: Every day | ORAL | 2 refills | Status: AC
  Filled 2024-03-31: qty 30, 30d supply, fill #0
  Filled 2024-03-31 (×2): qty 90, 90d supply, fill #0

## 2024-03-31 MED ORDER — EMPAGLIFLOZIN 25 MG PO TABS
25.0000 mg | ORAL_TABLET | Freq: Every day | ORAL | 3 refills | Status: AC
  Filled 2024-03-31 (×2): qty 30, 30d supply, fill #0

## 2024-03-31 NOTE — Progress Notes (Signed)
 Subjective:   Chief Complaint  Patient presents with   Medication Refill    Medication Refill     [name] is a 49 y.o. male here for follow-up of diabetes.   Keagan does not routinely ck his sugars.  Patient does not require insulin.   Medications include: Mounjaro  15 mg/week Diet is fair.  Exercise: walking, strength training  Hypertension Patient presents for hypertension follow up. He does monitor home blood pressures. Blood pressures ranging on average from 130's/80's. He is compliant with medications-has not had Benicar  hydrochlorothiazide  40-25 mg/d; has been taking Norvasc  10 mg/d. Patient has these side effects of medication: none Diet/exercise as above. No Cp or SOB.   Past Medical History:  Diagnosis Date   Allergy    Anxiety    Depression    Diabetes mellitus without complication (HCC)    Hyperlipidemia    Hypertension    Hypothyroidism    Morbid (severe) obesity due to excess calories (HCC) 10/20/2022   bmi 42.06   Obesity    OSA (obstructive sleep apnea)    uses cpap   Sleep apnea      Related testing: Retinal exam: Done Pneumovax: done  Objective:  BP (!) 148/96 (BP Location: Left Arm, Cuff Size: Large)   Pulse 85   Temp 98 F (36.7 C) (Oral)   Resp 16   Ht 6' 1 (1.854 m)   Wt 293 lb 9.6 oz (133.2 kg)   SpO2 97%   BMI 38.74 kg/m  General:  Well developed, well nourished, in no apparent distress Lungs:  CTAB, no access msc use Cardio:  RRR, no bruits, no LE edema Psych: Age appropriate judgment and insight  Assessment:   Type 2 diabetes mellitus with hyperglycemia, without long-term current use of insulin (HCC) - Plan: Comprehensive metabolic panel with GFR, Lipid panel, Hemoglobin A1c, Microalbumin / creatinine urine ratio  Essential hypertension  Acquired hypothyroidism - Plan: T4, free, TSH  Need for hepatitis B vaccination - Plan: Heplisav-B  (HepB-CPG) Vaccine   Plan:   Chronic, stable. Cont Mounjaro  15 mg/d. Add  Jardiance  25 mg/d. Counseled on diet and exercise. Chronic, unstable. Add back Benicar  hct 40-25 mg/d, cont Norvasc  10 mg/d.  Monitor blood pressure at home over the next month.  If it does not normalize, he will let us  know. F/u in 6 mo. The patient voiced understanding and agreement to the plan.  Mabel Mt [place], DO 03/31/24 3:28 PM

## 2024-03-31 NOTE — Patient Instructions (Addendum)
 Give us  2-3 business days to get the results of your labs back.   Keep the diet clean and stay active.  Take Metamucil or Benefiber daily.  Try to stay hydrated earlier in the day.   Check your blood pressures 2-3 times per week, alternating the time of day you check it. If it is high, considering waiting 1-2 minutes and rechecking. If it gets higher, your anxiety is likely creeping up and we should avoid rechecking. Send me some readings in the next month.   Let us  know if you need anything.

## 2024-04-01 ENCOUNTER — Other Ambulatory Visit (HOSPITAL_COMMUNITY): Payer: Self-pay

## 2024-04-01 ENCOUNTER — Telehealth (HOSPITAL_COMMUNITY): Payer: Self-pay

## 2024-04-01 ENCOUNTER — Ambulatory Visit: Payer: Self-pay | Admitting: Family Medicine

## 2024-04-01 LAB — COMPREHENSIVE METABOLIC PANEL WITH GFR
ALT: 56 U/L — ABNORMAL HIGH (ref 3–53)
AST: 25 U/L (ref 5–37)
Albumin: 4.3 g/dL (ref 3.5–5.2)
Alkaline Phosphatase: 58 U/L (ref 39–117)
BUN: 17 mg/dL (ref 6–23)
CO2: 27 meq/L (ref 19–32)
Calcium: 9.6 mg/dL (ref 8.4–10.5)
Chloride: 102 meq/L (ref 96–112)
Creatinine, Ser: 1.15 mg/dL (ref 0.40–1.50)
GFR: 76.41 mL/min
Glucose, Bld: 82 mg/dL (ref 70–99)
Potassium: 4.5 meq/L (ref 3.5–5.1)
Sodium: 138 meq/L (ref 135–145)
Total Bilirubin: 0.3 mg/dL (ref 0.2–1.2)
Total Protein: 7.6 g/dL (ref 6.0–8.3)

## 2024-04-01 LAB — HEMOGLOBIN A1C: Hgb A1c MFr Bld: 6.6 % — ABNORMAL HIGH (ref 4.6–6.5)

## 2024-04-01 LAB — MICROALBUMIN / CREATININE URINE RATIO
Creatinine,U: 272.4 mg/dL
Microalb Creat Ratio: 12.6 mg/g (ref 0.0–30.0)
Microalb, Ur: 3.4 mg/dL — ABNORMAL HIGH (ref 0.7–1.9)

## 2024-04-01 LAB — T4, FREE: Free T4: 0.62 ng/dL (ref 0.60–1.60)

## 2024-04-01 LAB — LIPID PANEL
Cholesterol: 204 mg/dL — ABNORMAL HIGH (ref 28–200)
HDL: 36.4 mg/dL — ABNORMAL LOW
LDL Cholesterol: 147 mg/dL — ABNORMAL HIGH (ref 10–99)
NonHDL: 167.96
Total CHOL/HDL Ratio: 6
Triglycerides: 105 mg/dL (ref 10.0–149.0)
VLDL: 21 mg/dL (ref 0.0–40.0)

## 2024-04-01 LAB — TSH: TSH: 2.53 u[IU]/mL (ref 0.35–5.50)

## 2024-04-01 NOTE — Telephone Encounter (Signed)
 Pharmacy Patient Advocate Encounter   Received notification from Pt Calls Messages that prior authorization for Mounjaro  15MG /0.5ML auto-injectors  is required/requested.   Insurance verification completed.   The patient is insured through [employer].   Per test claim: PA required; PA submitted to above mentioned insurance via Latent Key/confirmation #/EOC [facility] Status is pending

## 2024-04-02 ENCOUNTER — Other Ambulatory Visit: Payer: Self-pay

## 2024-04-02 ENCOUNTER — Other Ambulatory Visit (HOSPITAL_BASED_OUTPATIENT_CLINIC_OR_DEPARTMENT_OTHER): Payer: Self-pay

## 2024-04-02 ENCOUNTER — Telehealth (HOSPITAL_BASED_OUTPATIENT_CLINIC_OR_DEPARTMENT_OTHER): Payer: Self-pay

## 2024-04-02 ENCOUNTER — Other Ambulatory Visit (HOSPITAL_COMMUNITY): Payer: Self-pay

## 2024-04-02 DIAGNOSIS — R748 Abnormal levels of other serum enzymes: Secondary | ICD-10-CM

## 2024-04-02 DIAGNOSIS — E7800 Pure hypercholesterolemia, unspecified: Secondary | ICD-10-CM

## 2024-04-02 NOTE — Telephone Encounter (Signed)
 Pharmacy Patient Advocate Encounter   Received notification from Pt Calls Messages that prior authorization for Jardiance  25MG  tablets  is required/requested.   Insurance verification completed.   The patient is insured through [employer].   Per test claim: PA required; PA submitted to above mentioned insurance via Latent Key/confirmation #/EOC [id number] Status is pending

## 2024-04-02 NOTE — Telephone Encounter (Signed)
 Pharmacy Patient Advocate Encounter  Received notification from [employer] that Prior Authorization for Mounjaro  15MG /0.5ML auto-injectors  has been APPROVED from 04/02/24 to 04/02/25. Ran test claim, Copay is $25. This test claim was processed through [facility]- copay amounts may vary at other pharmacies due to pharmacy/plan contracts, or as the patient moves through the different stages of their insurance plan.   PA #/Case ID/Reference #: [id number]

## 2024-04-02 NOTE — Telephone Encounter (Signed)
 Pharmacy Patient Advocate Encounter  Received notification from [employer] that Prior Authorization for Jardiance  25MG  tablets  has been APPROVED from 04/02/24 to 04/02/25. Ran test claim, Copay is $50. This test claim was processed through [facility]- copay amounts may vary at other pharmacies due to pharmacy/plan contracts, or as the patient moves through the different stages of their insurance plan.   PA #/Case ID/Reference #: [id number]

## 2024-04-04 ENCOUNTER — Other Ambulatory Visit (HOSPITAL_COMMUNITY): Payer: Self-pay

## 2024-05-16 ENCOUNTER — Other Ambulatory Visit

## 2024-10-31 ENCOUNTER — Encounter: Admitting: Family Medicine
